# Patient Record
Sex: Female | Born: 1935 | Race: White | Hispanic: No | Smoking: Never smoker
Health system: Southern US, Community
[De-identification: ages and names within clinical notes are randomized; demographics above are authoritative.]

## PROBLEM LIST (undated history)

## (undated) DIAGNOSIS — T8859XA Other complications of anesthesia, initial encounter: Secondary | ICD-10-CM

## (undated) DIAGNOSIS — T7840XA Allergy, unspecified, initial encounter: Secondary | ICD-10-CM

## (undated) DIAGNOSIS — Q2730 Arteriovenous malformation, site unspecified: Secondary | ICD-10-CM

## (undated) DIAGNOSIS — K449 Diaphragmatic hernia without obstruction or gangrene: Secondary | ICD-10-CM

## (undated) DIAGNOSIS — I10 Essential (primary) hypertension: Secondary | ICD-10-CM

## (undated) DIAGNOSIS — H9190 Unspecified hearing loss, unspecified ear: Secondary | ICD-10-CM

## (undated) DIAGNOSIS — K219 Gastro-esophageal reflux disease without esophagitis: Secondary | ICD-10-CM

## (undated) DIAGNOSIS — D509 Iron deficiency anemia, unspecified: Principal | ICD-10-CM

## (undated) DIAGNOSIS — M1990 Unspecified osteoarthritis, unspecified site: Secondary | ICD-10-CM

## (undated) DIAGNOSIS — Z87442 Personal history of urinary calculi: Secondary | ICD-10-CM

## (undated) DIAGNOSIS — K2980 Duodenitis without bleeding: Secondary | ICD-10-CM

## (undated) DIAGNOSIS — T4145XA Adverse effect of unspecified anesthetic, initial encounter: Secondary | ICD-10-CM

## (undated) DIAGNOSIS — D649 Anemia, unspecified: Secondary | ICD-10-CM

## (undated) DIAGNOSIS — K269 Duodenal ulcer, unspecified as acute or chronic, without hemorrhage or perforation: Secondary | ICD-10-CM

## (undated) HISTORY — DX: Essential (primary) hypertension: I10

## (undated) HISTORY — DX: Arteriovenous malformation, site unspecified: Q27.30

## (undated) HISTORY — DX: Anemia, unspecified: D64.9

## (undated) HISTORY — DX: Diaphragmatic hernia without obstruction or gangrene: K44.9

## (undated) HISTORY — PX: CHOLECYSTECTOMY: SHX55

## (undated) HISTORY — DX: Iron deficiency anemia, unspecified: D50.9

## (undated) HISTORY — DX: Duodenal ulcer, unspecified as acute or chronic, without hemorrhage or perforation: K26.9

## (undated) HISTORY — DX: Allergy, unspecified, initial encounter: T78.40XA

## (undated) HISTORY — PX: TONSILLECTOMY: SUR1361

## (undated) HISTORY — DX: Hypercalcemia: E83.52

## (undated) HISTORY — PX: CATARACT EXTRACTION: SUR2

## (undated) HISTORY — DX: Duodenitis without bleeding: K29.80

---

## 2004-06-12 LAB — HM COLONOSCOPY

## 2007-09-07 ENCOUNTER — Ambulatory Visit (HOSPITAL_COMMUNITY): Admission: RE | Admit: 2007-09-07 | Discharge: 2007-09-07 | Payer: Self-pay | Admitting: Obstetrics and Gynecology

## 2009-03-12 ENCOUNTER — Ambulatory Visit (HOSPITAL_COMMUNITY): Admission: RE | Admit: 2009-03-12 | Discharge: 2009-03-12 | Payer: Self-pay | Admitting: Obstetrics and Gynecology

## 2010-11-02 ENCOUNTER — Encounter: Payer: Self-pay | Admitting: Obstetrics and Gynecology

## 2011-04-16 ENCOUNTER — Encounter: Payer: Self-pay | Admitting: Family Medicine

## 2011-04-16 DIAGNOSIS — K449 Diaphragmatic hernia without obstruction or gangrene: Secondary | ICD-10-CM | POA: Insufficient documentation

## 2011-04-16 DIAGNOSIS — I10 Essential (primary) hypertension: Secondary | ICD-10-CM | POA: Insufficient documentation

## 2011-04-16 DIAGNOSIS — T7840XA Allergy, unspecified, initial encounter: Secondary | ICD-10-CM | POA: Insufficient documentation

## 2011-04-16 DIAGNOSIS — H269 Unspecified cataract: Secondary | ICD-10-CM | POA: Insufficient documentation

## 2011-08-22 ENCOUNTER — Observation Stay (HOSPITAL_COMMUNITY)
Admission: EM | Admit: 2011-08-22 | Discharge: 2011-08-24 | Disposition: A | Discharge status: Home / Self Care | Payer: Medicare Other | Attending: Internal Medicine | Admitting: Internal Medicine

## 2011-08-22 ENCOUNTER — Encounter (HOSPITAL_COMMUNITY): Payer: Self-pay | Admitting: *Deleted

## 2011-08-22 DIAGNOSIS — N840 Polyp of corpus uteri: Secondary | ICD-10-CM | POA: Insufficient documentation

## 2011-08-22 DIAGNOSIS — I10 Essential (primary) hypertension: Secondary | ICD-10-CM | POA: Insufficient documentation

## 2011-08-22 DIAGNOSIS — D509 Iron deficiency anemia, unspecified: Principal | ICD-10-CM | POA: Insufficient documentation

## 2011-08-22 DIAGNOSIS — K5900 Constipation, unspecified: Secondary | ICD-10-CM | POA: Insufficient documentation

## 2011-08-22 DIAGNOSIS — K449 Diaphragmatic hernia without obstruction or gangrene: Secondary | ICD-10-CM | POA: Insufficient documentation

## 2011-08-22 DIAGNOSIS — R42 Dizziness and giddiness: Secondary | ICD-10-CM | POA: Insufficient documentation

## 2011-08-22 DIAGNOSIS — M1990 Unspecified osteoarthritis, unspecified site: Secondary | ICD-10-CM | POA: Diagnosis present

## 2011-08-22 DIAGNOSIS — H269 Unspecified cataract: Secondary | ICD-10-CM | POA: Insufficient documentation

## 2011-08-22 DIAGNOSIS — D649 Anemia, unspecified: Secondary | ICD-10-CM

## 2011-08-22 DIAGNOSIS — M129 Arthropathy, unspecified: Secondary | ICD-10-CM | POA: Insufficient documentation

## 2011-08-22 LAB — CBC
HCT: 22.5 % — ABNORMAL LOW (ref 36.0–46.0)
Hemoglobin: 7.4 g/dL — ABNORMAL LOW (ref 12.0–15.0)
Hemoglobin: 6.8 g/dL — CL (ref 12.0–15.0)
MCH: 22.6 pg — ABNORMAL LOW (ref 26.0–34.0)
MCHC: 30.6 g/dL (ref 30.0–36.0)
MCHC: 30.2 g/dL (ref 30.0–36.0)
MCV: 74 fL — ABNORMAL LOW (ref 78.0–100.0)
RBC: 3.27 MIL/uL — ABNORMAL LOW (ref 3.87–5.11)
RBC: 3.03 MIL/uL — ABNORMAL LOW (ref 3.87–5.11)
RDW: 15.7 % — ABNORMAL HIGH (ref 11.5–15.5)
WBC: 6.4 10*3/uL (ref 4.0–10.5)

## 2011-08-22 LAB — COMPREHENSIVE METABOLIC PANEL
AST: 15 U/L (ref 0–37)
Albumin: 3.4 g/dL — ABNORMAL LOW (ref 3.5–5.2)
Alkaline Phosphatase: 78 U/L (ref 39–117)
BUN: 15 mg/dL (ref 6–23)
Calcium: 10.1 mg/dL (ref 8.4–10.5)
Chloride: 104 mEq/L (ref 96–112)
Creatinine, Ser: 0.78 mg/dL (ref 0.50–1.10)
Glucose, Bld: 119 mg/dL — ABNORMAL HIGH (ref 70–99)
Potassium: 3.6 mEq/L (ref 3.5–5.1)
Sodium: 140 mEq/L (ref 135–145)

## 2011-08-22 LAB — DIFFERENTIAL: Basophils Absolute: 0.1 10*3/uL (ref 0.0–0.1)

## 2011-08-22 LAB — PREPARE RBC (CROSSMATCH)

## 2011-08-22 LAB — PROTIME-INR
INR: 0.98 (ref 0.00–1.49)
Prothrombin Time: 13.2 seconds (ref 11.6–15.2)

## 2011-08-22 LAB — ABO/RH: ABO/RH(D): O POS

## 2011-08-22 LAB — OCCULT BLOOD, POC DEVICE: Fecal Occult Bld: NEGATIVE

## 2011-08-22 MED ORDER — PANTOPRAZOLE SODIUM 40 MG PO TBEC
40.0000 mg | DELAYED_RELEASE_TABLET | Freq: Every day | ORAL | Status: DC
  Administered 2011-08-23: 40 mg via ORAL
  Filled 2011-08-22 (×2): qty 1

## 2011-08-22 MED ORDER — SODIUM CHLORIDE 0.9 % IV SOLN
INTRAVENOUS | Status: AC
  Administered 2011-08-23: via INTRAVENOUS

## 2011-08-22 MED ORDER — ACETAMINOPHEN 325 MG PO TABS: 650.0000 mg | ORAL_TABLET | Freq: Four times a day (QID) | ORAL | Status: DC | PRN

## 2011-08-22 MED ORDER — ONDANSETRON HCL 4 MG/2ML IJ SOLN: 4.0000 mg | Freq: Four times a day (QID) | INTRAMUSCULAR | Status: DC | PRN

## 2011-08-22 MED ORDER — THERA M PLUS PO TABS
1.0000 | ORAL_TABLET | Freq: Every day | ORAL | Status: DC
  Administered 2011-08-23 – 2011-08-24 (×2): 1 via ORAL
  Filled 2011-08-22 (×4): qty 1

## 2011-08-22 MED ORDER — CALCIUM CARBONATE 600 MG PO TABS: 600.0000 mg | ORAL_TABLET | Freq: Two times a day (BID) | ORAL | Status: DC

## 2011-08-22 MED ORDER — MORPHINE SULFATE 2 MG/ML IJ SOLN: 1.0000 mg | INTRAMUSCULAR | Status: DC | PRN

## 2011-08-22 MED ORDER — ACETAMINOPHEN 325 MG PO TABS: 650.0000 mg | ORAL_TABLET | Freq: Once | ORAL | Status: DC

## 2011-08-22 MED ORDER — CALCIUM CARBONATE 1250 (500 CA) MG PO TABS
1000.0000 mg | ORAL_TABLET | Freq: Two times a day (BID) | ORAL | Status: DC
  Administered 2011-08-23 – 2011-08-24 (×3): 1000 mg via ORAL
  Filled 2011-08-22 (×5): qty 2

## 2011-08-22 MED ORDER — ONDANSETRON HCL 4 MG PO TABS: 4.0000 mg | ORAL_TABLET | Freq: Four times a day (QID) | ORAL | Status: DC | PRN

## 2011-08-22 MED ORDER — ACETAMINOPHEN 650 MG RE SUPP: 650.0000 mg | Freq: Four times a day (QID) | RECTAL | Status: DC | PRN

## 2011-08-22 MED ORDER — DOCUSATE SODIUM 100 MG PO CAPS
100.0000 mg | ORAL_CAPSULE | Freq: Two times a day (BID) | ORAL | Status: DC | PRN
  Filled 2011-08-22: qty 1

## 2011-08-22 MED ORDER — POLYETHYLENE GLYCOL 3350 17 G PO PACK
17.0000 g | PACK | Freq: Every day | ORAL | Status: DC
  Administered 2011-08-23 – 2011-08-24 (×3): 17 g via ORAL
  Filled 2011-08-22 (×3): qty 1

## 2011-08-22 NOTE — ED Notes (Signed)
Patient is resting comfortably. 

## 2011-08-22 NOTE — ED Provider Notes (Signed)
History     CSN: [id number] Arrival date & time: 08/22/2011 10:45 AM   First MD Initiated Contact with Patient 08/22/11 1108      Chief Complaint  Patient presents with  . Anemia  . Dizziness    (Consider location/radiation/quality/duration/timing/severity/associated sxs/prior treatment) HPI Comments: Patient is a rambling historian and difficult to keep on track.  Here after having been seen at PCP yesterday, had blood drawn and was notified today by lab that her HGB was low at 7. Something.  She states that she has a long history of dizziness related to inner ear problems, and denies abdominal pain, rectal bleeding, vaginal bleeding, headache, nausea, vomiting, fever, chills, chest pain or shortness of breath  Patient is a 76 y.o. female presenting with anemia. The history is provided by the patient and the spouse. No language interpreter was used.  Anemia This is a new problem. The current episode started today. The problem occurs constantly. The problem has been unchanged. Associated symptoms include vertigo and weakness. Pertinent negatives include no abdominal pain, anorexia, change in bowel habit, coughing, fatigue, headaches, nausea, numbness, rash, urinary symptoms or visual change. The symptoms are aggravated by nothing. She has tried nothing for the symptoms.    Past Medical History  Diagnosis Date  . Hypertension   . Allergy   . Hiatal hernia   . Anemia   . Cataract   . Diverticula of colon     Past Surgical History  Procedure Date  . Cholecystectomy   . Tonsillectomy     No family history on file.  History  Substance Use Topics  . Smoking status: Never Smoker   . Smokeless tobacco: Not on file  . Alcohol Use: No    OB History    Grav Para Term Preterm Abortions TAB SAB Ect Mult Living                  Review of Systems  Constitutional: Negative for fatigue.  HENT: Negative.   Eyes: Negative.   Respiratory: Negative.  Negative for cough.     Cardiovascular: Negative.   Gastrointestinal: Negative.  Negative for nausea, abdominal pain, anorexia and change in bowel habit.  Genitourinary: Negative.   Musculoskeletal: Negative.   Skin: Negative.  Negative for rash.  Neurological: Positive for vertigo and weakness. Negative for numbness and headaches.  Hematological: Negative.   Psychiatric/Behavioral: Negative.     Allergies  Keflex; Levaquin; and Other  Home Medications   Current Outpatient Rx  Name Route Sig Dispense Refill  . ASPIRIN 81 MG PO TABS Oral Take 81 mg by mouth daily.      Marland Kitchen CALCIUM CARBONATE 600 MG PO TABS Oral Take 600 mg by mouth 2 (two) times daily with a meal.      . DOCUSATE SODIUM 100 MG PO CAPS Oral Take 100 mg by mouth 2 (two) times daily as needed. For constipation     . METOPROLOL TARTRATE 50 MG PO TABS Oral Take 50 mg by mouth 2 (two) times daily.      Marland Kitchen POLYSACCHARIDE IRON 150 MG PO CAPS Oral Take 150 mg by mouth 2 (two) times daily.        BP 138/70  Pulse 83  Temp(Src) 98.2 F (36.8 C) (Oral)  Resp 20  SpO2 98%  Physical Exam  Constitutional: She is oriented to person, place, and time. She appears well-developed and well-nourished.  HENT:  Head: Normocephalic and atraumatic.  Right Ear: External ear normal.  Left Ear:  External ear normal.  Nose: Nose normal.  Eyes: EOM are normal. Pupils are equal, round, and reactive to light. No scleral icterus.       Pale conjunctiva  Neck: Normal range of motion. Neck supple.  Cardiovascular: Normal rate, regular rhythm and normal heart sounds.  Exam reveals no gallop and no friction rub.   No murmur heard. Pulmonary/Chest: Effort normal and breath sounds normal. No respiratory distress.  Abdominal: Soft. Bowel sounds are normal. She exhibits no distension.  Genitourinary: Rectal exam shows external hemorrhoid.  Musculoskeletal: Normal range of motion.  Neurological: She is alert and oriented to person, place, and time.  Skin: Skin is warm and  dry. She is not diaphoretic. There is pallor.  Psychiatric: She has a normal mood and affect. Her behavior is normal. Judgment and thought content normal.    ED Course  Procedures (including critical care time)   Labs Reviewed  CBC  DIFFERENTIAL  COMPREHENSIVE METABOLIC PANEL  OCCULT BLOOD X 1 CARD TO LAB, STOOL  TYPE AND SCREEN   No results found.   No diagnosis found.    MDM  Patient with pale conjunctiva, history of anemia - reports has had colonoscopy in past for review of this, denies any symptoms at this time - is on iron supplementation.  Will get labs, patient has forewarned me that she does not wish to have a transfusion at this time.        Izola Price [place], [place] 08/22/11 1243  Patient is symptomatic with anemia.  Unsure of the chronicity of this but feel that she should be admitted.  Patient has been seen with Dr. Denton Lank.  Will admit to medicine.  Izola Price [place], [place] 08/22/11 1506

## 2011-08-22 NOTE — ED Notes (Signed)
Admitting MD at the patient bedside 

## 2011-08-22 NOTE — ED Notes (Signed)
Pt was told to come to the emergency dept.  Pt had check up yesterday and had being having some dizziness.  Pt sts she has ear problems too.  Pt started iron pills 2 years ago.  No active bleeding that patient knows of.  Pt was told hemoglobin was in the 7s.

## 2011-08-22 NOTE — ED Notes (Signed)
Melanie [name] [phone]

## 2011-08-22 NOTE — H&P (Signed)
PCP:   Leo Grosser, MD, MD   Chief Complaint:  Dizziness, lightheadedness, abnormal labwork  HPI: 76 year old female with a past medical history significant for hypertension, hiatal hernia, arthritis, and history of colon diverticulosis; who came into the hospital secondary to abnormal lab work at primary care physician office (low hemoglobin level); and also symptoms of fatigue and dizziness. Of note patient denies chest pain, shortness of breath, abdominal pain, nausea/vomiting, hematemesis, hematochezia or melena. She also endorses that do to inner ear problems she has been feeling dizzy and woozy for many months now and his here because PCP office advise her to come for further evaluation and treatment.   Allergies:   Allergies  Allergen Reactions  . Keflex   . Levaquin   . Other     Another antibiotic that starts with cina?      Past Medical History  Diagnosis Date  . Hypertension   . Allergy   . Hiatal hernia   . Anemia   . Cataract   . Diverticula of colon     Past Surgical History  Procedure Date  . Cholecystectomy   . Tonsillectomy     Prior to Admission medications   Medication Sig Start Date End Date Taking? Authorizing Provider  aspirin 81 MG tablet Take 81 mg by mouth daily.     Yes Historical Provider, MD  calcium carbonate (OS-CAL) 600 MG TABS Take 600 mg by mouth 2 (two) times daily with a meal.     Yes Historical Provider, MD  docusate sodium (COLACE) 100 MG capsule Take 100 mg by mouth 2 (two) times daily as needed. For constipation    Yes Historical Provider, MD  metoprolol (LOPRESSOR) 50 MG tablet Take 50 mg by mouth 2 (two) times daily.     Yes Historical Provider, MD  polysaccharide iron (NIFEREX) 150 MG CAPS capsule Take 150 mg by mouth 2 (two) times daily.     Yes Historical Provider, MD    Social History:  reports that she has never smoked. She does not have any smokeless tobacco history on file. She reports that she does not drink alcohol  or use illicit drugs.  No family history on file.  Review of Systems:  Constitutional: Denies fever, chills, diaphoresis, appetite change and fatigue.  HEENT: Denies photophobia, eye pain, redness, hearing loss, ear pain, congestion, sore throat, rhinorrhea, sneezing, mouth sores, trouble swallowing, neck pain, neck stiffness and tinnitus.   Respiratory: Denies SOB, DOE, cough, chest tightness,  and wheezing.   Cardiovascular: Denies chest pain, palpitations and leg swelling.  Gastrointestinal: Denies nausea, vomiting, abdominal pain, diarrhea, constipation, blood in stool and abdominal distention.  Genitourinary: Denies dysuria, urgency, frequency, hematuria, flank pain and difficulty urinating.  Musculoskeletal: Denies myalgias, back pain, joint swelling, arthralgias and gait problem.  Skin: Denies pallor, rash and wound.  Neurological: Denies dizziness, seizures, syncope, weakness, light-headedness, numbness and headaches.  Hematological: Denies adenopathy. Easy bruising, personal or family bleeding history  Psychiatric/Behavioral: Denies suicidal ideation, mood changes, confusion, nervousness, sleep disturbance and agitation   Physical Exam: Blood pressure 145/75, pulse 87, temperature 98.2 F (36.8 C), temperature source Oral, resp. rate 13, SpO2 97.00%. GEN: Lying comfortable in bed, no acute distress, cooperative to examination. HEENT: Normocephalic, atraumatic, eyes with equally rounded pupils to light and accommodation; extraocular muscles intact. Pale conjunctiva, no akinesia, moist mucous membranes. Neck: No bruits, supple, no thyromegaly. Resp: Clear to auscultation bilaterally. Heart: S1 and S2 appreciated, regular rate and rhythm, no murmurs, no gallops or  rubs. Abdomen: Soft, nontender, nondistended, positive bowel sounds. Ext: Trace of edema bilaterally, no tenderness on palpation, no cyanosis or clubbing; good bilateral pedal pulses. Skin: No bruises, no rash or  petechiae. Neuro: Unaided awake and oriented x3,  CN 2-12 grossly intact, muscle strength 5 out of 5 bilaterally and symmetrically, no focal neurologic deficit.    Labs on Admission:  Results for orders placed during the hospital encounter of 08/22/11 (from the past 48 hour(s))  CBC     Status: Abnormal   Collection Time   08/22/11 12:05 PM      Component Value Range Comment   WBC 6.4  4.0 - 10.5 (K/uL)    RBC 3.27 (*) 3.87 - 5.11 (MIL/uL)    Hemoglobin 7.4 (*) 12.0 - 15.0 (g/dL)    HCT 78.2 (*) 95.6 - 46.0 (%)    MCV 74.0 (*) 78.0 - 100.0 (fL)    MCH 22.6 (*) 26.0 - 34.0 (pg)    MCHC 30.6  30.0 - 36.0 (g/dL)    RDW 21.3 (*) 08.6 - 15.5 (%)    Platelets 415 (*) 150 - 400 (K/uL)   DIFFERENTIAL     Status: Normal   Collection Time   08/22/11 12:05 PM      Component Value Range Comment   Neutrophils Relative 75  43 - 77 (%)    Lymphocytes Relative 14  12 - 46 (%)    Monocytes Relative 8  3 - 12 (%)    Eosinophils Relative 2  0 - 5 (%)    Basophils Relative 1  0 - 1 (%)    Neutro Abs 4.8  1.7 - 7.7 (K/uL)    Lymphs Abs 0.9  0.7 - 4.0 (K/uL)    Monocytes Absolute 0.5  0.1 - 1.0 (K/uL)    Eosinophils Absolute 0.1  0.0 - 0.7 (K/uL)    Basophils Absolute 0.1  0.0 - 0.1 (K/uL)    RBC Morphology POLYCHROMASIA PRESENT     COMPREHENSIVE METABOLIC PANEL     Status: Abnormal   Collection Time   08/22/11 12:05 PM      Component Value Range Comment   Sodium 140  135 - 145 (mEq/L)    Potassium 3.6  3.5 - 5.1 (mEq/L)    Chloride 104  96 - 112 (mEq/L)    CO2 25  19 - 32 (mEq/L)    Glucose, Bld 119 (*) 70 - 99 (mg/dL)    BUN 15  6 - 23 (mg/dL)    Creatinine, Ser 5.78  0.50 - 1.10 (mg/dL)    Calcium 46.9  8.4 - 10.5 (mg/dL)    Total Protein 6.9  6.0 - 8.3 (g/dL)    Albumin 3.4 (*) 3.5 - 5.2 (g/dL)    AST 15  0 - 37 (U/L)    ALT 8  0 - 35 (U/L)    Alkaline Phosphatase 78  39 - 117 (U/L)    Total Bilirubin 0.2 (*) 0.3 - 1.2 (mg/dL)    GFR calc non Af Amer 80 (*) >90 (mL/min)    GFR calc  Af Amer >90  >90 (mL/min)   TYPE AND SCREEN     Status: Normal   Collection Time   08/22/11 12:25 PM      Component Value Range Comment   ABO/RH(D) O POS      Antibody Screen NEG      Sample Expiration 08/25/2011     OCCULT BLOOD, POC DEVICE     Status: Normal  Collection Time   08/22/11 12:32 PM      Component Value Range Comment   Fecal Occult Bld NEGATIVE       Radiological Exams on Admission: No results found.   Assessment/Plan 1-Anemia: Appears to be a chronic problem and currently w/o signs of active bleeding (FOBT negative and normal BUN). She is hemodynamically stable and her Hgb is 7.4; will admit to telemetry, check orthostatic vital signs, provide gentle fluid resuscitation, repeat H/H tonight and CBC in the morning. Will also check a TSH, and anemia panel, and depending patient's results she might benefit of iron infusion and probably PRBC's transfusion. Depending her evolution will decide regarding consulting GI as an inpatient. But she definitely needs a follow up screening colonoscopy at some point.  2-Hypertension: Stable at this point. Due to concerns of active GI bleed, will hold off on antihypertensive drugs and follow her vital signs.  3-Hiatal hernia/with some gastroesophageal disease: Will use PPI.  4-Arthritis: Will use Tylenol for pain control.  5-Dizziness: maybe a chronic problems as stated by the patient inner ear disease (perphaps vertigo vs Meniere vs labyrinthitis); or due to her current anemia level. Will workup her anemia, get orthostatic vital signs and treat accordingly as needed for other causes.   Time Spent on Admission: 50 minutes  Leala Bryand 08/22/2011, 4:25 PM

## 2011-08-23 DIAGNOSIS — D509 Iron deficiency anemia, unspecified: Secondary | ICD-10-CM

## 2011-08-23 LAB — CBC
HCT: 27.8 % — ABNORMAL LOW (ref 36.0–46.0)
Hemoglobin: 8.6 g/dL — ABNORMAL LOW (ref 12.0–15.0)
MCH: 23.2 pg — ABNORMAL LOW (ref 26.0–34.0)
MCHC: 30.9 g/dL (ref 30.0–36.0)
MCV: 75.1 fL — ABNORMAL LOW (ref 78.0–100.0)
Platelets: 371 10*3/uL (ref 150–400)
RBC: 3.7 MIL/uL — ABNORMAL LOW (ref 3.87–5.11)
RDW: 16.2 % — ABNORMAL HIGH (ref 11.5–15.5)
WBC: 7 10*3/uL (ref 4.0–10.5)

## 2011-08-23 LAB — IRON AND TIBC
Iron: 12 ug/dL — ABNORMAL LOW (ref 42–135)
TIBC: 405 ug/dL (ref 250–470)
UIBC: 393 ug/dL (ref 125–400)

## 2011-08-23 LAB — FOLATE: Folate: 17.4 ng/mL

## 2011-08-23 LAB — FERRITIN: Ferritin: 2 ng/mL — ABNORMAL LOW (ref 10–291)

## 2011-08-23 LAB — TSH: TSH: 1.787 u[IU]/mL (ref 0.350–4.500)

## 2011-08-23 LAB — PREPARE RBC (CROSSMATCH)

## 2011-08-23 MED ORDER — FUROSEMIDE 10 MG/ML IJ SOLN
20.0000 mg | Freq: Once | INTRAMUSCULAR | Status: AC
  Administered 2011-08-23: 20 mg via INTRAVENOUS
  Filled 2011-08-23: qty 2

## 2011-08-23 NOTE — Progress Notes (Signed)
Informed pt that her Hgb had dropped to 6.8 and the provider on call had ordered for a transfusion of 1 unit PRBCs.  Pt stated that her dr would come and discuss this with her before the decision was made.  RN explained to pt that her primary MD was not in the building, and would not be in the building until tomorrow, and that the provider on call had ordered this.  She was very hesitant about the transfusion.  She ultimately refused to receive the blood until her MD discussed it with her further.  Refusal form signed, provider on call made aware.  Blood bank made aware.  Made pt aware that she could rescind her refusal tomorrow, if she wished.  Will monitor.

## 2011-08-23 NOTE — Consult Note (Signed)
Referring Provider: Mendez,CarlosPrimary Care Physician:  Leo Grosser, MD, MD Primary Gastroenterologist: Gentry Fitz  Reason for Consultation:  Severe microcytic anemia  HPI: Melanie [name] is a 76 y.o. female with history of hypertension osteoarthritis and previously documented diverticulosis. She apparently also has history of GERD. She is status post cholecystectomy. She and her husband moved here from [place] recently and she tells me she is under extreme personal and financial stress. She had requested refills on medications from her primary care provider and was told that she could not have the midline she came in for labs. At that time she was found to have a hemoglobin of 7.4 and MCV of 74 and admission was advised for transfusions. She had complained of lightheadedness but now on further questioning says she has had intermittent lightheadedness for a long time. She was found to be Hemoccult-negative on admission. Patient is a rambling historian, somewhat tangential it appears that she does have prior history of iron deficiency anemia and apparently had taken tandem in the past the last about 3 years ago. She said she stopped taking it because she couldn't afford it. She had not been on any recent iron supplementation. I cannot find any recent hemoglobins other than those done in the past few days. She denies any abdominal pain, says she has history of problems with constipation since she was born and manages this usually with Colace and do collapse and periodic enemas. Her appetite has been fair her weight has been stable she denies any problems with dysphagia or odynophagia. She has not noted any melena or hematochezia.  She relates that she had prior colonoscopy and [place] done in 2004 and was found to have hemorrhoids and diverticulosis. She says she had an upper endoscopy in 2003 around the same time that she had her gallbladder out but does not recall findings on that.  She does  take one baby aspirin daily prophylactically, no regular NSAIDs. Her family history is negative for colon cancer polyps as far she is aware  The patient does not want to stay in the hospital to undergo procedures and once to take go home to take care of her personal issues. She also relates that she had some sort of prolonged confusion after she had the anesthesia for her colonoscopy previously once to be sure that she has something different for sedation this time. She is agreeable to having her procedures done outpatient.    Past Medical History  Diagnosis Date  . Hypertension   . Allergy   . Hiatal hernia   . Anemia   . Cataract   . Diverticula of colon     Past Surgical History  Procedure Date  . Cholecystectomy   . Tonsillectomy     Prior to Admission medications   Medication Sig Start Date End Date Taking? Authorizing Provider  aspirin 81 MG tablet Take 81 mg by mouth daily.     Yes Historical Provider, MD  calcium carbonate (OS-CAL) 600 MG TABS Take 600 mg by mouth 2 (two) times daily with a meal.     Yes Historical Provider, MD  docusate sodium (COLACE) 100 MG capsule Take 100 mg by mouth 2 (two) times daily as needed. For constipation    Yes Historical Provider, MD  metoprolol (LOPRESSOR) 50 MG tablet Take 50 mg by mouth 2 (two) times daily.     Yes Historical Provider, MD  polysaccharide iron (NIFEREX) 150 MG CAPS capsule Take 150 mg by mouth 2 (two) times  daily.     Yes Historical Provider, MD    Current Facility-Administered Medications  Medication Dose Route Frequency Provider Last Rate Last Dose  . 0.9 %  sodium chloride infusion   Intravenous Continuous Vassie Loll, MD 75 mL/hr at 08/23/11 0002    . acetaminophen (TYLENOL) tablet 650 mg  650 mg Oral Q6H PRN Vassie Loll, MD       Or  . acetaminophen (TYLENOL) suppository 650 mg  650 mg Rectal Q6H PRN Vassie Loll, MD      . acetaminophen (TYLENOL) tablet 650 mg  650 mg Oral Once Rolan Lipa      .  calcium carbonate (OS-CAL - dosed in mg of elemental calcium) tablet 1,000 mg of elemental calcium  1,000 mg of elemental calcium Oral BID WC Mickeal Skinner, PHARMD   1,000 mg of elemental calcium at 08/23/11 0828  . docusate sodium (COLACE) capsule 100 mg  100 mg Oral BID PRN Vassie Loll, MD      . furosemide (LASIX) injection 20 mg  20 mg Intravenous Once Vassie Loll, MD      . morphine 2 MG/ML injection 1 mg  1 mg Intravenous Q4H PRN Vassie Loll, MD      . multivitamins ther. w/minerals tablet 1 tablet  1 tablet Oral Daily Vassie Loll, MD   1 tablet at 08/23/11 1000  . ondansetron (ZOFRAN) tablet 4 mg  4 mg Oral Q6H PRN Vassie Loll, MD       Or  . ondansetron [facility]) injection 4 mg  4 mg Intravenous Q6H PRN Vassie Loll, MD      . pantoprazole (PROTONIX) EC tablet 40 mg  40 mg Oral Q1200 Vassie Loll, MD      . polyethylene glycol (MIRALAX / GLYCOLAX) packet 17 g  17 g Oral Daily Vassie Loll, MD   17 g at 08/23/11 1026  . DISCONTD: calcium carbonate (OS-CAL) tablet 600 mg  600 mg Oral BID WC Vassie Loll, MD        Allergies as of 08/22/2011 - Review Complete 08/22/2011  Allergen Reaction Noted  . Keflex  08/22/2011  . Levaquin  04/16/2011  . Other  08/22/2011    Social history;, patient is married she and her husband recently relocated from [place] she is a nonsmoker nondrinker excellent Family history is negative for colon cancer polyps she says her mother did have diverticular disease.  Review of Systems: Gen: Denies any fever, chills, sweats, anorexia, fatigue, weakness, CV: Denies chest pain, angina, palpitations, syncope, orthopnea, PND, peripheral edema, and claudication. Resp: Denies dyspnea at rest, dyspnea with exercise, cough, sputum, wheezing, coughing up blood, and pleurisy. GI:  As in history of present illness GU : Denies urinary burning, blood in urine, urinary frequency, urinary hesitancy, nocturnal urination, and urinary incontinence. MS: Positive  for arthritic symptoms particularly in her hands and her hips Derm: Denies rash, itching, dry skin, hives, moles, warts, or unhealing ulcers.  Psych: Denies depression, anxiety, memory loss, suicidal ideation, hallucinations, paranoia, and confusion., He says she has been very stressed recently Heme: Denies bruising, bleeding, and enlarged lymph nodes. Neuro:  Denies any headaches, dizziness, paresthesias.   Physical Exam: Vital signs in last 24 hours: Temp:  [97.8 F (36.6 C)-99 F (37.2 C)] 98.1 F (36.7 C) (11/11 0945) Pulse Rate:  [78-103] 103  (11/11 0945) Resp:  [13-22] 18  (11/11 0945) BP: (122-148)/(63-82) 143/77 mmHg (11/11 0945) SpO2:  [95 %-100 %] 95 % (11/11 0516) Weight:  [90.4  kg (199 lb 4.7 oz)] 199 lb 4.7 oz (90.4 kg) (11/10 1810) Last BM Date: 08/21/11 General:   Alert,  Well-developed, well-nourished,  in NAD, somewhat argumentative Head:  Normocephalic and atraumatic. Eyes:  Sclera clear, no icterus.   Conjunctiva pale Ears:  Normal auditory acuity. Nose:  No deformity, discharge,  or lesions. Mouth:  No deformity or lesions.  Oropharynx pink & moist. Neck:  Supple; no masses or thyromegaly. Lungs:  Clear throughout to auscultation.   No wheezes, crackles, or rhonchi. No acute distress. Heart:  Regular rate and rhythm; no murmurs, clicks, rubs,  or gallops. Abdomen:  Soft, nontender and nondistended. No masses, hepatosplenomegaly or hernias noted. Normal bowel sounds, without guarding, and without rebound.   Rectal:  Deferred until time of colonoscopy.   Msk:  Symmetrical without gross deformities. . Pulses:  Normal pulses noted. Extremities:  Without clubbing or edema. Neurologic:  Alert and  oriented x4;  grossly normal neurologically. Skin:  Intact without significant lesions or rashes.  Psych:  Alert and cooperative. Normal mood and affect.  Intake/Output from previous day: 11/10 0701 - 11/11 0700 In: 372.5 [I.V.:372.5] Out: -  Intake/Output this  shift: Total I/O In: 490 [P.O.:240; I.V.:250] Out: -   Lab Results:  [facility] [place] 08/22/11 2158 08/22/11 1205  WBC 6.7 6.4  HGB 6.8* 7.4*  HCT 22.5* 24.2*  PLT 388 415*   BMET  Basename 08/22/11 1205  NA 140  K 3.6  CL 104  CO2 25  GLUCOSE 119*  BUN 15  CREATININE 0.78  CALCIUM 10.1   LFT  Basename 08/22/11 1205  PROT 6.9  ALBUMIN 3.4*  AST 15  ALT 8  ALKPHOS 78  BILITOT 0.2*  BILIDIR --  IBILI --   PT/INR  Basename 08/22/11 2158  LABPROT 13.2  INR 0.98       Studies/Results:  Impression:  #72 76 year old female admitted with severe microcytic anemia, currently Hemoccult negative. Patient relates relates prior history of iron deficiency anemia though she is uncertain of the cause. Will need to rule out occult colon lesion, gastric lesion, AVMs., Also consider celiac disease if endoscopic evaluation is negative. #2 History of diverticulosis and chronic constipation #3 History of GERD  #4 osteoarthritis #5 hypertension  Plan: #1 Agree with transfusions to get hemoglobin at least in the 8 range  #2 patient has a new prescription for Nu iron which she has started one twice daily,, and should continue for at least 3 months with plans for followup iron studies at that time I #3 We will or range for outpatient colonoscopy and upper endoscopy with Dr. Melvia Heaps, and will plan for propofol sedation. Procedures were discussed with the patient and her husband and they are agreeable to this plan our office will call her tomorrow after she is discharged to home.   Melanie [name]  08/23/2011, 10:41 AM  Chart was reviewed and patient was examined. X-rays were reviewed.    I agree with management and plans.  Barbette Hair. Arlyce Dice, M.D., [facility]

## 2011-08-23 NOTE — Progress Notes (Signed)
Subjective: Denies CP, SOB, palpitations or any bloody BM. Hgb is 6.8 today and patient appears pale on exam.  Objective: Vital signs in last 24 hours: Temp:  [97.8 F (36.6 C)-99 F (37.2 C)] 98.4 F (36.9 C) (11/11 0516) Pulse Rate:  [78-99] 93  (11/11 0516) Resp:  [13-22] 18  (11/11 0516) BP: (122-148)/(63-82) 126/70 mmHg (11/11 0516) SpO2:  [95 %-100 %] 95 % (11/11 0516) Weight:  [90.4 kg (199 lb 4.7 oz)] 199 lb 4.7 oz (90.4 kg) (11/10 1810) Weight change:  Last BM Date: 08/21/11  Intake/Output from previous day: 11/10 0701 - 11/11 0700 In: 372.5 [I.V.:372.5] Out: -      Physical Exam: General: Pale, Alert, awake, oriented x3, in no acute distress. HEENT: No bruits, no goiter. Pale conjunctiva. Heart: Regular rate and rhythm, without murmurs, rubs, gallops. Lungs: Clear to auscultation bilaterally. Abdomen: Soft, nontender, nondistended, positive bowel sounds. Extremities: No clubbing cyanosis or edema with positive pedal pulses. Neuro: Grossly intact, nonfocal.  Lab Results: Basic Metabolic Panel:  Basename 08/22/11 1205  NA 140  K 3.6  CL 104  CO2 25  GLUCOSE 119*  BUN 15  CREATININE 0.78  CALCIUM 10.1  MG --  PHOS --   Liver Function Tests:  Basename 08/22/11 1205  AST 15  ALT 8  ALKPHOS 78  BILITOT 0.2*  PROT 6.9  ALBUMIN 3.4*   No results found for this basename: LIPASE:2,AMYLASE:2 in the last 72 hours No results found for this basename: AMMONIA:2 in the last 72 hours CBC:  Basename 08/22/11 2158 08/22/11 1205  WBC 6.7 6.4  NEUTROABS -- 4.8  HGB 6.8* 7.4*  HCT 22.5* 24.2*  MCV 74.3* 74.0*  PLT 388 415*   Anemia Panel:  Basename 08/22/11 2158  VITAMINB12 --  FOLATE --  FERRITIN --  TIBC --  IRON --  RETICCTPCT 2.9   Coagulation:  Basename 08/22/11 2158  LABPROT 13.2  INR 0.98   Studies/Results: No results found.  Medications: Scheduled Meds:   . acetaminophen  650 mg Oral Once  . calcium carbonate  1,000 mg of  elemental calcium Oral BID WC  . furosemide  20 mg Intravenous Once  . multivitamins ther. w/minerals  1 tablet Oral Daily  . pantoprazole  40 mg Oral Q1200  . polyethylene glycol  17 g Oral Daily  . DISCONTD: calcium carbonate  600 mg Oral BID WC   Continuous Infusions:   . sodium chloride 75 mL/hr at 08/23/11 0002   PRN Meds:.acetaminophen, acetaminophen, docusate sodium, morphine, ondansetron (ZOFRAN) IV, ondansetron  Assessment/Plan: 1-Anemia: Hgb 6.8 today. Will transfuse 2 units of PRBC's and will consult GI to arrange colonoscopy either as inpatient vs outpatient. No palpitations, no SOB.  2-Hypertension: Stable at this point. Will continue holding her antihypertensives for now.  3-Hiatal hernia/with some gastroesophageal disease: Continue protonix.   4-Arthritis: continue tylenol as needed for pain.  5-Dizziness: Patient denying any dizziness today.  Will continue monitoring.   LOS: 1 day   Melanie [name] 08/23/2011, 8:24 AM

## 2011-08-23 NOTE — Progress Notes (Signed)
CRITICAL VALUE ALERT  Critical value received:  Hgb 6.8  Date of notification:  08-22-11  Time of notification:  2300  Critical value read back:yes  Nurse who received alert:  Landry Mellow, RN  MD notified (1st page):  Benedetto Coons, NP  Time of first page:  2305  MD notified (2nd page):  Time of second page:  Responding MD:  Benedetto Coons, NP  Time MD responded:  [phone]

## 2011-08-24 LAB — TYPE AND SCREEN
ABO/RH(D): O POS
Unit division: 0
Unit division: 0

## 2011-08-24 LAB — CBC
Hemoglobin: 9.1 g/dL — ABNORMAL LOW (ref 12.0–15.0)
MCH: 23.3 pg — ABNORMAL LOW (ref 26.0–34.0)
MCV: 75.7 fL — ABNORMAL LOW (ref 78.0–100.0)
RBC: 3.91 MIL/uL (ref 3.87–5.11)

## 2011-08-24 MED ORDER — POLYETHYLENE GLYCOL 3350 17 G PO PACK: 17.0000 g | PACK | Freq: Every day | ORAL | Status: AC

## 2011-08-24 MED ORDER — THERA M PLUS PO TABS: 1.0000 | ORAL_TABLET | Freq: Every day | ORAL | Status: DC

## 2011-08-24 MED ORDER — PANTOPRAZOLE SODIUM 40 MG PO TBEC: 40.0000 mg | DELAYED_RELEASE_TABLET | Freq: Every day | ORAL | Status: DC

## 2011-08-24 MED ORDER — ACETAMINOPHEN 325 MG PO TABS: 650.0000 mg | ORAL_TABLET | Freq: Four times a day (QID) | ORAL | Status: AC | PRN

## 2011-08-24 NOTE — Progress Notes (Signed)
Nsg Discharge Note  Admit Date:  08/22/2011 Discharge date: 08/24/2011   Dahlia Client to be D/C'd Home per MD order.   with the patient and all questions fully answered.  Discharge Medication:  Dahlia Client  Home Medication Instructions EAV:[id number]   Printed on:08/24/11 1215  Medication Information                    metoprolol (LOPRESSOR) 50 MG tablet Take 50 mg by mouth 2 (two) times daily.             calcium carbonate (OS-CAL) 600 MG TABS Take 600 mg by mouth 2 (two) times daily with a meal.             polysaccharide iron (NIFEREX) 150 MG CAPS capsule Take 150 mg by mouth 2 (two) times daily.             docusate sodium (COLACE) 100 MG capsule Take 100 mg by mouth 2 (two) times daily as needed. For constipation            acetaminophen (TYLENOL) 325 MG tablet Take 2 tablets (650 mg total) by mouth every 6 (six) hours as needed for pain (or Fever >/= 101).           Multiple Vitamins-Minerals (MULTIVITAMINS THER. W/MINERALS) TABS Take 1 tablet by mouth daily.           pantoprazole (PROTONIX) 40 MG tablet Take 1 tablet (40 mg total) by mouth daily at 12 noon.           polyethylene glycol (MIRALAX / GLYCOLAX) packet Take 17 g by mouth daily.             Discharge Assessment: Filed Vitals:   08/24/11 0535  BP: 143/81  Pulse: 76  Temp: 98.3 F (36.8 C)  Resp: 18   Skin clean, dry and intact without evidence of skin break down, no evidence of skin tears noted. IV catheter discontinued intact. Site without signs and symptoms of complications. Dressing and pressure applied.  D/c Instructions-Education: Discharge instructions given to patient/family with verbalized understanding. D/c education completed with patient/family including follow up instructions, medication list, d/c activities limitations if indicated, with other d/c instructions as indicated by MD - patient able to verbalize understanding, all questions fully answered. Patient instructed to  return to ED, call 911, or call MD for any changes in condition.  Patient escorted via WC, and D/C home via private auto.  Gwendalyn Ege, RN 08/24/2011 12:15 PM

## 2011-08-24 NOTE — Progress Notes (Signed)
Utilization Review Completed.  Melanie [name]  08/24/2011 

## 2011-08-24 NOTE — Discharge Summary (Signed)
Physician Discharge Summary  Patient ID: Dahlia Client MRN: [id number] DOB/AGE: [birth year] 74 y.o.  Admit date: 08/22/2011 Discharge date: 08/24/2011  Primary Care Physician:  Leo Grosser, MD, MD   Discharge Diagnoses:   1-Anemia(iron deficiency) 2-Dizziness 3-HTN 4-Hiatal hernia with hx of GERD 5-Arthritis 6-Diverticula of colon 7-Cataract 8-Chronic constipation  Present on Admission:  -Anemia: symptomatic (dizziness, fatigue and lightheaded). -HTN    Current Discharge Medication List    START taking these medications   Details  acetaminophen (TYLENOL) 325 MG tablet Take 2 tablets (650 mg total) by mouth every 6 (six) hours as needed for pain (or Fever >/= 101). Qty: 30 tablet, Refills: 0    Multiple Vitamins-Minerals (MULTIVITAMINS THER. W/MINERALS) TABS Take 1 tablet by mouth daily. Qty: 30 each    pantoprazole (PROTONIX) 40 MG tablet Take 1 tablet (40 mg total) by mouth daily at 12 noon. Qty: 30 tablet, Refills: 0    polyethylene glycol (MIRALAX / GLYCOLAX) packet Take 17 g by mouth daily. Qty: 14 each, Refills: 1      CONTINUE these medications which have NOT CHANGED   Details  calcium carbonate (OS-CAL) 600 MG TABS Take 600 mg by mouth 2 (two) times daily with a meal.      docusate sodium (COLACE) 100 MG capsule Take 100 mg by mouth 2 (two) times daily as needed. For constipation     metoprolol (LOPRESSOR) 50 MG tablet Take 50 mg by mouth 2 (two) times daily.      polysaccharide iron (NIFEREX) 150 MG CAPS capsule Take 150 mg by mouth 2 (two) times daily.        STOP taking these medications     aspirin 81 MG tablet      ferrous fumarate-iron polysaccharide complex (TANDEM) 162-115.2 MG CAPS          Disposition and Follow-up: Patient has been discharge in stable and improved condition; currently w/o dizziness, lighthead feeling, CP, SOB or any other complaints. At discharge her Hgb was 8.6 and stable. She will follow with Dr. Tanya Nones  over the next 5-7 days in order to have her Hgb recheck and will also follow with Dr. Arlyce Dice (GI) in order to have colonoscopy and EGD as an outpatient.   Consults:   GI (Dr. Arlyce Dice)   Significant Diagnostic Studies:  No results found.   Brief H and P: For complete details please refer to admission H and P, but in brief 76 y/o female with pmh of HTN, arthritis and colon diverticula; admitted secondary to symptomatic anemia. Hgb on admission 7.4 with subsequent drop to 6.8. Patient was having intermittent dizziness, lightheadness and fatigue. FOBT was negative on admission. Patient was admitted for further evaluation and treatment.   Hospital Course:  1-Anemia(iron deficiency):stable now after 2 units of PRBC's. Patient will continue iron pills as prescribed and will follow with GI as an outpatient for colonoscopy and endoscopy. 2-Dizziness- negative orthostatics and now w/o any complaints after transfusion. She will follow with PCP in 5-7 days. 3-HTN: Stable. Will continue current regimen. Patient advised to follow low sodium diet. 4-Hiatal hernia with hx of GERD: she was started on PPI. EGD as an outpatient per GI. 5-Arthritis: patient instructed to minimize/advoid use of NSAID and to use tylenol for pain control. 6-Diverticula of colon: No signs of acute bleeding. She is going to follow low residue diet and will have colonoscopy as an outpatient. 7-Cataract: Continue current medications. 8-Chronic constipation: She will continue miralax and colace; also will drink plenty of  water and will increase the amount of diet in her diet.  Time spent on Discharge: 35 minutes.  Signed: Cornelis Kluver 08/24/2011, 11:17 AM

## 2011-08-25 ENCOUNTER — Telehealth: Payer: Self-pay

## 2011-08-25 NOTE — Telephone Encounter (Signed)
Pt scheduled for previsit for ECL on 08/27/11@1pm , ECL with propofol scheduled for 08/31/11@2 :30pm. Pt has iron def. Anemia. Left message for pt to call back for appt dates and times.

## 2011-08-26 NOTE — Telephone Encounter (Signed)
Pt called back and states that she has made arrangements to be seen at a different office and her appts need to be cancelled. Dr. Arlyce Dice aware.

## 2011-08-27 NOTE — ED Provider Notes (Signed)
Medical screening examination/treatment/procedure(s) were conducted as a shared visit with non-physician practitioner(s) and myself.  I personally evaluated the patient during the encounter   Suzi Roots, MD 08/27/11 1416

## 2011-08-31 ENCOUNTER — Encounter: Payer: Medicare Other | Admitting: Gastroenterology

## 2011-10-16 ENCOUNTER — Ambulatory Visit (HOSPITAL_COMMUNITY): Admission: RE | Admit: 2011-10-16 | Payer: Medicare Other | Source: Ambulatory Visit | Admitting: Gastroenterology

## 2011-10-16 ENCOUNTER — Encounter (HOSPITAL_COMMUNITY): Admission: RE | Payer: Self-pay | Source: Ambulatory Visit

## 2011-10-16 SURGERY — EGD (ESOPHAGOGASTRODUODENOSCOPY)
Anesthesia: Moderate Sedation

## 2011-10-19 ENCOUNTER — Other Ambulatory Visit (HOSPITAL_COMMUNITY): Payer: Self-pay | Admitting: *Deleted

## 2011-10-20 ENCOUNTER — Encounter (HOSPITAL_COMMUNITY)
Admission: RE | Admit: 2011-10-20 | Discharge: 2011-10-20 | Disposition: A | Discharge status: Home / Self Care | Payer: Medicare Other | Source: Ambulatory Visit | Attending: Family Medicine | Admitting: Family Medicine

## 2011-10-20 DIAGNOSIS — D509 Iron deficiency anemia, unspecified: Secondary | ICD-10-CM | POA: Insufficient documentation

## 2011-10-20 LAB — TYPE AND SCREEN
ABO/RH(D): O POS
Unit division: 0

## 2011-10-20 LAB — PREPARE RBC (CROSSMATCH)

## 2011-10-21 ENCOUNTER — Encounter (HOSPITAL_COMMUNITY)
Admission: RE | Admit: 2011-10-21 | Discharge: 2011-10-21 | Disposition: A | Discharge status: Home / Self Care | Payer: Medicare Other | Source: Ambulatory Visit | Attending: Family Medicine | Admitting: Family Medicine

## 2011-10-21 NOTE — Progress Notes (Signed)
Lungs clear to auscultation bilaterally. 

## 2011-10-26 ENCOUNTER — Telehealth: Payer: Self-pay | Admitting: Hematology and Oncology

## 2011-10-26 NOTE — Telephone Encounter (Signed)
S/W PT RE APPT FOR 1/18 @ 9:30 AM.

## 2011-10-27 ENCOUNTER — Telehealth: Payer: Self-pay | Admitting: Hematology and Oncology

## 2011-10-27 NOTE — Telephone Encounter (Signed)
Referred by Dr. Lacretia Nicks. Melanie [name] Dx- IDA

## 2011-10-30 ENCOUNTER — Ambulatory Visit: Payer: Medicare Other | Admitting: Hematology and Oncology

## 2011-10-30 ENCOUNTER — Ambulatory Visit: Payer: Medicare Other

## 2011-11-02 ENCOUNTER — Telehealth: Payer: Self-pay | Admitting: Hematology and Oncology

## 2011-11-02 NOTE — Telephone Encounter (Signed)
Pt returned my call to r/s 1/18 appt. Pt given appt for 1/25 @ 9:30 am.

## 2011-11-02 NOTE — Telephone Encounter (Signed)
Returned pt's call re r/s 1/18 appt and lm for pt asking that she call me back to r/s. This is a new pt appt so i cannot add appt w/o confirming w/pt.

## 2011-11-06 ENCOUNTER — Ambulatory Visit (HOSPITAL_BASED_OUTPATIENT_CLINIC_OR_DEPARTMENT_OTHER): Payer: Medicare Other | Admitting: Hematology and Oncology

## 2011-11-06 ENCOUNTER — Ambulatory Visit (HOSPITAL_BASED_OUTPATIENT_CLINIC_OR_DEPARTMENT_OTHER): Payer: Medicare Other

## 2011-11-06 ENCOUNTER — Ambulatory Visit: Payer: Medicare Other

## 2011-11-06 ENCOUNTER — Telehealth: Payer: Self-pay | Admitting: Hematology and Oncology

## 2011-11-06 VITALS — BP 145/85 | HR 102 | Temp 99.0°F | Ht 63.5 in | Wt 196.6 lb

## 2011-11-06 DIAGNOSIS — D539 Nutritional anemia, unspecified: Secondary | ICD-10-CM

## 2011-11-06 DIAGNOSIS — D509 Iron deficiency anemia, unspecified: Secondary | ICD-10-CM

## 2011-11-06 LAB — CBC WITH DIFFERENTIAL/PLATELET
BASO%: 0.2 % (ref 0.0–2.0)
Basophils Absolute: 0 10*3/uL (ref 0.0–0.1)
EOS%: 3.6 % (ref 0.0–7.0)
HGB: 10.9 g/dL — ABNORMAL LOW (ref 11.6–15.9)
MCH: 23.3 pg — ABNORMAL LOW (ref 25.1–34.0)
RBC: 4.7 10*6/uL (ref 3.70–5.45)
RDW: 22.7 % — ABNORMAL HIGH (ref 11.2–14.5)
lymph#: 1.1 10*3/uL (ref 0.9–3.3)

## 2011-11-06 LAB — COMPREHENSIVE METABOLIC PANEL
ALT: 18 U/L (ref 0–35)
AST: 23 U/L (ref 0–37)
Albumin: 4.5 g/dL (ref 3.5–5.2)
BUN: 15 mg/dL (ref 6–23)
Calcium: 10 mg/dL (ref 8.4–10.5)
Chloride: 105 mEq/L (ref 96–112)
Potassium: 4.3 mEq/L (ref 3.5–5.3)
Sodium: 140 mEq/L (ref 135–145)
Total Protein: 6.9 g/dL (ref 6.0–8.3)

## 2011-11-06 LAB — FERRITIN: Ferritin: 10 ng/mL (ref 10–291)

## 2011-11-06 NOTE — Telephone Encounter (Signed)
sent pt to lab.  called IR and they will call pt with Biopsy appt.  informed pt

## 2011-11-06 NOTE — Progress Notes (Signed)
CC:   Melanie Manges, MD Melanie Hawks Elnoria Howard, MD  IDENTIFYING STATEMENT:  The patient is a 77 year old woman seen at the request of Dr. Tanya Nones with anemia.  HISTORY OF PRESENT ILLNESS:  The patient recently relocated to the [place] area from [place].  She was admitted between 08/22/2011 and 08/24/2011 for microcytic anemia.  She had complained of intermittent lightheadedness.  She was found to be occult negative on admission.  She received 4 units of packed red blood cells.  She does recall having been told that she had been iron deficient on and off for the last 2 years. She was placed on iron but has been taking this intermittently over the years.  Following her admission, she resumed oral iron.  She describes a well-balanced diet with stable weight.  The patient was referred to Dr. Jeani Hawking as an outpatient.  On 09/15/2011, he performed an upper endoscopy that revealed a hiatal hernia.  Duodenal biopsies were obtained and showed no evidence of celiac sprue.  A colonoscopy on 10/01/2011 had shown diverticula in the descending and sigmoid colon.  A 3 mm, discrete, erythematous patch was found in the ascending colon.  However, Dr. Elnoria [name] felt it was not an AVM.  There was no evidence of inflammation, ulcerations, erosions, polyps, or masses.  Medium hemorrhoids were noted.  The patient was subsequently scheduled for capsule endoscopy to examine the small bowel, but she declined.  Dr. Tanya Nones had performed blood work in his office.  Results are as follows:   On 09/01/2011, white cell count 4.7, hemoglobin 9.5, hematocrit 32.5, platelets 430.  On 09/18/2011, white cell count 5, hemoglobin 8.5, hematocrit 28.7, platelets 443.  On 08/16/2011, white count 4.6, hemoglobin 7.8, hematocrit 25.8, platelets 408.  She is referred to Hematology for further evaluation.  PAST MEDICAL HISTORY: 1. Hypertension. 2. Hiatal hernia. 3. GERD. 4. Status post bilateral cataract extraction. 5.  Status post cholecystectomy. 6. History of allergic rhinitis.  ALLERGIES: 1. CODEINE. 2. KEFLEX. 3. LEVAQUIN. 4. MORPHINE. 5. SULFA.  SOCIAL HISTORY:  The patient is married with 3 children.  She is a housewife.  She denies alcohol or tobacco use.  FAMILY HISTORY:  Negative for oncologic or hematologic malignancies.  HEALTH MAINTENANCE:  Colonoscopy as noted above.  MEDICATIONS: 1. Ferrex 150 mg b.i.d. 2. Aspirin 81 mg daily. 3. Calcium with vitamin D. 4. Stool softener. 5. Nystatin p.r.n. 6. Multivitamins.  REVIEW OF SYSTEMS:  Constitutional:  She denies fever, chills, night sweats, anorexia, and weight loss.  GI:  She denies nausea, vomiting, abdominal pain, diarrhea, melena, or hematochezia.  GU:  She denies dysuria, hematuria, nocturia, and frequency.  Cardiovascular:  She denies chest pain, PND, orthopnea, and ankle swelling.  Respirations: She denies cough, hemoptysis, wheeze, and shortness of breath. Neurologic:  She denies headache, vision changes, and extremity weakness.  The rest of the review of systems is negative.  PHYSICAL EXAMINATION:  General Appearance:  The patient is alert and oriented x3.  Vital Signs:  Pulse 102.  Blood pressure 145/85. Temperature 99.  Respirations 20.  Weight 196 pounds.  HEENT:  Head is atraumatic, normocephalic.  Sclerae are anicteric.  Mouth is moist. Neck:  Supple.  Chest:  Clear.  CVS:  First and second heart sounds present.  Abdomen:  Soft, nontender.  Bowel sounds present. Extremities:  No edema.  Pulses are present and symmetrical.  Lymph Nodes:  No adenopathy.  CNS:  Nonfocal.  IMPRESSION AND PLAN:  [name] is a 77 year old  woman with a history of iron-deficiency anemia.  She has had a colonoscopy and endoscopy which were both unremarkable.  She has refused capsule endoscopy. She is considering a second gastrointestinal consultation at a tertiary hospital.  We spent some time discussing the possible etiologies  for iron-deficiency anemia.  She was told that she may have an issue with her small bowel and, thus, it was imperative that she undergo, at some point, a small bowel exam as this could be a source of  gastrointestinal bleed.  From the hematologic standpoint, we will repeat some  Blood, especially iron stores.  If her iron levels are low, she would be a candidate for intravenous iron in the form of Feraheme.  She was told that she may require this intermittently until the source of blood loss was identified and treated. We discussed logistics and side effects of therapy.  In addition to obtaining an anemia panel, we will repeat her CBC, review morphology, and rule out hemolysis with a Coombs, haptoglobin, and LDH.  She will obtain urinalysis and serum protein electrophoresis to rule out plasma cell dyscrasia.  She needs to undergo a bone marrow biopsy with aspiration. She will be referred to IR.  S The patient returns to discuss results.  I spent more than half the time coordinating care.    ______________________________ Laurice Record, M.D. LIO/MEDQ  D:  11/06/2011  T:  11/06/2011  Job:  [id number]

## 2011-11-06 NOTE — Progress Notes (Signed)
Dr.     Lynnea Ferrier     -     Primary  @  [facility]. Dr.    Chip Boer     -      GI  [facility]    On  [address]  NO  CELL   PHONE.

## 2011-11-06 NOTE — Progress Notes (Signed)
This office note has been dictated.

## 2011-11-09 ENCOUNTER — Other Ambulatory Visit: Payer: Self-pay | Admitting: Radiology

## 2011-11-10 ENCOUNTER — Telehealth: Payer: Self-pay | Admitting: Hematology and Oncology

## 2011-11-10 ENCOUNTER — Other Ambulatory Visit: Payer: Self-pay | Admitting: Hematology and Oncology

## 2011-11-10 DIAGNOSIS — D649 Anemia, unspecified: Secondary | ICD-10-CM

## 2011-11-10 NOTE — Telephone Encounter (Signed)
S/w pt today re appt for 2/13

## 2011-11-12 ENCOUNTER — Ambulatory Visit (HOSPITAL_COMMUNITY)
Admission: RE | Admit: 2011-11-12 | Discharge: 2011-11-12 | Disposition: A | Discharge status: Home / Self Care | Payer: Medicare Other | Source: Ambulatory Visit | Attending: Hematology and Oncology | Admitting: Hematology and Oncology

## 2011-11-12 ENCOUNTER — Encounter (HOSPITAL_COMMUNITY): Payer: Self-pay

## 2011-11-12 DIAGNOSIS — D649 Anemia, unspecified: Secondary | ICD-10-CM | POA: Insufficient documentation

## 2011-11-12 DIAGNOSIS — D539 Nutritional anemia, unspecified: Secondary | ICD-10-CM

## 2011-11-12 DIAGNOSIS — Z79899 Other long term (current) drug therapy: Secondary | ICD-10-CM | POA: Insufficient documentation

## 2011-11-12 DIAGNOSIS — I10 Essential (primary) hypertension: Secondary | ICD-10-CM | POA: Insufficient documentation

## 2011-11-12 HISTORY — DX: Unspecified osteoarthritis, unspecified site: M19.90

## 2011-11-12 LAB — PROTIME-INR: Prothrombin Time: 13.2 seconds (ref 11.6–15.2)

## 2011-11-12 LAB — CBC
MCH: 22.3 pg — ABNORMAL LOW (ref 26.0–34.0)
MCHC: 30.5 g/dL (ref 30.0–36.0)
Platelets: 350 10*3/uL (ref 150–400)
RDW: 20.4 % — ABNORMAL HIGH (ref 11.5–15.5)

## 2011-11-12 MED ORDER — MIDAZOLAM HCL 5 MG/5ML IJ SOLN
INTRAMUSCULAR | Status: AC | PRN
  Administered 2011-11-12 (×2): 1 mg via INTRAVENOUS

## 2011-11-12 MED ORDER — SODIUM CHLORIDE 0.9 % IV SOLN
Freq: Once | INTRAVENOUS | Status: AC
  Administered 2011-11-12: 08:00:00 via INTRAVENOUS

## 2011-11-12 MED ORDER — FENTANYL CITRATE 0.05 MG/ML IJ SOLN
INTRAMUSCULAR | Status: AC | PRN
  Administered 2011-11-12: 100 ug via INTRAVENOUS
  Administered 2011-11-12: 50 ug via INTRAVENOUS

## 2011-11-12 NOTE — H&P (Signed)
Melanie [name] is an 77 y.o. female.   Chief Complaint: "I'm here for a bone marrow biopsy" HPI: Patient with history of anemia presents today for CT guided bone marrow biopsy.  Past Medical History  Diagnosis Date  . Hypertension   . Allergy   . Hiatal hernia   . Anemia   . Cataract   . Diverticula of colon   . Arthritis     right hip is most painful and affects mobility as far as ambulating steps    Past Surgical History  Procedure Date  . Cholecystectomy   . Tonsillectomy   . Cataract extraction     bilat 2008    No family history on file. Social History:  reports that she has never smoked. She does not have any smokeless tobacco history on file. She reports that she does not drink alcohol or use illicit drugs.  Allergies:  Allergies  Allergen Reactions  . Codeine Nausea And Vomiting  . Keflex Other (See Comments)    Affected pt's child -  Therefore , pt does not take Keflex.  . Levaquin Other (See Comments)    Hallucinations.  . Morphine And Related Nausea And Vomiting  . Other     Another antibiotic that starts with cina?  . Quinolones Other (See Comments)    Hallucinations.  . Sulfa Antibiotics Nausea And Vomiting  . Allegra Other (See Comments)    unknown reaction  . Amoxicillin Nausea Only  . Floxin (Ocuflox) Other (See Comments)    unknown allergy  . Neurontin (Gabapentin) Palpitations  . Septra (Bactrim) Other (See Comments)    Unknown reaction    Medications Prior to Admission  Medication Sig Dispense Refill  . aspirin 81 MG tablet Take 81 mg by mouth daily.       . Calcium Carbonate-Vitamin D (CALCIUM 600/VITAMIN D) 600-400 MG-UNIT per chew tablet Chew 1 tablet by mouth 2 (two) times daily.      Marland Kitchen docusate sodium (COLACE) 100 MG capsule Take 200 mg by mouth daily before breakfast.       . metoprolol (LOPRESSOR) 50 MG tablet Take 50 mg by mouth 2 (two) times daily.        . Multiple Vitamin (MULITIVITAMIN WITH MINERALS) TABS Take 1 tablet by mouth  daily at 12 noon. Spectrative Chewable      . nystatin (MYCOSTATIN) 100000 UNIT/ML suspension Take 500,000 Units by mouth 4 (four) times daily.       . polysaccharide iron (NIFEREX) 150 MG CAPS capsule Take 150 mg by mouth 2 (two) times daily.        . fluticasone (CUTIVATE) 0.05 % cream Apply 1 application topically 2 (two) times daily as needed. To affected area      . tobramycin (TOBREX) 0.3 % ophthalmic solution Place 1 drop into both eyes every 4 (four) hours. For irritated eyes       Medications Prior to Admission  Medication Dose Route Frequency Provider Last Rate Last Dose  . 0.9 %  sodium chloride infusion   Intravenous Once Robet Leu, PA 20 mL/hr at 11/12/11 0746      Results for orders placed during the hospital encounter of 11/12/11 (from the past 48 hour(s))  APTT     Status: Normal   Collection Time   11/12/11  7:45 AM      Component Value Range Comment   aPTT 32  24 - 37 (seconds)   CBC     Status: Abnormal  Collection Time   11/12/11  7:45 AM      Component Value Range Comment   WBC 5.3  4.0 - 10.5 (K/uL)    RBC 4.52  3.87 - 5.11 (MIL/uL)    Hemoglobin 10.1 (*) 12.0 - 15.0 (g/dL)    HCT 16.1 (*) 09.6 - 46.0 (%)    MCV 73.2 (*) 78.0 - 100.0 (fL)    MCH 22.3 (*) 26.0 - 34.0 (pg)    MCHC 30.5  30.0 - 36.0 (g/dL)    RDW 04.5 (*) 40.9 - 15.5 (%)    Platelets 350  150 - 400 (K/uL)   PROTIME-INR     Status: Normal   Collection Time   11/12/11  7:45 AM      Component Value Range Comment   Prothrombin Time 13.2  11.6 - 15.2 (seconds)    INR 0.98  0.00 - 1.49     No results found.  Review of Systems  Constitutional: Negative for fever.  Respiratory: Negative for cough and shortness of breath.   Cardiovascular: Negative for chest pain.  Gastrointestinal: Negative for nausea, vomiting and abdominal pain.  Neurological: Negative for headaches.  Endo/Heme/Allergies: Does not bruise/bleed easily.    Blood pressure 156/80, pulse 80, temperature 98.8 F (37.1 C),  temperature source Oral, resp. rate 16, height 5\' 4"  (1.626 m), weight 196 lb (88.905 kg), SpO2 98.00%. Physical Exam  Constitutional: She is oriented to person, place, and time. She appears well-developed and well-nourished.  Cardiovascular: Normal rate and regular rhythm.   Respiratory: Effort normal and breath sounds normal.  GI: Soft. Bowel sounds are normal. There is no tenderness.  Musculoskeletal: Normal range of motion.  Neurological: She is alert and oriented to person, place, and time.     Assessment/Plan Patient with history of anemia; plan is for CT guided bone marrow biopsy.  ALLRED,D KEVIN 11/12/2011, 9:02 AM

## 2011-11-12 NOTE — Procedures (Signed)
CT guided bone marrow biopsy.  No immediate complication.   

## 2011-11-25 ENCOUNTER — Telehealth: Payer: Self-pay | Admitting: Hematology and Oncology

## 2011-11-25 ENCOUNTER — Ambulatory Visit (HOSPITAL_BASED_OUTPATIENT_CLINIC_OR_DEPARTMENT_OTHER): Payer: Medicare Other | Admitting: Hematology and Oncology

## 2011-11-25 ENCOUNTER — Other Ambulatory Visit (HOSPITAL_BASED_OUTPATIENT_CLINIC_OR_DEPARTMENT_OTHER): Payer: Medicare Other | Admitting: Lab

## 2011-11-25 VITALS — BP 141/73 | HR 68 | Temp 97.8°F | Ht 64.0 in | Wt 196.5 lb

## 2011-11-25 DIAGNOSIS — D649 Anemia, unspecified: Secondary | ICD-10-CM

## 2011-11-25 DIAGNOSIS — D509 Iron deficiency anemia, unspecified: Secondary | ICD-10-CM

## 2011-11-25 DIAGNOSIS — D539 Nutritional anemia, unspecified: Secondary | ICD-10-CM

## 2011-11-25 LAB — CBC WITH DIFFERENTIAL/PLATELET
Basophils Absolute: 0 10*3/uL (ref 0.0–0.1)
Eosinophils Absolute: 0.2 10*3/uL (ref 0.0–0.5)
HGB: 11 g/dL — ABNORMAL LOW (ref 11.6–15.9)
LYMPH%: 26.1 % (ref 14.0–49.7)
MCV: 74.3 fL — ABNORMAL LOW (ref 79.5–101.0)
MONO%: 8.4 % (ref 0.0–14.0)
NEUT#: 3.5 10*3/uL (ref 1.5–6.5)
Platelets: 321 10*3/uL (ref 145–400)
RBC: 4.65 10*6/uL (ref 3.70–5.45)

## 2011-11-25 LAB — URINALYSIS, MICROSCOPIC - CHCC
Blood: NEGATIVE
Nitrite: NEGATIVE
Protein: NEGATIVE mg/dL
pH: 7 (ref 4.6–8.0)

## 2011-11-25 NOTE — Progress Notes (Signed)
CC:   Claude Manges, MD [name], MD  IDENTIFYING STATEMENT:  Patient is a 77 year old woman who presents to discuss results.  INTERVAL HISTORY:  In summary, the patient has had chronic anemia.  She has been on and off oral iron over last couple of years.  She had required several units of packed red blood cells for symptomatic anemia in November 2012.  She had undergone a GI workup that included endoscopy and colonoscopy that revealed a hiatal hernia, otherwise unremarkable. She declined a small bowel exam.  Results of her recent workup are as follows:  11/06/2011 white cell count 5.8, hemoglobin 10.9, hematocrit 34.5, platelets 374; differential unremarkable; review of peripheral smear revealed normal-appearing white blood cells, red blood cells, and platelets.  BUN and creatinine 15 and 0.92 respectively; B12 520; folate greater than 20; ferritin was 12, iron 23, TIBC 491, saturation 5%.  A bone marrow exam on 11/12/2011 revealed a hypercellular bone marrow with trilineage hematopoiesis.  Erythroid precursors revealed only a progressive maturation.  Storage iron was absent.  Ring sideroblasts were absent.    MEDICATIONS REVIEWED AND UPDATED:  Nu-Iron 1 tablet daily.  ALLERGIES:  Codeine, Keflex, Levaquin, morphine, sulfa.  PAST MEDICAL HISTORY:  Unchanged.  FAMILY HISTORY:  Unchanged.  SOCIAL HISTORY:  Unchanged.  REVIEW OF SYSTEMS:  Besides minimal fatigue, has no other complaints. Rest of review of systems negative.  PHYSICAL EXAM:  General:  The patient is alert and oriented x3.  Vitals: Pulse 68, blood pressure 141/73, temperature 97.8, respirations 18, weight 196 pounds.  HEENT:  Head is atraumatic, normocephalic.  Eyes: Sclerae anicteric.  Mouth:  Moist.  Chest:  Clear.  CVS:  Heart sounds present.  Abdomen:  Obese, soft.  No masses.  Bowel sounds present. Extremities:  No calf tenderness.  LAB DATA:  As above.  In addition, sodium 140, potassium  4.3, chloride 105, CO2 25, glucose 90.  T bili 0.3, alkaline phosphatase 88, AST 23, ALT 18, calcium 10.  IMPRESSION AND PLAN:  [name] is a 77 year old woman with severe iron deficiency anemia.  Gastrointestinal workup minus capsule endoscopy was unremarkable.  She is still considering a GI consultation at a tertiary hospital.  With this said, she will benefit from intravenous iron in the form of Feraheme, which she has agreed to.  We discussed toxicity which is primarily infusional related. This will be scheduled on December 02, 2011.  She follows up in 4 months' time with blood work.  I spent more than half the time coordinating care.    ______________________________ Laurice Record, M.D. LIO/MEDQ  D:  11/25/2011  T:  11/25/2011  Job:  [id number]

## 2011-11-25 NOTE — Telephone Encounter (Signed)
appts made and printed for 2/20 and 6/7 and 6/11      aom

## 2011-11-25 NOTE — Progress Notes (Signed)
Pt here for f/u visit today.   Informed pt re:   Per md,   Stop  Nu Iron med today.    Restart  Nu Iron   1 month before next f/u appt in  June.    Pt and cousin voiced understanding.

## 2011-11-25 NOTE — Progress Notes (Signed)
This office note has been dictated.

## 2011-11-27 LAB — SPEP & IFE WITH QIG
Alpha-1-Globulin: 5.2 % — ABNORMAL HIGH (ref 2.9–4.9)
Alpha-2-Globulin: 11.3 % (ref 7.1–11.8)
Total Protein, Serum Electrophoresis: 6.7 g/dL (ref 6.0–8.3)

## 2011-11-27 LAB — DIRECT ANTIGLOBULIN TEST (NOT AT ARMC): DAT IgG: NEGATIVE

## 2011-12-02 ENCOUNTER — Ambulatory Visit (HOSPITAL_BASED_OUTPATIENT_CLINIC_OR_DEPARTMENT_OTHER): Payer: Medicare Other

## 2011-12-02 VITALS — BP 135/70 | HR 76 | Temp 97.6°F

## 2011-12-02 DIAGNOSIS — D649 Anemia, unspecified: Secondary | ICD-10-CM

## 2011-12-02 MED ORDER — SODIUM CHLORIDE 0.9 % IV SOLN
Freq: Once | INTRAVENOUS | Status: AC
  Administered 2011-12-02: 08:00:00 via INTRAVENOUS

## 2011-12-02 MED ORDER — SODIUM CHLORIDE 0.9 % IV SOLN
1020.0000 mg | Freq: Once | INTRAVENOUS | Status: AC
  Administered 2011-12-02: 1020 mg via INTRAVENOUS
  Filled 2011-12-02: qty 34

## 2011-12-02 NOTE — Patient Instructions (Signed)
Iron Deficiency Anemia  Today you received IV Fereheme.  There are many types of anemia. Iron deficiency anemia is the most common. Iron deficiency anemia is a decrease in the number of red blood cells caused by too little iron. Without enough iron, your body does not produce enough hemoglobin. Hemoglobin is a substance in red blood cells that carries oxygen to the body's tissues. Iron deficiency anemia may leave you tired and short of breath. CAUSES   Lack of iron in the diet.   This may be seen in infants and children, because there is little iron in milk.   This may be seen in adults who do not eat enough iron-rich foods.   This may be seen in pregnant or breastfeeding women who do not take iron supplements. There is a much higher need for iron intake at these times.   Poor absorption of iron, as seen with intestinal disorders.   Intestinal bleeding.   Heavy periods.  SYMPTOMS  Mild anemia may not be noticeable. Symptoms may include:  Fatigue.   Headache.   Pale skin.   Weakness.   Shortness of breath.   Dizziness.   Cold hands and feet.   Fast or irregular heartbeat.  DIAGNOSIS  Diagnosis requires a thorough evaluation and physical exam by your caregiver.  Blood tests are generally used to confirm iron deficiency anemia.   Additional tests may be done to find the underlying cause of your anemia. These may include:   Testing for blood in the stool (fecal occult blood test).   A procedure to see inside the colon and rectum (colonoscopy).   A procedure to see inside the esophagus and stomach (endoscopy).  TREATMENT   Correcting the cause of the iron deficiency is the first step.   Medicines, such as oral contraceptives, can make heavy menstrual flows lighter.   Antibiotics and other medicines can be used to treat peptic ulcers.   Surgery may be needed to remove a bleeding polyp, tumor, or fibroid.   Often, iron supplements (ferrous sulfate) are taken.     For the best iron absorption, take these supplements with an empty stomach.   You may need to take the supplements with food if you cannot tolerate them on an empty stomach. Vitamin C improves the absorption of iron. Your caregiver may recommend taking your iron tablets with a glass of orange juice or vitamin C supplement.   Milk and antacids should not be taken at the same time as iron supplements. They may interfere with the absorption of iron.   Iron supplements can cause constipation. A stool softener is often recommended.   Pregnant and breastfeeding women will need to take extra iron, because their normal diet usually will not provide the required amount.   Patients who cannot tolerate iron by mouth can take it through a vein (intravenously) or by an injection into the muscle.  HOME CARE INSTRUCTIONS   Ask your dietitian for help with diet questions.   Take iron and vitamins as directed by your caregiver.   Eat a diet rich in iron. Eat liver, lean beef, whole-grain bread, eggs, dried fruit, and dark green leafy vegetables.  SEEK IMMEDIATE MEDICAL CARE IF:   You have a fainting episode. Do not drive yourself. Call your local emergency services (911 in [place] if no other help is available.   You have chest pain, nausea, or vomiting.   You develop severe or increased shortness of breath with activities.   You develop  weakness or increased thirst.   You have a rapid heartbeat.   You develop unexplained sweating or become lightheaded when getting up from a chair or bed.  MAKE SURE YOU:   Understand these instructions.   Will watch your condition.   Will get help right away if you are not doing well or get worse.  Document Released: 09/25/2000 Document Revised: 06/10/2011 Document Reviewed: 02/04/2010 [facility] Patient Information 2012 [place], [place].

## 2011-12-08 ENCOUNTER — Encounter: Payer: Self-pay | Admitting: Hematology and Oncology

## 2011-12-16 ENCOUNTER — Telehealth: Payer: Self-pay | Admitting: *Deleted

## 2011-12-16 NOTE — Telephone Encounter (Signed)
Received written note from pt's dentist  Dr.  Royston Sinner re:  Pt need clearance for upcoming dental surgery.   Spoke with Alona Bene @ Dr. Hyacinth Meeker and informed her re:  Per Dr. Dalene Carrow,  It is best to call primary as pt is seen in our office only for anemia and not chemo.   Spoke with pt at home and informed her of same info.

## 2012-02-16 ENCOUNTER — Telehealth: Payer: Self-pay | Admitting: *Deleted

## 2012-02-16 NOTE — Telephone Encounter (Signed)
Pt called and informed nurse re:  Pt just had labs drawn at her primary Dr. Gailen Shelter today.   Pt had asked his office to fax lab results to Dr. Dalene Carrow for review.    Pt stated at her last office visit in Feb, 2013 -  Pt was instructed by md to stop oral iron a week prior to pt receiving Feraheme.   Pt was also told by md to restart  Ferrex  150 mg  BID  1 month prior  To f/u with Dr. Dalene Carrow in June.    Pt would like for md to review labs from today from Dr. Gailen Shelter and to give further instructions whether  pt needs to restart Ferrex med.

## 2012-02-17 ENCOUNTER — Telehealth: Payer: Self-pay | Admitting: *Deleted

## 2012-02-17 NOTE — Telephone Encounter (Signed)
Spoke with pt and informed pt re:  Per Dr. Dalene Carrow ,  Do Not restart  Ferrex  Until after lab and f/u with md in June.   Pt voiced understanding.

## 2012-02-19 ENCOUNTER — Other Ambulatory Visit: Payer: Self-pay | Admitting: Family Medicine

## 2012-02-19 DIAGNOSIS — Z1231 Encounter for screening mammogram for malignant neoplasm of breast: Secondary | ICD-10-CM

## 2012-02-19 DIAGNOSIS — Z780 Asymptomatic menopausal state: Secondary | ICD-10-CM

## 2012-03-17 ENCOUNTER — Other Ambulatory Visit: Payer: Self-pay | Admitting: *Deleted

## 2012-03-18 ENCOUNTER — Other Ambulatory Visit (HOSPITAL_BASED_OUTPATIENT_CLINIC_OR_DEPARTMENT_OTHER): Payer: Medicare Other | Admitting: Lab

## 2012-03-18 DIAGNOSIS — D649 Anemia, unspecified: Secondary | ICD-10-CM

## 2012-03-18 LAB — CBC WITH DIFFERENTIAL/PLATELET
Eosinophils Absolute: 0.2 10*3/uL (ref 0.0–0.5)
HCT: 37.4 % (ref 34.8–46.6)
LYMPH%: 22.1 % (ref 14.0–49.7)
MONO#: 0.2 10*3/uL (ref 0.1–0.9)
NEUT#: 2.9 10*3/uL (ref 1.5–6.5)
NEUT%: 68.9 % (ref 38.4–76.8)
Platelets: 306 10*3/uL (ref 145–400)
RBC: 4.29 10*6/uL (ref 3.70–5.45)
WBC: 4.2 10*3/uL (ref 3.9–10.3)
lymph#: 0.9 10*3/uL (ref 0.9–3.3)

## 2012-03-18 LAB — FERRITIN: Ferritin: 12 ng/mL (ref 10–291)

## 2012-03-18 LAB — BASIC METABOLIC PANEL
CO2: 26 mEq/L (ref 19–32)
Calcium: 10.3 mg/dL (ref 8.4–10.5)
Chloride: 105 mEq/L (ref 96–112)
Glucose, Bld: 172 mg/dL — ABNORMAL HIGH (ref 70–99)
Sodium: 139 mEq/L (ref 135–145)

## 2012-03-18 LAB — IRON AND TIBC: %SAT: 18 % — ABNORMAL LOW (ref 20–55)

## 2012-03-22 ENCOUNTER — Telehealth: Payer: Self-pay | Admitting: Hematology and Oncology

## 2012-03-22 ENCOUNTER — Ambulatory Visit (HOSPITAL_COMMUNITY)
Admission: RE | Admit: 2012-03-22 | Discharge: 2012-03-22 | Disposition: A | Discharge status: Home / Self Care | Payer: Medicare Other | Source: Ambulatory Visit | Attending: Family Medicine | Admitting: Family Medicine

## 2012-03-22 ENCOUNTER — Encounter: Payer: Self-pay | Admitting: Hematology and Oncology

## 2012-03-22 ENCOUNTER — Other Ambulatory Visit (HOSPITAL_COMMUNITY): Payer: Medicare Other

## 2012-03-22 ENCOUNTER — Other Ambulatory Visit: Payer: Self-pay | Admitting: Hematology and Oncology

## 2012-03-22 ENCOUNTER — Ambulatory Visit (HOSPITAL_BASED_OUTPATIENT_CLINIC_OR_DEPARTMENT_OTHER): Payer: Medicare Other | Admitting: Hematology and Oncology

## 2012-03-22 VITALS — BP 148/78 | HR 83 | Temp 97.6°F | Ht 64.0 in | Wt 195.4 lb

## 2012-03-22 DIAGNOSIS — D509 Iron deficiency anemia, unspecified: Secondary | ICD-10-CM

## 2012-03-22 DIAGNOSIS — Z1231 Encounter for screening mammogram for malignant neoplasm of breast: Secondary | ICD-10-CM | POA: Insufficient documentation

## 2012-03-22 DIAGNOSIS — Z780 Asymptomatic menopausal state: Secondary | ICD-10-CM

## 2012-03-22 DIAGNOSIS — D539 Nutritional anemia, unspecified: Secondary | ICD-10-CM

## 2012-03-22 NOTE — Patient Instructions (Signed)
Melanie [name]  [id number]  [facility] Discharge Instructions  RECOMMENDATIONS MADE BY THE CONSULTANT AND ANY TEST RESULTS WILL BE SENT TO YOUR REFERRING DOCTOR.   EXAM FINDINGS BY MD TODAY AND SIGNS AND SYMPTOMS TO REPORT TO CLINIC OR PRIMARY MD:   Your current list of medications are: Current Outpatient Prescriptions  Medication Sig Dispense Refill  . aspirin 81 MG tablet Take 81 mg by mouth daily.       . Calcium Carbonate-Vitamin D (CALCIUM 600/VITAMIN D) 600-400 MG-UNIT per chew tablet Chew 1 tablet by mouth 2 (two) times daily.      Marland Kitchen docusate sodium (COLACE) 100 MG capsule Take 200 mg by mouth daily before breakfast.       . fluticasone (CUTIVATE) 0.05 % cream Apply 1 application topically 2 (two) times daily as needed. To affected area      . metoprolol (LOPRESSOR) 50 MG tablet Take 50 mg by mouth 2 (two) times daily.        . Multiple Vitamin (MULITIVITAMIN WITH MINERALS) TABS Take 1 tablet by mouth daily at 12 noon. Spectrative Chewable      . tobramycin (TOBREX) 0.3 % ophthalmic solution Place 1 drop into both eyes every 4 (four) hours. For irritated eyes         INSTRUCTIONS GIVEN AND DISCUSSED:   SPECIAL INSTRUCTIONS/FOLLOW-UP:  See above.  I acknowledge that I have been informed and understand all the instructions given to me and received a copy. I do not have any more questions at this time, but understand that I may call the [facility] at [phone] during business hours should I have any further questions or need assistance in obtaining follow-up care.

## 2012-03-22 NOTE — Progress Notes (Signed)
CC:   Claude Manges, MD [name], MD  IDENTIFYING STATEMENT:  The patient is a 77 year old woman with anemia who presents for followup.  INTERVAL HISTORY:  The patient tolerated IV iron in the form of Feraheme in February 2012.  Has good energy levels.  Has had no evidence of bleeding.  Has not undergone a full GI workup.  She declined small bowel exam and does not want to see another GI specialist at this time.  Results are as follows:  03/18/2012 hemoglobin and hematocrit 12.3 and 37.4 respectively, iron 58 (23), TIBC 327 (431), saturation 18% (5%), ferritin 12 (10).  MEDICATIONS:  Nu-Iron 1 tablet daily.  ALLERGIES:  Codeine, Keflex, Levaquin, morphine, sulfa.  PHYSICAL EXAM:  Alert and oriented x3.  Vitals:  Pulse 83, blood pressure 148/78, temperature 97.6, respirations 20, weight 195.4 pounds. HEENT:  Head is atraumatic, normocephalic.  Sclerae anicteric.  Mouth moist.  Chest:  Clear.  Abdomen:  Soft, nontender.  Bowel sounds present.  Extremities:  No edema.  LAB DATA:  As above.  In addition, white cell count 4.2, platelets 206. Sodium 139, potassium 4.3, chloride 105, CO2 26, BUN 16, creatinine 0.84, glucose 172, calcium 10.3.  IMPRESSION AND PLAN:  [name] is a 77 year old woman with iron- deficiency anemia.  Status post GI workup minus capsule endoscopy. Continues to receive refuse GI evaluation.  She does need iron replacement. She is requesting iron dextran rather than feraheme. (she has previously tolerated feraheme).  She receives infusion on April 01, 2012.  She begins Nu- Iron by mouth once daily.  She follows up in 5 months' time.     ______________________________ Laurice Record, M.D. LIO/MEDQ  D:  03/22/2012  T:  03/22/2012  Job:  [id number]

## 2012-03-22 NOTE — Telephone Encounter (Signed)
appts made and printed for pt aom °

## 2012-03-22 NOTE — Progress Notes (Signed)
This office note has been dictated.

## 2012-03-23 ENCOUNTER — Other Ambulatory Visit (HOSPITAL_COMMUNITY): Payer: Medicare Other

## 2012-03-30 ENCOUNTER — Ambulatory Visit: Payer: Medicare Other

## 2012-04-01 ENCOUNTER — Ambulatory Visit (HOSPITAL_BASED_OUTPATIENT_CLINIC_OR_DEPARTMENT_OTHER): Payer: Medicare Other

## 2012-04-01 VITALS — BP 155/77 | HR 92 | Temp 98.8°F

## 2012-04-01 DIAGNOSIS — D649 Anemia, unspecified: Secondary | ICD-10-CM

## 2012-04-01 MED ORDER — ACETAMINOPHEN 325 MG PO TABS
650.0000 mg | ORAL_TABLET | Freq: Once | ORAL | Status: AC
  Administered 2012-04-01: 650 mg via ORAL

## 2012-04-01 MED ORDER — DIPHENHYDRAMINE HCL 25 MG PO TABS
50.0000 mg | ORAL_TABLET | Freq: Once | ORAL | Status: AC
  Administered 2012-04-01: 50 mg via ORAL
  Filled 2012-04-01: qty 2

## 2012-04-01 MED ORDER — SODIUM CHLORIDE 0.9 % IV SOLN
50.0000 mg | Freq: Once | INTRAVENOUS | Status: AC
  Administered 2012-04-01: 50 mg via INTRAVENOUS
  Filled 2012-04-01: qty 1

## 2012-04-01 MED ORDER — HEPARIN SOD (PORK) LOCK FLUSH 100 UNIT/ML IV SOLN
500.0000 [IU] | Freq: Once | INTRAVENOUS | Status: DC | PRN
  Filled 2012-04-01: qty 5

## 2012-04-01 MED ORDER — SODIUM CHLORIDE 0.9 % IV SOLN
1500.0000 mg | Freq: Once | INTRAVENOUS | Status: AC
  Administered 2012-04-01: 1500 mg via INTRAVENOUS
  Filled 2012-04-01: qty 30

## 2012-04-01 MED ORDER — SODIUM CHLORIDE 0.9 % IJ SOLN
3.0000 mL | Freq: Once | INTRAMUSCULAR | Status: DC | PRN
  Filled 2012-04-01: qty 10

## 2012-04-01 MED ORDER — SODIUM CHLORIDE 0.9 % IV SOLN
Freq: Once | INTRAVENOUS | Status: AC
  Administered 2012-04-01: 09:00:00 via INTRAVENOUS

## 2012-04-01 NOTE — Patient Instructions (Addendum)
[facility] Discharge Instructions for Patients  Today you received the following Iron Dextran (Infed)   If you develop nausea and vomiting that is not controlled by your nausea medication, call the clinic. If it is after clinic hours your family physician or the after hours number for the clinic or go to the Emergency Department.   BELOW ARE SYMPTOMS THAT SHOULD BE REPORTED IMMEDIATELY:  *FEVER GREATER THAN 100.5 F  *CHILLS WITH OR WITHOUT FEVER  NAUSEA AND VOMITING THAT IS NOT CONTROLLED WITH YOUR NAUSEA MEDICATION  *UNUSUAL SHORTNESS OF BREATH  *UNUSUAL BRUISING OR BLEEDING  TENDERNESS IN MOUTH AND THROAT WITH OR WITHOUT PRESENCE OF ULCERS  *URINARY PROBLEMS  *BOWEL PROBLEMS  UNUSUAL RASH Items with * indicate a potential emergency and should be followed up as soon as possible.  One of the nurses will contact you 24 hours after your treatment. Please let the nurse know about any problems that you may have experienced. Feel free to call the clinic you have any questions or concerns. The clinic phone number is [phone].   I have been informed and understand all the instructions given to me. I know to contact the clinic, my physician, or go to the Emergency Department if any problems should occur. I do not have any questions at this time, but understand that I may call the clinic during office hours   should I have any questions or need assistance in obtaining follow up care.    __________________________________________  _____________  __________ Signature of Patient or Authorized Representative            Date                   Time    __________________________________________ Nurse's Signature  Iron Dextran injection What is this medicine?   IRON DEXTRAN (AHY ern DEX tran) is an iron complex. Iron is used to make healthy red blood cells, which carry oxygen and nutrients through the body. This medicine is used to treat people who cannot take iron  by mouth and have low levels of iron in the blood. This medicine may be used for other purposes; ask your health care provider or pharmacist if you have questions. What should I tell my health care provider before I take this medicine? They need to know if you have any of these conditions: -anemia not caused by low iron levels -heart disease -high levels of iron in the blood -kidney disease -liver disease -an unusual or allergic reaction to iron, other medicines, foods, dyes, or preservatives -pregnant or trying to get pregnant -breast-feeding How should I use this medicine? This medicine is for injection into a vein or a muscle. It is given by a health care professional in a hospital or clinic setting. Talk to your pediatrician regarding the use of this medicine in children. While this drug may be prescribed for children as young as 16 months old for selected conditions, precautions do apply. Overdosage: If you think you have taken too much of this medicine contact a poison control center or emergency room at once. NOTE: This medicine is only for you. Do not share this medicine with others. What if I miss a dose? It is important not to miss your dose. Call your doctor or health care professional if you are unable to keep an appointment. What may interact with this medicine? Do not take this medicine with any of the following medications: -deferoxamine -dimercaprol -other iron products This medicine may  also interact with the following medications: -chloramphenicol -deferasirox This list may not describe all possible interactions. Give your health care provider a list of all the medicines, herbs, non-prescription drugs, or dietary supplements you use. Also tell them if you smoke, drink alcohol, or use illegal drugs. Some items may interact with your medicine. What should I watch for while using this medicine? Visit your doctor or health care professional regularly. Tell your doctor if  your symptoms do not start to get better or if they get worse. You may need blood work done while you are taking this medicine. You may need to follow a special diet. Talk to your doctor. Foods that contain iron include: whole grains/cereals, dried fruits, beans, or peas, leafy green vegetables, and organ meats (liver, kidney). Long-term use of this medicine may increase your risk of some cancers. Talk to your doctor about how to limit your risk. What side effects may I notice from receiving this medicine? Side effects that you should report to your doctor or health care professional as soon as possible: -allergic reactions like skin rash, itching or hives, swelling of the face, lips, or tongue -blue lips, nails, or skin -breathing problems -changes in blood pressure -chest pain -confusion -fast, irregular heartbeat -feeling faint or lightheaded, falls -fever or chills -flushing, sweating, or hot feelings -joint or muscle aches or pains -pain, tingling, numbness in the hands or feet -seizures -unusually weak or tired Side effects that usually do not require medical attention (report to your doctor or health care professional if they continue or are bothersome): -change in taste (metallic taste) -diarrhea -headache -irritation at site where injected -nausea, vomiting -stomach upset This list may not describe all possible side effects. Call your doctor for medical advice about side effects. You may report side effects to FDA at 1-800-FDA-1088. Where should I keep my medicine? This drug is given in a hospital or clinic and will not be stored at home. NOTE: This sheet is a summary. It may not cover all possible information. If you have questions about this medicine, talk to your doctor, pharmacist, or health care provider.  2012, Elsevier/[employer] Standard. (02/14/2008 4:59:50 PM)

## 2012-04-04 ENCOUNTER — Telehealth: Payer: Self-pay | Admitting: Nurse Practitioner

## 2012-04-04 ENCOUNTER — Telehealth: Payer: Self-pay | Admitting: Pharmacist

## 2012-04-04 NOTE — Telephone Encounter (Signed)
Message copied by Augusto Garbe on Mon Apr 04, 2012  3:20 PM ------      Message from: Lou­za, Georgia N      Created: Mon Apr 04, 2012 10:54 AM      Regarding: Follow up        Md wants a f/u call for first time Infed. 04/01/12.  Dr. Dalene Carrow. Pt tolerated infusion well.             Thanks

## 2012-04-04 NOTE — Telephone Encounter (Signed)
Called patient to follow up after Iron Dextran given on 04-01-2012.  [name] is doing well.  Denies any abdominal pain, cramps, diarrhea.  She is eating and drinking well.    *# Melanie [name] did express concern and questions about the treatment she received.  Asked that Provider, collaborative nurse or pharmacist call her.  She has read the after visit summary and discontinued her Nu-Iron.  This summary reads she is not to take other iron products and the medicine she received can cause some cancers.  *#  Will notify providers.  Patient can be reached at [phone].

## 2012-04-04 NOTE — Telephone Encounter (Signed)
Called patient per request of Rosalind, RN in triage.  Pt stated she was upset d/t receiving AVS with discharge instructions that stated iron dextran "increased risk of some cancers".  RN attempted to educate patient that instructions relay all possible side effects and may not apply to her.  Also informed her that the statement about iron dextran causing cancer is related to long term use of iron dextran as an intramuscular injection, which has been linked to some sarcomas.  However, pt did not receive IM iron dextran and definitely has not had long term use- therefore patient is not at risk for cancer related to iron infusion.    Patient spoke rapidly, often contradicting herself.  Patient also stated she had received iron pills "in a plastic bag" from her pharmacy and was concerned about toxicity.  She stated she had called poison control about this issue, and several other issues.  Patient was unreceptive to any information offered by this RN.  Patient stated "I want something in writing saying I'm not going to have cancer.  I want something in writing saying this drug is FDA approved".  RN assured patient that Dr. Dalene Carrow would not prescribe medication for patient that is not FDA approved and not standard of care.  RN also pointed out to patient that the FDA's phone number is listed on discharge instructions and she is welcome to call for further assurance.  Patient stated, "I don't believe you, I am going to call the FDA and poison control." Patient also insisted on a call from a pharmacist.  RN relayed information to Lenon Curt, PharmD who stated she will also call patient.

## 2012-04-05 ENCOUNTER — Encounter: Payer: Self-pay | Admitting: Pharmacist

## 2012-04-05 NOTE — Progress Notes (Signed)
I spoke at length (30 minutes) with Melanie [name] over the phone today about her iron therapy.  She is primarily upset about receiving Iron Dextran on 04/01/12 & felt uninformed that the drug has the potential to cause cancer.  This information was on a print-out that she got upon discharge 04/01/12.  If she had this information before that time, she may have not agreed to receive IV Iron Dextran. She misunderstood that she did receive IV Feraheme on 2/20 then Iron Dextran on 6/21.  She had her patient instruction sheets from both visits & I reviewed them with her over the phone & she understands based on the information sheet, she got 2 different iron products. She would like something in writing stating the risk of developing cancer from Iron Dextran is minimal to none.  I agreed to send her information but over the phone, I explained that the evidence relating to the development of cancer from Iron Dextran dates back to a report in the [facility] Journal in 1976.  The report showed that there were 4 cases of sarcoma related to IM Iron Dextran.  The pts developed sarcoma over a period of 12-15 years. I explained to the pt that 4 patients out of thousands of patients who have received Iron Dextran since 1976 means that the risk overall is less than 1%. After 1976 if there were more reports of cancer from Iron Dextran, then the drug would be withdrawn from the market by the FDA.  I explained that she did not get Iron Dextran by IM route, rather she was given IV route.   I assured pt that even if Dr. Dalene Carrow decided to give her Iron Dextran again, it would be safe for her to receive as this remains a safe standard of care at our institution for the treatment of iron deficiency anemia. Pt felt more at ease after our conversation, I believe. Marily Lente, Pharm.D.

## 2012-04-22 ENCOUNTER — Ambulatory Visit: Payer: Medicare Other | Admitting: *Deleted

## 2012-04-22 ENCOUNTER — Other Ambulatory Visit: Payer: Medicare Other | Admitting: Lab

## 2012-04-27 ENCOUNTER — Ambulatory Visit: Payer: Medicare Other | Admitting: Hematology and Oncology

## 2012-05-05 ENCOUNTER — Encounter: Payer: Medicare Other | Attending: Family Medicine | Admitting: *Deleted

## 2012-05-05 ENCOUNTER — Encounter: Payer: Self-pay | Admitting: *Deleted

## 2012-05-05 ENCOUNTER — Ambulatory Visit: Payer: Medicare Other | Admitting: *Deleted

## 2012-05-05 VITALS — Ht 62.0 in | Wt 192.5 lb

## 2012-05-05 DIAGNOSIS — Z713 Dietary counseling and surveillance: Secondary | ICD-10-CM | POA: Insufficient documentation

## 2012-05-05 DIAGNOSIS — D649 Anemia, unspecified: Secondary | ICD-10-CM | POA: Insufficient documentation

## 2012-05-05 NOTE — Patient Instructions (Signed)
Increase iron-rich foods Increase vitamin C consumption with meals

## 2012-05-05 NOTE — Progress Notes (Signed)
  Medical Nutrition Therapy:  Appt start time: 1400 end time:  1500.   Assessment:  Primary concerns today: iron deficiency.   MEDICATIONS: see list   DIETARY INTAKE:  Usual eating pattern includes 3 meals and 1 snacks per day.  Everyday foods include starches, fruits, some proteins.  Avoided foods include NNS, beans, leafy greens, nuts/seeds.    24-hr recall:  B ( AM): orange juice with prune juice, decaff coffee, english muffin with cream cheese; sometimes pancakes and bacon  Snk ( AM): none  L ( PM): sandwiches-cheese and tomato or tuna; sometimes soups Snk ( PM): none D ( PM): sometimes sandwiches; sometimes has meat and baked potato with cottage cheese Snk ( PM): sometimes has chips dipped in yogurt Beverages: water  Usual physical activity: ADL  Estimated energy needs: 1200 calories 135 g carbohydrates 90 g protein 33 g fat  Progress Towards Goal(s):  In progress.   Nutritional Diagnosis: NI-55.1 Inadequate iron intake related to avoidance of leafy greens, beans, and liver.  As evidenced by iron-deficiency anemia.    Intervention:  Nutrition counseling provided.  Patient and her cousin here today for education on increasing iron intake.  Was not able to provide much education because patient and her cousin talked the whole hour about their grievances against our healthcare system.  Leslye Peer a handout on ways to increase her iron intake, encouraged her to increase vitamin C consumption with meals to increase iron absorption.    Handouts given during visit include:  Iron nutrition therapy  Monitoring/Evaluation:  Dietary intake and iron status prn.

## 2012-08-19 ENCOUNTER — Other Ambulatory Visit (HOSPITAL_BASED_OUTPATIENT_CLINIC_OR_DEPARTMENT_OTHER): Payer: Medicare Other | Admitting: Lab

## 2012-08-19 DIAGNOSIS — D539 Nutritional anemia, unspecified: Secondary | ICD-10-CM

## 2012-08-19 LAB — CBC WITH DIFFERENTIAL/PLATELET
Basophils Absolute: 0 10*3/uL (ref 0.0–0.1)
Eosinophils Absolute: 0.2 10*3/uL (ref 0.0–0.5)
HGB: 12.4 g/dL (ref 11.6–15.9)
MCV: 89 fL (ref 79.5–101.0)
MONO#: 0.3 10*3/uL (ref 0.1–0.9)
NEUT#: 4.3 10*3/uL (ref 1.5–6.5)
RBC: 4.28 10*6/uL (ref 3.70–5.45)
RDW: 14 % (ref 11.2–14.5)
WBC: 6.2 10*3/uL (ref 3.9–10.3)
lymph#: 1.3 10*3/uL (ref 0.9–3.3)

## 2012-08-23 ENCOUNTER — Encounter: Payer: Self-pay | Admitting: Hematology and Oncology

## 2012-08-23 ENCOUNTER — Telehealth: Payer: Self-pay | Admitting: Hematology and Oncology

## 2012-08-23 ENCOUNTER — Ambulatory Visit (HOSPITAL_BASED_OUTPATIENT_CLINIC_OR_DEPARTMENT_OTHER): Payer: Medicare Other | Admitting: Hematology and Oncology

## 2012-08-23 VITALS — BP 157/86 | HR 69 | Temp 98.5°F | Resp 20 | Ht 62.0 in | Wt 198.3 lb

## 2012-08-23 DIAGNOSIS — D509 Iron deficiency anemia, unspecified: Secondary | ICD-10-CM

## 2012-08-23 DIAGNOSIS — D539 Nutritional anemia, unspecified: Secondary | ICD-10-CM

## 2012-08-23 LAB — IRON AND TIBC
%SAT: 16 % — ABNORMAL LOW (ref 20–55)
Iron: 57 ug/dL (ref 42–145)
TIBC: 352 ug/dL (ref 250–470)
UIBC: 295 ug/dL (ref 125–400)

## 2012-08-23 LAB — FERRITIN: Ferritin: 10 ng/mL (ref 10–291)

## 2012-08-23 LAB — FOLATE: Folate: >24 ng/mL (ref >5.4)

## 2012-08-23 NOTE — Telephone Encounter (Signed)
gv and printed pt appt schedule for NOV and DEC °

## 2012-08-23 NOTE — Progress Notes (Signed)
This office note has been dictated.

## 2012-08-23 NOTE — Patient Instructions (Addendum)
Gildardo Pounds [place]  [id number]   [facility] - AFTER VISIT SUMMARY   **RECOMMENDATIONS MADE BY THE CONSULTANT AND ANY TEST    RESULTS WILL BE SENT TO YOUR REFERRING DOCTORS.   YOUR EXAM FINDINGS, LABS AND RESULTS WERE DISCUSSED BY YOUR MD TODAY.  YOU CAN GO TO THE [place] WEB SITE FOR INSTRUCTIONS ON HOW TO ASSESS MY CHART FOR ADDITIONAL INFORMATION AS NEEDED.  Your Updated drug allergies are: Allergies as of 08/23/2012 - Review Complete 08/23/2012  Allergen Reaction Noted  . Cephalexin Other (See Comments) 08/22/2011  . Codeine Nausea And Vomiting 10/21/2011  . Levofloxacin Other (See Comments) 04/16/2011  . Morphine and related Nausea And Vomiting 10/21/2011  . Other  08/22/2011  . Quinolones Other (See Comments) 11/06/2011  . Sulfa antibiotics Nausea And Vomiting 11/06/2011  . Amoxicillin Nausea Only 11/12/2011  . Fexofenadine hcl Other (See Comments) 11/12/2011  . Floxin (ocuflox) Other (See Comments) 11/12/2011  . Neurontin (gabapentin) Palpitations 11/12/2011  . Septra (bactrim) Other (See Comments) 11/12/2011    Your current list of medications are: Current Outpatient Prescriptions  Medication Sig Dispense Refill  . aspirin 81 MG tablet Take 81 mg by mouth daily.       . Calcium Carbonate-Vitamin D (CALCIUM 600/VITAMIN D) 600-400 MG-UNIT per chew tablet Chew 1 tablet by mouth 2 (two) times daily.      Marland Kitchen docusate sodium (COLACE) 100 MG capsule Take 200 mg by mouth daily before breakfast.       . fluticasone (CUTIVATE) 0.05 % cream Apply 1 application topically 2 (two) times daily as needed. To affected area      . iron polysaccharides (NIFEREX) 150 MG capsule Take 150 mg by mouth 2 (two) times daily.      . metoprolol (LOPRESSOR) 50 MG tablet Take 50 mg by mouth 2 (two) times daily.        . Multiple Vitamin (MULITIVITAMIN WITH MINERALS) TABS Take 1 tablet by mouth daily at 12 noon. Spectrative Chewable      . tobramycin (TOBREX) 0.3 % ophthalmic solution  Place 1 drop into both eyes every 4 (four) hours. For irritated eyes         INSTRUCTIONS GIVEN AND DISCUSSED:  See attached schedule   SPECIAL INSTRUCTIONS/FOLLOW-UP:  See above.  I acknowledge that I have been informed and understand all the instructions given to me and received a copy.I know to contact the clinic, my physician, or go to the emergency Department if any problems should occur.   I do not have any more questions at this time, but understand that I may call the [facility] at [phone] during business hours should I have any further questions or need assistance in obtaining follow-up care.

## 2012-08-24 NOTE — Progress Notes (Signed)
CC:   Melanie Manges, MD Melanie [name] Melanie Howard, MD  IDENTIFYING STATEMENT:  The patient is a 77 year old woman with anemia who presents for followup.  INTERVAL HISTORY:  The patient received IV iron in form of INFeD per her request on 04/01/2012.  Notes lack in energy levels but no evidence of bleeding.  Again, does not want to undergo for GI workup.  RESULTS:  Are as follows; 08/19/2012, hemoglobin, hematocrit 12.4 and 38.1 respectively.  Platelets 302; iron 57, TIBC 352, saturation 16%, ferritin 10 (12).  MEDICATIONS:  Reviewed.  Has not begun oral iron.  ALLERGIES:  Updated.  PHYSICAL EXAMINATION:  Alert and oriented x3.  Vitals:  Pulse 69, blood pressure 157/86, temperature 98.5, respirations 20, weight 198.3 pounds. Sclerae anicteric.  Mouth moist.  Chest/CVS:  Unremarkable.  Abdomen: Soft.  Extremities:  No edema.  LABORATORY DATA:  As above.  In addition BMET pending.  IMPRESSION AND PLAN:  [name] is a 77 year old woman with iron- deficiency anemia.  Ferritin levels have gone down.  Note, a bone marrow biopsy earlier this year was unremarkable, except for low iron.  She requires IV iron and has now requested the Feraheme rather an iron dextran.  A reverse from the last time.  So this will be scheduled in the upcoming week.  She has also agreed to take oral iron the form of Nu- Iron.  She follows up in 4 months' time with blood work.    ______________________________ Laurice Record, M.D. LIO/MEDQ  D:  08/23/2012  T:  08/24/2012  Job:  [id number]

## 2012-08-26 ENCOUNTER — Ambulatory Visit (HOSPITAL_BASED_OUTPATIENT_CLINIC_OR_DEPARTMENT_OTHER): Payer: Medicare Other

## 2012-08-26 VITALS — BP 136/74 | HR 70 | Temp 97.9°F | Resp 17

## 2012-08-26 DIAGNOSIS — D649 Anemia, unspecified: Secondary | ICD-10-CM

## 2012-08-26 MED ORDER — SODIUM CHLORIDE 0.9 % IV SOLN
1020.0000 mg | Freq: Once | INTRAVENOUS | Status: AC
  Administered 2012-08-26: 1020 mg via INTRAVENOUS
  Filled 2012-08-26: qty 34

## 2012-08-26 MED ORDER — SODIUM CHLORIDE 0.9 % IV SOLN
Freq: Once | INTRAVENOUS | Status: AC
  Administered 2012-08-26: 14:00:00 via INTRAVENOUS

## 2012-08-26 NOTE — Patient Instructions (Signed)
  FERUMOXYTOL is an iron complex. Iron is used to make healthy red blood cells, which carry oxygen and nutrients throughout the body. This medicine is used to treat iron deficiency anemia in people with chronic kidney disease. This medicine may be used for other purposes; ask your health care provider or pharmacist if you have questions. What should I tell my health care provider before I take this medicine? They need to know if you have any of these conditions: -anemia not caused by low iron levels -high levels of iron in the blood -magnetic resonance imaging (MRI) test scheduled -an unusual or allergic reaction to iron, other medicines, foods, dyes, or preservatives -pregnant or trying to get pregnant -breast-feeding How should I use this medicine? This medicine is for infusion into a vein. It is given by a health care professional in a hospital or clinic setting. Talk to your pediatrician regarding the use of this medicine in children. Special care may be needed. Overdosage: If you think you've taken too much of this medicine contact a poison control center or emergency room at once. Overdosage: If you think you have taken too much of this medicine contact a poison control center or emergency room at once. NOTE: This medicine is only for you. Do not share this medicine with others. What if I miss a dose? It is important not to miss your dose. Call your doctor or health care professional if you are unable to keep an appointment. What may interact with this medicine? This medicine may interact with the following medications: -other iron products This list may not describe all possible interactions. Give your health care provider a list of all the medicines, herbs, non-prescription drugs, or dietary supplements you use. Also tell them if you smoke, drink alcohol, or use illegal drugs. Some items may interact with your medicine. What should I watch for while using this medicine? Visit your  doctor or healthcare professional regularly. Tell your doctor or healthcare professional if your symptoms do not start to get better or if they get worse. You may need blood work done while you are taking this medicine. You may need to follow a special diet. Talk to your doctor. Foods that contain iron include: whole grains/cereals, dried fruits, beans, or peas, leafy green vegetables, and organ meats (liver, kidney). What side effects may I notice from receiving this medicine? Side effects that you should report to your doctor or health care professional as soon as possible: -allergic reactions like skin rash, itching or hives, swelling of the face, lips, or tongue -breathing problems -changes in blood pressure -feeling faint or lightheaded, falls -fever or chills -flushing, sweating, or hot feelings -swelling of the ankles or feet Side effects that usually do not require medical attention (Report these to your doctor or health care professional if they continue or are bothersome.): -diarrhea -headache -nausea, vomiting -stomach pain This list may not describe all possible side effects. Call your doctor for medical advice about side effects. You may report side effects to FDA at 1-800-FDA-1088. Where should I keep my medicine? This drug is given in a hospital or clinic and will not be stored at home. NOTE: This sheet is a summary. It may not cover all possible information. If you have questions about this medicine, talk to your doctor, pharmacist, or health care provider.  2012, Elsevier/Gold Standard. (06/20/2008 9:48:25 PM) 

## 2012-08-26 NOTE — Progress Notes (Signed)
1540 Patient tolerated treatment well, no complaints. Post 30 minute VSS. Discharged ambulating with spouse. AVS provided.

## 2012-11-12 ENCOUNTER — Telehealth: Payer: Self-pay | Admitting: Oncology

## 2012-11-12 ENCOUNTER — Encounter: Payer: Self-pay | Admitting: Oncology

## 2012-12-23 ENCOUNTER — Other Ambulatory Visit (HOSPITAL_BASED_OUTPATIENT_CLINIC_OR_DEPARTMENT_OTHER): Payer: Medicare Other | Admitting: Lab

## 2012-12-23 LAB — CBC WITH DIFFERENTIAL/PLATELET
BASO%: 0.9 % (ref 0.0–2.0)
EOS%: 5.4 % (ref 0.0–7.0)
Eosinophils Absolute: 0.3 10*3/uL (ref 0.0–0.5)
HCT: 32.2 % — ABNORMAL LOW (ref 34.8–46.6)
LYMPH%: 23.1 % (ref 14.0–49.7)
MONO#: 0.3 10*3/uL (ref 0.1–0.9)
NEUT#: 3 10*3/uL (ref 1.5–6.5)
Platelets: 306 10*3/uL (ref 145–400)
RBC: 3.98 10*6/uL (ref 3.70–5.45)
WBC: 4.7 10*3/uL (ref 3.9–10.3)
lymph#: 1.1 10*3/uL (ref 0.9–3.3)

## 2012-12-23 LAB — BASIC METABOLIC PANEL (CC13)
BUN: 17.5 mg/dL (ref 7.0–26.0)
Potassium: 4.1 mEq/L (ref 3.5–5.1)
Sodium: 141 mEq/L (ref 136–145)

## 2012-12-23 LAB — IRON AND TIBC
TIBC: 393 ug/dL (ref 250–470)
UIBC: 362 ug/dL (ref 125–400)

## 2012-12-25 ENCOUNTER — Encounter: Payer: Self-pay | Admitting: Oncology

## 2012-12-25 ENCOUNTER — Other Ambulatory Visit: Payer: Self-pay | Admitting: Oncology

## 2012-12-25 DIAGNOSIS — D509 Iron deficiency anemia, unspecified: Secondary | ICD-10-CM | POA: Insufficient documentation

## 2012-12-25 HISTORY — DX: Iron deficiency anemia, unspecified: D50.9

## 2012-12-27 ENCOUNTER — Ambulatory Visit: Payer: Medicare Other | Admitting: Hematology and Oncology

## 2012-12-29 ENCOUNTER — Ambulatory Visit (HOSPITAL_BASED_OUTPATIENT_CLINIC_OR_DEPARTMENT_OTHER): Payer: Medicare Other | Admitting: Lab

## 2012-12-29 ENCOUNTER — Telehealth: Payer: Self-pay | Admitting: Oncology

## 2012-12-29 ENCOUNTER — Ambulatory Visit (HOSPITAL_BASED_OUTPATIENT_CLINIC_OR_DEPARTMENT_OTHER): Payer: Medicare Other | Admitting: Oncology

## 2012-12-29 VITALS — BP 124/78 | HR 79 | Temp 97.7°F | Resp 20 | Ht 62.0 in | Wt 198.1 lb

## 2012-12-29 DIAGNOSIS — D509 Iron deficiency anemia, unspecified: Secondary | ICD-10-CM

## 2012-12-29 NOTE — Telephone Encounter (Signed)
Gave pt appt for lab and ML for [facility] 2015, lab every 3 months °

## 2012-12-29 NOTE — Telephone Encounter (Signed)
Gave pt appt for lab and ML for [facility] 2015, lab every 3 months

## 2012-12-29 NOTE — Progress Notes (Signed)
[facility]  [phone] [phone]   OFFICE PROGRESS NOTE   Cc:  [name],[name], MD  DIAGNOSIS: iron deficiency anemia; unclear causes; negative GI work up by Dr. Rulon Eisenmenger.   CURRENT THERAPY:  IV iron.  She does not tolerate oral iron due to constipation.   INTERVAL HISTORY: Melanie [name] 78 y.o. female returns for regular follow up with a patient advocate. She reported chronic fatigue all her life.  She denied any visible source of bleeding.  She denied dizziness, ice pica, SOB, chest pain, skin rash, bone pain.  The rest of the 14 point review of sytem was negative.    Past Medical History  Diagnosis Date  . Hypertension   . Allergy   . Hiatal hernia   . Anemia   . Cataract   . Diverticula of colon   . Arthritis     right hip is most painful and affects mobility as far as ambulating steps  . Iron deficiency anemia, unspecified 12/25/2012    Past Surgical History  Procedure Laterality Date  . Cholecystectomy    . Tonsillectomy    . Cataract extraction      bilat 2008    Current Outpatient Prescriptions  Medication Sig Dispense Refill  . aspirin 81 MG tablet Take 81 mg by mouth daily.       . Calcium Carbonate-Vitamin D (CALCIUM 600/VITAMIN D) 600-400 MG-UNIT per chew tablet Chew 1 tablet by mouth 2 (two) times daily.      Marland Kitchen docusate sodium (COLACE) 100 MG capsule Take 200 mg by mouth daily before breakfast.       . metoprolol (LOPRESSOR) 50 MG tablet Take 50 mg by mouth 2 (two) times daily.        . Multiple Vitamin (MULITIVITAMIN WITH MINERALS) TABS Take 1 tablet by mouth daily at 12 noon. Spectrative Chewable       No current facility-administered medications for this visit.    ALLERGIES:  is allergic to cephalexin; codeine; levofloxacin; morphine and related; other; quinolones; sulfa antibiotics; amoxicillin; fexofenadine hcl; floxin; neurontin; and septra.  REVIEW OF SYSTEMS:  The rest of the 14-point review of system  was negative.   Filed Vitals:   12/29/12 1326  BP: 124/78  Pulse: 79  Temp: 97.7 F (36.5 C)  Resp: 20   Wt Readings from Last 3 Encounters:  12/29/12 198 lb 1.6 oz (89.858 kg)  08/23/12 198 lb 4.8 oz (89.948 kg)  05/05/12 192 lb 8 oz (87.317 kg)   ECOG Performance status: 1  PHYSICAL EXAMINATION:   General:  well-nourished woman, in no acute distress.  Eyes:  no scleral icterus.  ENT:  There were no oropharyngeal lesions.  Neck was without thyromegaly.  Lymphatics:  Negative cervical, supraclavicular or axillary adenopathy.  Respiratory: lungs were clear bilaterally without wheezing or crackles.  Cardiovascular:  Regular rate and rhythm, S1/S2, without murmur, rub or gallop.  There was no pedal edema.  GI:  abdomen was soft, flat, nontender, nondistended, without organomegaly.  Muscoloskeletal:  no spinal tenderness of palpation of vertebral spine.  Skin exam was without echymosis, petichae.  Neuro exam was nonfocal.  Patient was able to get on and off exam table without assistance.  Gait was normal.  Patient was alert and oriented.  Attention was good.   Language was appropriate.  Mood was normal without depression.  Speech was not pressured.  Thought content was not tangential.         LABORATORY/RADIOLOGY DATA:  Lab Results  Component Value Date   WBC 4.7 12/23/2012   HGB 10.4* 12/23/2012   HCT 32.2* 12/23/2012   PLT 306 12/23/2012   GLUCOSE 106* 12/23/2012   ALKPHOS 88 11/06/2011   ALT 18 11/06/2011   AST 23 11/06/2011   NA 141 12/23/2012   K 4.1 12/23/2012   CL 109* 12/23/2012   CREATININE 0.8 12/23/2012   BUN 17.5 12/23/2012   CO2 25 12/23/2012   INR 0.98 11/12/2011     ASSESSMENT AND PLAN:   1.  Iron deficiency anemia:   - Neg GI work up. - She again has iron deficiency.  She also has anemia.  I recommended IV Feraheme within the next few days.  She could not tolerate different formulations of oral iron due to constipation.  She did not like Iron Dextran for unclear  reason.  I recommended Feraheme due to ease of administration and slightly lower risk of infusion reaction. However, there is still a slight risk of myalgia/arthralgia.  She would like to proceed with Feraheme.   2.  Follow up:  q3 month CBC/ferritin level.  Return visit in about 1 year.      The length of time of the face-to-face encounter was 15 minutes. More than 50% of time was spent counseling and coordination of care.

## 2012-12-29 NOTE — Telephone Encounter (Signed)
lvm for pt regarding to tx being added for tomorrow

## 2012-12-30 ENCOUNTER — Ambulatory Visit (HOSPITAL_BASED_OUTPATIENT_CLINIC_OR_DEPARTMENT_OTHER): Payer: Medicare Other

## 2012-12-30 VITALS — BP 148/74 | HR 88 | Temp 98.2°F

## 2012-12-30 DIAGNOSIS — D509 Iron deficiency anemia, unspecified: Secondary | ICD-10-CM

## 2012-12-30 MED ORDER — SODIUM CHLORIDE 0.9 % IV SOLN
1020.0000 mg | Freq: Once | INTRAVENOUS | Status: AC
  Administered 2012-12-30: 1020 mg via INTRAVENOUS
  Filled 2012-12-30: qty 34

## 2013-01-19 ENCOUNTER — Telehealth: Payer: Self-pay | Admitting: Oncology

## 2013-01-20 ENCOUNTER — Telehealth: Payer: Self-pay | Admitting: Family Medicine

## 2013-01-20 MED ORDER — METOPROLOL TARTRATE 50 MG PO TABS: 50.0000 mg | ORAL_TABLET | Freq: Two times a day (BID) | ORAL | Status: DC

## 2013-01-20 NOTE — Telephone Encounter (Signed)
rx refilled.

## 2013-02-27 ENCOUNTER — Telehealth: Payer: Self-pay | Admitting: Oncology

## 2013-03-13 ENCOUNTER — Other Ambulatory Visit (HOSPITAL_BASED_OUTPATIENT_CLINIC_OR_DEPARTMENT_OTHER): Payer: Medicare Other | Admitting: Lab

## 2013-03-13 ENCOUNTER — Telehealth: Payer: Self-pay | Admitting: *Deleted

## 2013-03-13 DIAGNOSIS — D509 Iron deficiency anemia, unspecified: Secondary | ICD-10-CM

## 2013-03-13 LAB — CBC WITH DIFFERENTIAL/PLATELET
Basophils Absolute: 0.1 10*3/uL (ref 0.0–0.1)
Eosinophils Absolute: 0.3 10*3/uL (ref 0.0–0.5)
HGB: 10.7 g/dL — ABNORMAL LOW (ref 11.6–15.9)
LYMPH%: 23.1 % (ref 14.0–49.7)
MONO#: 0.3 10*3/uL (ref 0.1–0.9)
NEUT#: 3.5 10*3/uL (ref 1.5–6.5)
Platelets: 289 10*3/uL (ref 145–400)
RBC: 3.88 10*6/uL (ref 3.70–5.45)
RDW: 18.1 % — ABNORMAL HIGH (ref 11.2–14.5)
WBC: 5.4 10*3/uL (ref 3.9–10.3)

## 2013-03-13 LAB — FERRITIN: Ferritin: 4 ng/mL — ABNORMAL LOW (ref 10–291)

## 2013-03-13 NOTE — Telephone Encounter (Signed)
Pt was in for lab work today,  Asking if she will need any more Lurlean Nanny today?  States wants to make sure it is ok for her to travel out of town.  Informed pt nurse will call her w/ ferritin results when available.  She verbalized understanding.

## 2013-03-15 ENCOUNTER — Telehealth: Payer: Self-pay | Admitting: *Deleted

## 2013-03-15 ENCOUNTER — Encounter: Payer: Self-pay | Admitting: Oncology

## 2013-03-15 ENCOUNTER — Other Ambulatory Visit: Payer: Self-pay | Admitting: Oncology

## 2013-03-15 ENCOUNTER — Other Ambulatory Visit: Payer: Self-pay | Admitting: *Deleted

## 2013-03-15 ENCOUNTER — Telehealth: Payer: Self-pay | Admitting: Oncology

## 2013-03-15 ENCOUNTER — Ambulatory Visit (HOSPITAL_BASED_OUTPATIENT_CLINIC_OR_DEPARTMENT_OTHER): Payer: Medicare Other

## 2013-03-15 VITALS — BP 133/80 | HR 78 | Temp 99.2°F | Resp 18

## 2013-03-15 DIAGNOSIS — D649 Anemia, unspecified: Secondary | ICD-10-CM

## 2013-03-15 DIAGNOSIS — D509 Iron deficiency anemia, unspecified: Secondary | ICD-10-CM

## 2013-03-15 MED ORDER — DIPHENHYDRAMINE HCL 25 MG PO TABS
25.0000 mg | ORAL_TABLET | Freq: Once | ORAL | Status: AC
  Administered 2013-03-15: 25 mg via ORAL
  Filled 2013-03-15: qty 1

## 2013-03-15 MED ORDER — FERUMOXYTOL INJECTION 510 MG/17 ML
1020.0000 mg | Freq: Once | INTRAVENOUS | Status: AC
  Administered 2013-03-15: 1020 mg via INTRAVENOUS
  Filled 2013-03-15: qty 34

## 2013-03-15 MED ORDER — ACETAMINOPHEN 325 MG PO TABS
650.0000 mg | ORAL_TABLET | Freq: Once | ORAL | Status: AC
  Administered 2013-03-15: 650 mg via ORAL

## 2013-03-15 MED ORDER — SODIUM CHLORIDE 0.9 % IV SOLN
Freq: Once | INTRAVENOUS | Status: AC
  Administered 2013-03-15: 17:00:00 via INTRAVENOUS

## 2013-03-15 NOTE — Telephone Encounter (Signed)
Charge RN states can add pt on today at 4 pm for [place]. Called pt and instructed on appt today at 4 pm.  She verbalized understanding.

## 2013-03-15 NOTE — Patient Instructions (Addendum)
Ferumoxytol injection What is this medicine? FERUMOXYTOL is an iron complex. Iron is used to make healthy red blood cells, which carry oxygen and nutrients throughout the body. This medicine is used to treat iron deficiency anemia in people with chronic kidney disease. This medicine may be used for other purposes; ask your health care provider or pharmacist if you have questions. What should I tell my health care provider before I take this medicine? They need to know if you have any of these conditions: -anemia not caused by low iron levels -high levels of iron in the blood -magnetic resonance imaging (MRI) test scheduled -an unusual or allergic reaction to iron, other medicines, foods, dyes, or preservatives -pregnant or trying to get pregnant -breast-feeding How should I use this medicine? This medicine is for infusion into a vein. It is given by a health care professional in a hospital or clinic setting. Talk to your pediatrician regarding the use of this medicine in children. Special care may be needed. Overdosage: If you think you've taken too much of this medicine contact a poison control center or emergency room at once. Overdosage: If you think you have taken too much of this medicine contact a poison control center or emergency room at once. NOTE: This medicine is only for you. Do not share this medicine with others. What if I miss a dose? It is important not to miss your dose. Call your doctor or health care professional if you are unable to keep an appointment. What may interact with this medicine? This medicine may interact with the following medications: -other iron products This list may not describe all possible interactions. Give your health care provider a list of all the medicines, herbs, non-prescription drugs, or dietary supplements you use. Also tell them if you smoke, drink alcohol, or use illegal drugs. Some items may interact with your medicine. What should I watch  for while using this medicine? Visit your doctor or healthcare professional regularly. Tell your doctor or healthcare professional if your symptoms do not start to get better or if they get worse. You may need blood work done while you are taking this medicine. You may need to follow a special diet. Talk to your doctor. Foods that contain iron include: whole grains/cereals, dried fruits, beans, or peas, leafy green vegetables, and organ meats (liver, kidney). What side effects may I notice from receiving this medicine? Side effects that you should report to your doctor or health care professional as soon as possible: -allergic reactions like skin rash, itching or hives, swelling of the face, lips, or tongue -breathing problems -changes in blood pressure -feeling faint or lightheaded, falls -fever or chills -flushing, sweating, or hot feelings -swelling of the ankles or feet Side effects that usually do not require medical attention (Report these to your doctor or health care professional if they continue or are bothersome.): -diarrhea -headache -nausea, vomiting -stomach pain This list may not describe all possible side effects. Call your doctor for medical advice about side effects. You may report side effects to FDA at 1-800-FDA-1088. Where should I keep my medicine? This drug is given in a hospital or clinic and will not be stored at home. NOTE: This sheet is a summary. It may not cover all possible information. If you have questions about this medicine, talk to your doctor, pharmacist, or health care provider.  2013, Elsevier/Gold Standard. (06/20/2008 9:48:25 PM)  

## 2013-03-15 NOTE — Telephone Encounter (Signed)
Pt would like to know results of Ferritin and if Dr. Gaylyn Rong wants her to have Stark Bray?  She wants to go out of town w/i the next few days.

## 2013-03-15 NOTE — Telephone Encounter (Signed)
sw.. pt and confirmed todays appt

## 2013-03-15 NOTE — Telephone Encounter (Signed)
Yes.  I can order Ferahem.  I'll place a POF.  A scheduler will call with appointment.  There is no emergency in getting Feraheme.

## 2013-03-15 NOTE — Telephone Encounter (Signed)
Called pt w/ Ferritin results and Dr. Lodema Pilot order for [facility] w/i one week.  Pt asks if any way she can get Stark Bray this afternoon as she is planning to go out of town tomorrow or Friday.

## 2013-03-27 ENCOUNTER — Encounter: Payer: Self-pay | Admitting: Family Medicine

## 2013-03-27 ENCOUNTER — Ambulatory Visit (INDEPENDENT_AMBULATORY_CARE_PROVIDER_SITE_OTHER): Payer: Medicare Other | Admitting: Family Medicine

## 2013-03-27 VITALS — BP 130/80 | HR 92 | Temp 98.4°F | Resp 18 | Wt 196.0 lb

## 2013-03-27 DIAGNOSIS — J209 Acute bronchitis, unspecified: Secondary | ICD-10-CM

## 2013-03-27 NOTE — Progress Notes (Signed)
Subjective:    Patient ID: Melanie [name], female    DOB: [birth year], 78 y.o.   MRN: [id number]  HPI  Was recently visiting in [place], developed a cough productive of green sputum and severe wheezing. Also develop shortness of breath. Symptoms became so bad she had to go to the hospital. She was diagnosed with bronchitis. She was placed on doxycycline 100 mg by mouth twice a day and a prednisone taper pack.  She is doing much better. She states she is 7075% better. The cough is now productive of clear sputum. She is wheezing minimally. The shortness of breath is better. She is afebrile. Past Medical History  Diagnosis Date  . Hypertension   . Allergy   . Hiatal hernia   . Anemia   . Cataract   . Diverticula of colon   . Arthritis     right hip is most painful and affects mobility as far as ambulating steps  . Iron deficiency anemia, unspecified 12/25/2012   Current Outpatient Prescriptions on File Prior to Visit  Medication Sig Dispense Refill  . aspirin 81 MG tablet Take 81 mg by mouth daily.       Marland Kitchen docusate sodium (COLACE) 100 MG capsule Take 200 mg by mouth daily before breakfast.       . metoprolol (LOPRESSOR) 50 MG tablet Take 1 tablet (50 mg total) by mouth 2 (two) times daily.  60 tablet  5  . Calcium Carbonate-Vitamin D (CALCIUM 600/VITAMIN D) 600-400 MG-UNIT per chew tablet Chew 1 tablet by mouth 2 (two) times daily.      . Multiple Vitamin (MULITIVITAMIN WITH MINERALS) TABS Take 1 tablet by mouth daily at 12 noon. Spectrative Chewable       No current facility-administered medications on file prior to visit.   Allergies  Allergen Reactions  . Cephalexin Other (See Comments)    Affected pt's child -  Therefore , pt does not take Keflex.  . Codeine Nausea And Vomiting  . Levofloxacin Other (See Comments)    Hallucinations.  . Morphine And Related Nausea And Vomiting  . Other     Another antibiotic that starts with cina?  . Quinolones Other (See Comments)   Hallucinations.  . Sulfa Antibiotics Nausea And Vomiting  . Amoxicillin Nausea Only  . Fexofenadine Hcl Other (See Comments)    unknown reaction  . Floxin (Ocuflox) Other (See Comments)    unknown allergy  . Neurontin (Gabapentin) Palpitations  . Septra (Bactrim) Other (See Comments)    Unknown reaction   History   Social History  . Marital Status: Married    Spouse Name: N/A    Number of Children: N/A  . Years of Education: N/A   Occupational History  . Not on file.   Social History Main Topics  . Smoking status: Never Smoker   . Smokeless tobacco: Not on file  . Alcohol Use: No  . Drug Use: No  . Sexually Active:    Other Topics Concern  . Not on file   Social History Narrative  . No narrative on file     Review of Systems  All other systems reviewed and are negative.       Objective:   Physical Exam  Constitutional: She appears well-developed and well-nourished. No distress.  HENT:  Right Ear: External ear normal.  Left Ear: External ear normal.  Nose: Nose normal.  Mouth/Throat: Oropharynx is clear and moist. No oropharyngeal exudate.  Eyes: Conjunctivae are normal.  Neck:  Neck supple. No thyromegaly present.  Cardiovascular: Normal rate and regular rhythm.   Pulmonary/Chest: Effort normal. She has no rhonchi. She has rales in the right lower field and the left lower field.  Skin: She is not diaphoretic.          Assessment & Plan:  1. Acute bronchitis Patient is improving. Finish doxycycline and prednisone. Add Mucinex 400 mg by mouth every 4 hours until better. Add Zyrtec 10 mg by mouth daily for possible allergic trigger. If this becomes recurrent, we may want to perform testing to evaluate for an underlying asthma. Followup if not completely better in one week.

## 2013-03-28 ENCOUNTER — Telehealth: Payer: Self-pay | Admitting: *Deleted

## 2013-03-28 NOTE — Telephone Encounter (Signed)
S/w pt over 20 minutes via phone.  She expressed many concerns about her overall condition, including some wheezing she has had for over 9 months. She read that wheezing can be side effect of Feraheme.  Informed pt this can be acute side effect/ reaction, but not ongoing.  Instructed her to follow w/ PCP for the wheezing. She expressed a lot of concern over her Ferritin level being 4 on last lab.  Reminded pt this had been addressed by Dr. Gaylyn Rong and that's why she received Feraheme.  She also wanted to talk about her daughter's history of anemia.  Explained this does not alter her care here. She asked if Dr. Gaylyn Rong thinks she should take Vitamin D3, if this will help her iron.  Informed her not r/t low iron and to consult w/ PCP about Vitamin D.  She also wanted Dr. Gaylyn Rong to be aware she is not taking oral iron because it makes her constipated.  She asks if maybe she can try it once a day instead of twice daily?  Informed pt this is common side effect of oral iron and she can try once daily or even every other day and increase slowly as tolerated.  Pt also concerned about getting a very large bag of iron over 7 hrs the first few times and now only getting a very small bag.  Asks why her "dose was decreased?"  Explained this is a new iron formulation, not necessarily a smaller dose or less effective.  She also wanted to know which MD is going to take over for Dr. Gaylyn Rong when he leaves?  Informed that has not been decided yet.  She wants to be changed to another MD at [facility] and not have the "new doctor."  Gave her the phone number for Jule Ser to request change in physicians.   Assured pt we will continue to monitor her iron deficiency anemia as scheduled.  Call back if any further questions.  She verbalized understanding.

## 2013-03-28 NOTE — Telephone Encounter (Signed)
Pt left VM on 6/16 stating she had a question about letter that Dr. Gaylyn Rong sent.   Returned call today and left VM asking her to call back today and ask for Dr. Lodema Pilot nurse.

## 2013-03-31 ENCOUNTER — Other Ambulatory Visit: Payer: Medicare Other

## 2013-06-26 ENCOUNTER — Telehealth: Payer: Self-pay | Admitting: *Deleted

## 2013-06-26 NOTE — Telephone Encounter (Signed)
Patient calling to make sure she is scheduled for a cbc and ferritin levels. She is. Patient also wants to know why she is not seen more often and how do we know her iron level is not dropping , say 10 days from getting fereheme. Suggested she talk with MD re: these issues, at next visit.

## 2013-06-30 ENCOUNTER — Other Ambulatory Visit (HOSPITAL_BASED_OUTPATIENT_CLINIC_OR_DEPARTMENT_OTHER): Payer: Medicare Other

## 2013-06-30 DIAGNOSIS — D509 Iron deficiency anemia, unspecified: Secondary | ICD-10-CM

## 2013-06-30 LAB — CBC WITH DIFFERENTIAL/PLATELET
BASO%: 0.7 % (ref 0.0–2.0)
HCT: 26.7 % — ABNORMAL LOW (ref 34.8–46.6)
LYMPH%: 17.8 % (ref 14.0–49.7)
MCH: 25.4 pg (ref 25.1–34.0)
MCHC: 32.3 g/dL (ref 31.5–36.0)
MCV: 78.6 fL — ABNORMAL LOW (ref 79.5–101.0)
MONO#: 0.2 10*3/uL (ref 0.1–0.9)
NEUT%: 73.9 % (ref 38.4–76.8)
Platelets: 367 10*3/uL (ref 145–400)
WBC: 5.5 10*3/uL (ref 3.9–10.3)

## 2013-07-03 ENCOUNTER — Telehealth: Payer: Self-pay | Admitting: *Deleted

## 2013-07-03 ENCOUNTER — Telehealth: Payer: Self-pay | Admitting: Oncology

## 2013-07-03 ENCOUNTER — Other Ambulatory Visit: Payer: Self-pay | Admitting: *Deleted

## 2013-07-03 NOTE — Telephone Encounter (Signed)
Pt notified that Dr Bertis Ruddy would like to see her 07/05/13 at 11:30. Pt agreeable

## 2013-07-03 NOTE — Telephone Encounter (Signed)
Pt had left a message requesting her ferritin level. Wants to know if she is going to receive another infusion. States her "body is telling her that she needs something". Labs and last office visit printed for Dr Bertis Ruddy to review. Left patient a message that MD is reviewing her labs. RN will call her back with instructions

## 2013-07-05 ENCOUNTER — Other Ambulatory Visit: Payer: Self-pay | Admitting: Hematology and Oncology

## 2013-07-05 ENCOUNTER — Encounter: Payer: Self-pay | Admitting: Hematology and Oncology

## 2013-07-05 ENCOUNTER — Telehealth: Payer: Self-pay | Admitting: *Deleted

## 2013-07-05 ENCOUNTER — Ambulatory Visit (HOSPITAL_BASED_OUTPATIENT_CLINIC_OR_DEPARTMENT_OTHER): Payer: Medicare Other | Admitting: Hematology and Oncology

## 2013-07-05 ENCOUNTER — Other Ambulatory Visit: Payer: Medicare Other | Admitting: Lab

## 2013-07-05 ENCOUNTER — Telehealth: Payer: Self-pay | Admitting: Hematology and Oncology

## 2013-07-05 VITALS — BP 124/59 | HR 87 | Temp 97.8°F | Resp 20 | Ht 62.0 in | Wt 198.4 lb

## 2013-07-05 DIAGNOSIS — D509 Iron deficiency anemia, unspecified: Secondary | ICD-10-CM

## 2013-07-05 DIAGNOSIS — M129 Arthropathy, unspecified: Secondary | ICD-10-CM

## 2013-07-05 DIAGNOSIS — M1990 Unspecified osteoarthritis, unspecified site: Secondary | ICD-10-CM

## 2013-07-05 NOTE — Telephone Encounter (Signed)
gv and printed appt sched and avs for pt for Sep adn OCT....emailed Dr Bertis Ruddy for pof for iron...pt aware

## 2013-07-05 NOTE — Progress Notes (Signed)
[name] OFFICE PROGRESS NOTE  Melanie Grosser, MD [address] [place] [address]  DIAGNOSIS: Severe recurrent iron deficiency anemia and fatigue  SUMMARY OF HEMATOLOGIC HISTORY: A review of her records extensively and collaborated the history with the patient. The patient has been complaining of fatigue and was found to have severe iron deficiency anemia. Approximately a year or 2 ago, she received 2 units of blood transfusion because of this. She had extensive GI evaluation with EGD and colonoscopy and was never found to have any sort of GI bleed. She has received 4 intravenous doses of iron infusion and the last infusion in 3 months ago. Bone marrow aspirate and biopsy showed no evidence of malignancy or persistent absence of iron stores. INTERVAL HISTORY: Melanie [name] 78 y.o. female returns for further followup returning to her recurrent iron deficiency anemia. She's been complaining of profound fatigue requiring daily naps. She is still complaining of a pulled muscle on the left leg with difficulty straightening the knee and while she is now walking with a limp and that happened abruptly approximately 6 months ago. She denies any epistaxis, hematuria, or hematochezia. No recent abnormal weight loss, in fact she has gain some weight. She denies any new musculoskeletal pain. She also had recent pica and has been chewing excessive amount of ice. She denies any use of nonsteroidal anti-inflammatory medication over-the-counter.  I have reviewed the past medical history, past surgical history, social history and family history with the patient and they are unchanged from previous note.  ALLERGIES:  is allergic to cephalexin; codeine; levofloxacin; morphine and related; other; quinolones; sulfa antibiotics; amoxicillin; fexofenadine hcl; floxin; neurontin; and septra.  MEDICATIONS: Current outpatient prescriptions:aspirin 81 MG tablet, Take 81 mg by mouth daily. ,  Disp: , Rfl: ;  Calcium Carbonate-Vitamin D (CALCIUM 600/VITAMIN D) 600-400 MG-UNIT per chew tablet, Chew 1 tablet by mouth 2 (two) times daily., Disp: , Rfl: ;  docusate sodium (COLACE) 100 MG capsule, Take 200 mg by mouth daily before breakfast. , Disp: , Rfl:  metoprolol (LOPRESSOR) 50 MG tablet, Take 1 tablet (50 mg total) by mouth 2 (two) times daily., Disp: 60 tablet, Rfl: 5;  Multiple Vitamin (MULITIVITAMIN WITH MINERALS) TABS, Take 1 tablet by mouth daily at 12 noon. Spectrative Chewable, Disp: , Rfl:   REVIEW OF SYSTEMS:   Constitutional: Denies fevers, chills or abnormal weight loss Eyes: Denies blurriness of vision Ears, nose, mouth, throat, and face: Denies mucositis or sore throat Respiratory: Denies cough, dyspnea or wheezes Cardiovascular: Denies palpitation, chest discomfort or lower extremity swelling Gastrointestinal:  Denies nausea, heartburn or change in bowel habits Skin: Denies abnormal skin rashes Lymphatics: Denies new lymphadenopathy or easy bruising Neurological:Denies numbness, tingling or new weaknesses Behavioral/Psych: Mood is stable, no new changes  All other systems were reviewed with the patient and are negative.  PHYSICAL EXAMINATION: ECOG PERFORMANCE STATUS: 1 - Symptomatic but completely ambulatory  Filed Vitals:   07/05/13 1105  BP: 124/59  Pulse: 87  Temp: 97.8 F (36.6 C)  Resp: 20   Filed Weights   07/05/13 1105  Weight: 198 lb 6.4 oz (89.994 kg)     GENERAL:alert, no distress and comfortable patient looks very pale SKIN: skin color, texture, turgor are normal, no rashes or significant lesions EYES: normal, Conjunctiva are pale and non-injected, sclera clear OROPHARYNX:no exudate, no erythema and lips, buccal mucosa, and tongue normal  NECK: supple, thyroid normal size, non-tender, without nodularity LYMPH:  no palpable lymphadenopathy  in the cervical, axillary or inguinal LUNGS: clear to auscultation and percussion with normal breathing  effort HEART: regular rate & rhythm and no murmurs and no lower extremity edema ABDOMEN:abdomen soft, non-tender and normal bowel sounds Musculoskeletal:no cyanosis of digits and no clubbing. No contracture on the left leg with difficulty straightening her left knee NEURO: alert & oriented x 3 with fluent speech, no focal motor/sensory deficits  LABORATORY DATA:  I have reviewed the data as listed in note this she is iron deficient again  ASSESSMENT: Recurrent severe iron deficiency anemia   PLAN:  #1 recurrent severe iron deficiency anemia I'm hoping to avoid blood transfusion. I recommend intravenous iron infusion weekly x2, a week apart, of increasing her ferritin level to over 100 a hemoglobin above 10. She is to be followed closely. If she continued to lose iron, we may have to repeat an EGD and colonoscopy to rule out GI bleeding GI malignancy. I discussed with her risk benefits side effects of iron ferraheme versus iron sucrose. Patient ultimately elected to receive iron ferraheme I will see her back in 2 weeks with repeat blood counts and ferritin level #2 significant leg pain and contracture I recommend an orthopedist in evaluation of this and she is in agreement All questions were answered. The patient knows to call the clinic with any problems, questions or concerns. We can certainly see the patient much sooner if necessary. No barriers to learning was detected.  The patient and plan discussed with Melanie [name], Melanie [name]  and she is in agreement with the aforementioned.  I spent 35 minutes counseling the patient face to face. The total time spent in the appointment was 60 minutes and more than 50% was on counseling. Patient has a lot of questions today and spent most of the time reviewing plan of care     Melanie [name], Melanie Hammersmith, MD 07/05/2013 2:22 PM

## 2013-07-05 NOTE — Telephone Encounter (Signed)
Per staff message and POF I have scheduled appts.  JMW  

## 2013-07-07 ENCOUNTER — Ambulatory Visit (HOSPITAL_BASED_OUTPATIENT_CLINIC_OR_DEPARTMENT_OTHER): Payer: Medicare Other

## 2013-07-07 ENCOUNTER — Telehealth: Payer: Self-pay | Admitting: Hematology and Oncology

## 2013-07-07 VITALS — BP 130/75 | HR 85 | Temp 98.0°F | Resp 18

## 2013-07-07 DIAGNOSIS — D509 Iron deficiency anemia, unspecified: Secondary | ICD-10-CM

## 2013-07-07 MED ORDER — ACETAMINOPHEN 325 MG PO TABS
650.0000 mg | ORAL_TABLET | Freq: Once | ORAL | Status: AC
  Administered 2013-07-07: 650 mg via ORAL

## 2013-07-07 MED ORDER — DIPHENHYDRAMINE HCL 25 MG PO CAPS
25.0000 mg | ORAL_CAPSULE | Freq: Once | ORAL | Status: AC
  Administered 2013-07-07: 25 mg via ORAL

## 2013-07-07 MED ORDER — DIPHENHYDRAMINE HCL 25 MG PO CAPS
ORAL_CAPSULE | ORAL | Status: AC
  Filled 2013-07-07: qty 1

## 2013-07-07 MED ORDER — ACETAMINOPHEN 325 MG PO TABS
ORAL_TABLET | ORAL | Status: AC
  Filled 2013-07-07: qty 2

## 2013-07-07 MED ORDER — FERUMOXYTOL INJECTION 510 MG/17 ML
510.0000 mg | Freq: Once | INTRAVENOUS | Status: AC
  Administered 2013-07-07: 510 mg via INTRAVENOUS
  Filled 2013-07-07: qty 17

## 2013-07-07 NOTE — Progress Notes (Signed)
Patient to received Feraheme 510mg  on 07/07/13 and Feraheme 510mg  07/16/13, per Dr. Bertis Ruddy.

## 2013-07-07 NOTE — Telephone Encounter (Signed)
Gave pt appt for lab and MD and iron on 07/14/13

## 2013-07-07 NOTE — Patient Instructions (Addendum)
Ferumoxytol injection What is this medicine? FERUMOXYTOL is an iron complex. Iron is used to make healthy red blood cells, which carry oxygen and nutrients throughout the body. This medicine is used to treat iron deficiency anemia in people with chronic kidney disease. This medicine may be used for other purposes; ask your health care provider or pharmacist if you have questions. What should I tell my health care provider before I take this medicine? They need to know if you have any of these conditions: -anemia not caused by low iron levels -high levels of iron in the blood -magnetic resonance imaging (MRI) test scheduled -an unusual or allergic reaction to iron, other medicines, foods, dyes, or preservatives -pregnant or trying to get pregnant -breast-feeding How should I use this medicine? This medicine is for infusion into a vein. It is given by a health care professional in a hospital or clinic setting. Talk to your pediatrician regarding the use of this medicine in children. Special care may be needed. Overdosage: If you think you've taken too much of this medicine contact a poison control center or emergency room at once. Overdosage: If you think you have taken too much of this medicine contact a poison control center or emergency room at once. NOTE: This medicine is only for you. Do not share this medicine with others. What if I miss a dose? It is important not to miss your dose. Call your doctor or health care professional if you are unable to keep an appointment. What may interact with this medicine? This medicine may interact with the following medications: -other iron products This list may not describe all possible interactions. Give your health care provider a list of all the medicines, herbs, non-prescription drugs, or dietary supplements you use. Also tell them if you smoke, drink alcohol, or use illegal drugs. Some items may interact with your medicine. What should I watch  for while using this medicine? Visit your doctor or healthcare professional regularly. Tell your doctor or healthcare professional if your symptoms do not start to get better or if they get worse. You may need blood work done while you are taking this medicine. You may need to follow a special diet. Talk to your doctor. Foods that contain iron include: whole grains/cereals, dried fruits, beans, or peas, leafy green vegetables, and organ meats (liver, kidney). What side effects may I notice from receiving this medicine? Side effects that you should report to your doctor or health care professional as soon as possible: -allergic reactions like skin rash, itching or hives, swelling of the face, lips, or tongue -breathing problems -changes in blood pressure -feeling faint or lightheaded, falls -fever or chills -flushing, sweating, or hot feelings -swelling of the ankles or feet Side effects that usually do not require medical attention (Report these to your doctor or health care professional if they continue or are bothersome.): -diarrhea -headache -nausea, vomiting -stomach pain This list may not describe all possible side effects. Call your doctor for medical advice about side effects. You may report side effects to FDA at 1-800-FDA-1088. Where should I keep my medicine? This drug is given in a hospital or clinic and will not be stored at home. NOTE: This sheet is a summary. It may not cover all possible information. If you have questions about this medicine, talk to your doctor, pharmacist, or health care provider.  2012, [employer]vier/Gold Standard. (06/20/2008 9:48:25 PM) 

## 2013-07-10 ENCOUNTER — Other Ambulatory Visit: Payer: Self-pay | Admitting: Family Medicine

## 2013-07-14 ENCOUNTER — Ambulatory Visit (HOSPITAL_BASED_OUTPATIENT_CLINIC_OR_DEPARTMENT_OTHER): Payer: Medicare Other

## 2013-07-14 VITALS — BP 165/79 | HR 79 | Temp 97.2°F

## 2013-07-14 DIAGNOSIS — D509 Iron deficiency anemia, unspecified: Secondary | ICD-10-CM

## 2013-07-14 MED ORDER — DIPHENHYDRAMINE HCL 25 MG PO CAPS
ORAL_CAPSULE | ORAL | Status: AC
  Filled 2013-07-14: qty 1

## 2013-07-14 MED ORDER — SODIUM CHLORIDE 0.9 % IV SOLN
Freq: Once | INTRAVENOUS | Status: AC
  Administered 2013-07-14: 10:00:00 via INTRAVENOUS

## 2013-07-14 MED ORDER — FERUMOXYTOL INJECTION 510 MG/17 ML
510.0000 mg | Freq: Once | INTRAVENOUS | Status: AC
  Administered 2013-07-14: 510 mg via INTRAVENOUS
  Filled 2013-07-14: qty 17

## 2013-07-14 MED ORDER — ACETAMINOPHEN 325 MG PO TABS
ORAL_TABLET | ORAL | Status: AC
  Filled 2013-07-14: qty 2

## 2013-07-14 MED ORDER — ACETAMINOPHEN 325 MG PO TABS
650.0000 mg | ORAL_TABLET | Freq: Once | ORAL | Status: AC
  Administered 2013-07-14: 650 mg via ORAL

## 2013-07-14 MED ORDER — DIPHENHYDRAMINE HCL 25 MG PO CAPS
25.0000 mg | ORAL_CAPSULE | Freq: Once | ORAL | Status: AC
  Administered 2013-07-14: 25 mg via ORAL

## 2013-07-14 NOTE — Progress Notes (Signed)
1120- Pt requests to stay for 30 minutes post Feraheme to be observed.  Pt tolerated Feraheme with no difficulties or complaints.  Pt discharged to home with husband.

## 2013-07-14 NOTE — Patient Instructions (Addendum)
Ferumoxytol injection What is this medicine? FERUMOXYTOL is an iron complex. Iron is used to make healthy red blood cells, which carry oxygen and nutrients throughout the body. This medicine is used to treat iron deficiency anemia in people with chronic kidney disease. This medicine may be used for other purposes; ask your health care provider or pharmacist if you have questions. What should I tell my health care provider before I take this medicine? They need to know if you have any of these conditions: -anemia not caused by low iron levels -high levels of iron in the blood -magnetic resonance imaging (MRI) test scheduled -an unusual or allergic reaction to iron, other medicines, foods, dyes, or preservatives -pregnant or trying to get pregnant -breast-feeding How should I use this medicine? This medicine is for infusion into a vein. It is given by a health care professional in a hospital or clinic setting. Talk to your pediatrician regarding the use of this medicine in children. Special care may be needed. Overdosage: If you think you've taken too much of this medicine contact a poison control center or emergency room at once. Overdosage: If you think you have taken too much of this medicine contact a poison control center or emergency room at once. NOTE: This medicine is only for you. Do not share this medicine with others. What if I miss a dose? It is important not to miss your dose. Call your doctor or health care professional if you are unable to keep an appointment. What may interact with this medicine? This medicine may interact with the following medications: -other iron products This list may not describe all possible interactions. Give your health care provider a list of all the medicines, herbs, non-prescription drugs, or dietary supplements you use. Also tell them if you smoke, drink alcohol, or use illegal drugs. Some items may interact with your medicine. What should I watch  for while using this medicine? Visit your doctor or healthcare professional regularly. Tell your doctor or healthcare professional if your symptoms do not start to get better or if they get worse. You may need blood work done while you are taking this medicine. You may need to follow a special diet. Talk to your doctor. Foods that contain iron include: whole grains/cereals, dried fruits, beans, or peas, leafy green vegetables, and organ meats (liver, kidney). What side effects may I notice from receiving this medicine? Side effects that you should report to your doctor or health care professional as soon as possible: -allergic reactions like skin rash, itching or hives, swelling of the face, lips, or tongue -breathing problems -changes in blood pressure -feeling faint or lightheaded, falls -fever or chills -flushing, sweating, or hot feelings -swelling of the ankles or feet Side effects that usually do not require medical attention (Report these to your doctor or health care professional if they continue or are bothersome.): -diarrhea -headache -nausea, vomiting -stomach pain This list may not describe all possible side effects. Call your doctor for medical advice about side effects. You may report side effects to FDA at 1-800-FDA-1088. Where should I keep my medicine? This drug is given in a hospital or clinic and will not be stored at home. NOTE: This sheet is a summary. It may not cover all possible information. If you have questions about this medicine, talk to your doctor, pharmacist, or health care provider.  2012, [employer]vier/Gold Standard. (06/20/2008 9:48:25 PM) 

## 2013-07-19 ENCOUNTER — Encounter: Payer: Self-pay | Admitting: Hematology and Oncology

## 2013-07-19 ENCOUNTER — Telehealth: Payer: Self-pay | Admitting: Hematology and Oncology

## 2013-07-19 ENCOUNTER — Other Ambulatory Visit (HOSPITAL_BASED_OUTPATIENT_CLINIC_OR_DEPARTMENT_OTHER): Payer: Medicare Other | Admitting: Lab

## 2013-07-19 ENCOUNTER — Ambulatory Visit (HOSPITAL_BASED_OUTPATIENT_CLINIC_OR_DEPARTMENT_OTHER): Payer: Medicare Other | Admitting: Hematology and Oncology

## 2013-07-19 VITALS — BP 152/81 | HR 65 | Temp 97.9°F | Resp 20 | Ht 62.0 in | Wt 195.1 lb

## 2013-07-19 DIAGNOSIS — M549 Dorsalgia, unspecified: Secondary | ICD-10-CM

## 2013-07-19 DIAGNOSIS — D649 Anemia, unspecified: Secondary | ICD-10-CM

## 2013-07-19 DIAGNOSIS — D509 Iron deficiency anemia, unspecified: Secondary | ICD-10-CM

## 2013-07-19 LAB — CBC & DIFF AND RETIC
BASO%: 0.1 % (ref 0.0–2.0)
EOS%: 0.5 % (ref 0.0–7.0)
Eosinophils Absolute: 0.1 10*3/uL (ref 0.0–0.5)
HGB: 11 g/dL — ABNORMAL LOW (ref 11.6–15.9)
Immature Retic Fract: 12.5 % — ABNORMAL HIGH (ref 1.60–10.00)
MCV: 85.6 fL (ref 79.5–101.0)
MONO#: 0.6 10*3/uL (ref 0.1–0.9)
NEUT#: 9 10*3/uL — ABNORMAL HIGH (ref 1.5–6.5)
Platelets: 360 10*3/uL (ref 145–400)
RBC: 4.11 10*6/uL (ref 3.70–5.45)
RDW: 25.4 % — ABNORMAL HIGH (ref 11.2–14.5)
Retic %: 7.7 % — ABNORMAL HIGH (ref 0.70–2.10)
Retic Ct Abs: 316.47 10*3/uL — ABNORMAL HIGH (ref 33.70–90.70)
WBC: 10.9 10*3/uL — ABNORMAL HIGH (ref 3.9–10.3)
lymph#: 1.2 10*3/uL (ref 0.9–3.3)

## 2013-07-19 LAB — COMPREHENSIVE METABOLIC PANEL (CC13)
AST: 15 U/L (ref 5–34)
BUN: 24.9 mg/dL (ref 7.0–26.0)
Calcium: 11.2 mg/dL — ABNORMAL HIGH (ref 8.4–10.4)
Chloride: 106 mEq/L (ref 98–109)
Creatinine: 0.8 mg/dL (ref 0.6–1.1)
Glucose: 72 mg/dl (ref 70–140)

## 2013-07-19 LAB — LACTATE DEHYDROGENASE (CC13): LDH: 136 U/L (ref 125–245)

## 2013-07-19 LAB — HOLD TUBE, BLOOD BANK

## 2013-07-19 NOTE — Progress Notes (Signed)
[facility] OFFICE PROGRESS NOTE  [name],[name], MD DIAGNOSIS: Recurrent iron deficiency anemia, for further management  SUMMARY OF HEMATOLOGIC HISTORY: A review of her records extensively and collaborated the history with the patient. The patient has been complaining of fatigue and was found to have severe iron deficiency anemia. Approximately a year or 2 ago, she received 2 units of blood transfusion because of this. She had extensive GI evaluation with EGD and colonoscopy and was never found to have any sort of GI bleed. She has received 4 intravenous doses of iron infusion and the last infusion in 3 months ago. Bone marrow aspirate and biopsy showed no evidence of malignancy or persistent absence of iron stores. INTERVAL HISTORY: Melanie [name] 78 y.o. female returns for further followup. Since I saw her, we gave her 2 more iron infusion. Since then, the patient felt like a normal person. She had much more energy level. She denies any observed bleeding such as epistaxis, hematuria, or hematochezia. Denies any chest pain, shortness of breath, lightheadedness. I refer her to an orthopedist due to leg pain. She was prescribed prednisone which helped with her bone pain tremendously. She had an MRI pending in a few days to evaluate her back pain and sciatica type pain.  I have reviewed the past medical history, past surgical history, social history and family history with the patient and they are unchanged from previous note.  ALLERGIES:  is allergic to cephalexin; codeine; levofloxacin; morphine and related; other; quinolones; sulfa antibiotics; amoxicillin; fexofenadine hcl; floxin; neurontin; and septra.  MEDICATIONS: Current outpatient prescriptions:aspirin 81 MG tablet, Take 81 mg by mouth daily. , Disp: , Rfl: ;  Calcium Carbonate-Vitamin D (CALCIUM 600/VITAMIN D) 600-400 MG-UNIT per chew tablet, Chew 1 tablet by mouth 2 (two) times daily., Disp: , Rfl: ;  docusate sodium  (COLACE) 100 MG capsule, Take 200 mg by mouth daily before breakfast. , Disp: , Rfl:  metoprolol (LOPRESSOR) 50 MG tablet, TAKE 1 TABLET (50 MG TOTAL) BY MOUTH 2 (TWO) TIMES DAILY., Disp: 60 tablet, Rfl: 5;  Multiple Vitamin (MULITIVITAMIN WITH MINERALS) TABS, Take 1 tablet by mouth daily at 12 noon. Spectrative Chewable, Disp: , Rfl: ;  predniSONE (STERAPRED UNI-PAK) 10 MG tablet, Take 10 mg by mouth daily. Tapering dose x 12 days., Disp: , Rfl:   REVIEW OF SYSTEMS:   Constitutional: Denies fevers, chills or abnormal weight loss All other systems were reviewed with the patient and are negative.  PHYSICAL EXAMINATION: ECOG PERFORMANCE STATUS: 0 - Asymptomatic  Filed Vitals:   07/19/13 1424  BP: 152/81  Pulse: 65  Temp: 97.9 F (36.6 C)  Resp: 20   Filed Weights   07/19/13 1424  Weight: 195 lb 1.6 oz (88.497 kg)     GENERAL:alert, no distress and comfortable SKIN: skin color, texture, turgor are normal, no rashes or significant lesions NEURO: alert & oriented x 3 with fluent speech, no focal motor/sensory deficits  LABORATORY DATA:  I have reviewed the data as listed Results for orders placed in visit on 07/19/13 (from the past 48 hour(s))  CBC & DIFF AND RETIC     Status: Abnormal   Collection Time    07/19/13  1:50 PM      Result Value Range   WBC 10.9 (*) 3.9 - 10.3 10e3/uL   NEUT# 9.0 (*) 1.5 - 6.5 10e3/uL   HGB 11.0 (*) 11.6 - 15.9 g/dL   HCT 16.1  09.6 - 04.5 %   Platelets 360  145 - 400 10e3/uL   MCV 85.6  79.5 - 101.0 fL   MCH 26.8  25.1 - 34.0 pg   MCHC 31.3 (*) 31.5 - 36.0 g/dL   RBC 1.61  0.96 - 0.45 10e6/uL   RDW 25.4 (*) 11.2 - 14.5 %   lymph# 1.2  0.9 - 3.3 10e3/uL   MONO# 0.6  0.1 - 0.9 10e3/uL   Eosinophils Absolute 0.1  0.0 - 0.5 10e3/uL   Basophils Absolute 0.0  0.0 - 0.1 10e3/uL   NEUT% 82.6 (*) 38.4 - 76.8 %   LYMPH% 11.3 (*) 14.0 - 49.7 %   MONO% 5.5  0.0 - 14.0 %   EOS% 0.5  0.0 - 7.0 %   BASO% 0.1  0.0 - 2.0 %   Retic % 7.70 (*) 0.70 - 2.10 %    Retic Ct Abs 316.47 (*) 33.70 - 90.70 10e3/uL   Immature Retic Fract 12.50 (*) 1.60 - 10.00 %  COMPREHENSIVE METABOLIC PANEL (CC13)     Status: Abnormal   Collection Time    07/19/13  1:53 PM      Result Value Range   Sodium 142  136 - 145 mEq/L   Potassium 4.3  3.5 - 5.1 mEq/L   Chloride 106  98 - 109 mEq/L   CO2 30 (*) 22 - 29 mEq/L   Glucose 72  70 - 140 mg/dl   BUN 40.9  7.0 - 81.1 mg/dL   Creatinine 0.8  0.6 - 1.1 mg/dL   Total Bilirubin 9.14  0.20 - 1.20 mg/dL   Alkaline Phosphatase 81  40 - 150 U/L   AST 15  5 - 34 U/L   ALT 18  0 - 55 U/L   Total Protein 7.0  6.4 - 8.3 g/dL   Albumin 3.5  3.5 - 5.0 g/dL   Calcium 78.2 (*) 8.4 - 10.4 mg/dL   Anion Gap 6  3 - 11 mEq/L    ASSESSMENT: Recurrent iron deficiency anemia, resolved   PLAN:  #1 recurrent iron deficiency anemia The patient reportedly had extensive GI evaluation in the past. She required multiple units of blood transfusions and iron infusion. I gave her to iron infusion recently with significant improvement of her blood counts., Waiting for her ferritin level to come back from today. Let us see her back in a month's time and repeat CBC and ferritin level to determine how long it takes for her to deplete her iron store. If the ferritin stay about the same, and we can lengthen the future visit. However, if her ferritin dropped precipitously, I will send her back to the gastroenterologist for repeat GI evaluation. #2 back pain She has significant improvement in quality of life after prescription prednisone. She is currently following with an orthopedist and an MRI has been ordered. I will defer to them for further management. All questions were answered. The patient knows to call the clinic with any problems, questions or concerns. We can certainly see the patient much sooner if necessary. No barriers to learning was detected.  I spent 15 minutes counseling the patient face to face. The total time spent in the appointment  was 20 minutes and more than 50% was on counseling.     Helton Oleson, MD 07/19/2013 3:01 PM

## 2013-07-19 NOTE — Telephone Encounter (Signed)
Gave pt appt for lab  before Md visit on November 2014

## 2013-08-16 ENCOUNTER — Other Ambulatory Visit (HOSPITAL_BASED_OUTPATIENT_CLINIC_OR_DEPARTMENT_OTHER): Payer: Medicare Other

## 2013-08-16 DIAGNOSIS — D509 Iron deficiency anemia, unspecified: Secondary | ICD-10-CM

## 2013-08-16 DIAGNOSIS — D649 Anemia, unspecified: Secondary | ICD-10-CM

## 2013-08-16 LAB — CBC WITH DIFFERENTIAL/PLATELET
Eosinophils Absolute: 0.2 10*3/uL (ref 0.0–0.5)
LYMPH%: 19.8 % (ref 14.0–49.7)
MCHC: 31.2 g/dL — ABNORMAL LOW (ref 31.5–36.0)
MCV: 87.1 fL (ref 79.5–101.0)
MONO%: 5.9 % (ref 0.0–14.0)
NEUT#: 3.7 10*3/uL (ref 1.5–6.5)
Platelets: 337 10*3/uL (ref 145–400)
RBC: 3.79 10*6/uL (ref 3.70–5.45)

## 2013-08-16 LAB — FERRITIN CHCC: Ferritin: 28 ng/ml (ref 9–269)

## 2013-08-18 ENCOUNTER — Ambulatory Visit (HOSPITAL_BASED_OUTPATIENT_CLINIC_OR_DEPARTMENT_OTHER): Payer: Medicare Other | Admitting: Hematology and Oncology

## 2013-08-18 ENCOUNTER — Telehealth: Payer: Self-pay | Admitting: Hematology and Oncology

## 2013-08-18 ENCOUNTER — Encounter: Payer: Self-pay | Admitting: Hematology and Oncology

## 2013-08-18 ENCOUNTER — Ambulatory Visit (HOSPITAL_BASED_OUTPATIENT_CLINIC_OR_DEPARTMENT_OTHER): Payer: Medicare Other

## 2013-08-18 VITALS — BP 121/76 | HR 76 | Temp 97.6°F | Resp 20 | Wt 198.6 lb

## 2013-08-18 DIAGNOSIS — D509 Iron deficiency anemia, unspecified: Secondary | ICD-10-CM

## 2013-08-18 HISTORY — DX: Hypercalcemia: E83.52

## 2013-08-18 MED ORDER — ACETAMINOPHEN 325 MG PO TABS
650.0000 mg | ORAL_TABLET | Freq: Once | ORAL | Status: AC
  Administered 2013-08-18: 650 mg via ORAL

## 2013-08-18 MED ORDER — ACETAMINOPHEN 325 MG PO TABS
ORAL_TABLET | ORAL | Status: AC
  Filled 2013-08-18: qty 2

## 2013-08-18 MED ORDER — DIPHENHYDRAMINE HCL 25 MG PO TABS
25.0000 mg | ORAL_TABLET | Freq: Once | ORAL | Status: AC
  Administered 2013-08-18: 25 mg via ORAL
  Filled 2013-08-18: qty 1

## 2013-08-18 MED ORDER — DIPHENHYDRAMINE HCL 25 MG PO CAPS
ORAL_CAPSULE | ORAL | Status: AC
  Filled 2013-08-18: qty 1

## 2013-08-18 MED ORDER — SODIUM CHLORIDE 0.9 % IV SOLN
1020.0000 mg | Freq: Once | INTRAVENOUS | Status: AC
  Administered 2013-08-18: 1020 mg via INTRAVENOUS
  Filled 2013-08-18: qty 34

## 2013-08-18 NOTE — Patient Instructions (Signed)

## 2013-08-18 NOTE — Progress Notes (Signed)
[facility] OFFICE PROGRESS NOTE  [name],[name], MD DIAGNOSIS:  Recurrent iron deficiency anemia, recent hypercalcemia  SUMMARY OF HEMATOLOGIC HISTORY: A review of her records extensively and collaborated the history with the patient. The patient has been complaining of fatigue and was found to have severe iron deficiency anemia. Approximately a year or 2 ago, she received 2 units of blood transfusion because of this. She had extensive GI evaluation with EGD and colonoscopy and was never found to have any sort of GI bleed. She has received 4 intravenous doses of iron infusion and the last infusion in 3 months ago. Bone marrow aspirate and biopsy in January 2013 showed no evidence of malignancy or persistent absence of iron stores. INTERVAL HISTORY: Melanie [name] 78 y.o. female returns for further followup. She still have persistent fatigue. She denies any pica. Denies recent use of nonsteroidal anti-inflammatory medications. The patient denies any recent signs or symptoms of bleeding such as spontaneous epistaxis, hematuria or hematochezia. She denies any recent fever, chills, night sweats or abnormal weight loss  I have reviewed the past medical history, past surgical history, social history and family history with the patient and they are unchanged from previous note.  ALLERGIES:  is allergic to cephalexin; codeine; levofloxacin; morphine and related; other; quinolones; sulfa antibiotics; amoxicillin; fexofenadine hcl; floxin; neurontin; and septra.  MEDICATIONS:  Current Outpatient Prescriptions  Medication Sig Dispense Refill  . aspirin 81 MG tablet Take 81 mg by mouth daily.       Marland Kitchen docusate sodium (COLACE) 100 MG capsule Take 200 mg by mouth daily before breakfast.       . metoprolol (LOPRESSOR) 50 MG tablet TAKE 1 TABLET (50 MG TOTAL) BY MOUTH 2 (TWO) TIMES DAILY.  60 tablet  5   No current facility-administered medications for this visit.     REVIEW OF  SYSTEMS:   Constitutional: Denies fevers, chills or night sweats Gastrointestinal:  Denies nausea, heartburn or change in bowel habits Skin: Denies abnormal skin rashes Lymphatics: Denies new lymphadenopathy or easy bruising Neurological:Denies numbness, tingling or new weaknesses Behavioral/Psych: Mood is stable, no new changes  All other systems were reviewed with the patient and are negative.  PHYSICAL EXAMINATION: ECOG PERFORMANCE STATUS: 0 - Asymptomatic  Filed Vitals:   08/18/13 0952  BP: 121/76  Pulse: 76  Temp: 97.6 F (36.4 C)  Resp: 20   Filed Weights   08/18/13 0952  Weight: 198 lb 9.6 oz (90.084 kg)    GENERAL:alert, no distress and comfortable. She is obese SKIN: skin color, texture, turgor are normal, no rashes or significant lesions EYES: normal, Conjunctiva are pink and non-injected, sclera clear Musculoskeletal:no cyanosis of digits and no clubbing  NEURO: alert & oriented x 3 with fluent speech, no focal motor/sensory deficits  LABORATORY DATA:  I have reviewed the data as listed Results for orders placed in visit on 08/16/13 (from the past 48 hour(s))  FERRITIN [facility]     Status: None   Collection Time    08/16/13 10:26 AM      Result Value Range   Ferritin 28  9 - 269 ng/ml  CBC WITH DIFFERENTIAL     Status: Abnormal   Collection Time    08/16/13 10:26 AM      Result Value Range   WBC 5.3  3.9 - 10.3 10e3/uL   NEUT# 3.7  1.5 - 6.5 10e3/uL   HGB 10.3 (*) 11.6 - 15.9 g/dL   HCT 40.9 (*) 81.1 - 91.4 %  Platelets 337  145 - 400 10e3/uL   MCV 87.1  79.5 - 101.0 fL   MCH 27.2  25.1 - 34.0 pg   MCHC 31.2 (*) 31.5 - 36.0 g/dL   RBC 1.61  0.96 - 0.45 10e6/uL   RDW 21.2 (*) 11.2 - 14.5 %   lymph# 1.0  0.9 - 3.3 10e3/uL   MONO# 0.3  0.1 - 0.9 10e3/uL   Eosinophils Absolute 0.2  0.0 - 0.5 10e3/uL   Basophils Absolute 0.0  0.0 - 0.1 10e3/uL   NEUT% 70.3  38.4 - 76.8 %   LYMPH% 19.8  14.0 - 49.7 %   MONO% 5.9  0.0 - 14.0 %   EOS% 3.4  0.0 - 7.0 %    BASO% 0.6  0.0 - 2.0 %     ASSESSMENT:  #1 Recurrent iron deficiency anemia, GI bleed until proven otherwise #2 recent hypercalcemia PLAN:  #1 Recurrent iron deficiency anemia, GI bleed until proven otherwise This is likely anemia of from chronic blood loss.  The patient denies recent history of bleeding such as epistaxis, hematuria or hematochezia. She is asymptomatic from the anemia. We will observe for now.  She does not require transfusion now. Her last GI evaluation was 2 years ago. I do recommend repeat GI evaluation to look for source of GI bleed. The patient is very specific about not going back to her prior gastroenterologist for some reasons. She is going to let me know who should prefer not to go to. Once I have the name I will refer her to another GI specialist. I recommend intravenous iron infusion. We discussed some of the risks, benefits, and alternatives of intravenous iron infusions. The patient is symptomatic from anemia and the iron level is critically low. She tolerated oral iron supplement poorly and desires to achieved higher levels of iron faster for adequate hematopoesis. Some of the side-effects to be expected including risks of infusion reactions, phlebitis, headaches, nausea and fatigue.  The patient is willing to proceed  #2 recent hypercalcemia Her most recent bone marrow aspirate and biopsy January 2013 show no evidence of malignancy. I will order an additional workup to rule out multiple myeloma in her next visit. In the meantime, she had discontinue calcium supplements. All questions were answered. The patient knows to call the clinic with any problems, questions or concerns. No barriers to learning was detected.  Coleton Woon, MD 08/18/2013 10:14 AM

## 2013-08-18 NOTE — Telephone Encounter (Signed)
gv and printed appt sched and avs forpt for Dec...sed added tx for Dec  and NOV

## 2013-09-01 ENCOUNTER — Other Ambulatory Visit: Payer: Self-pay | Admitting: Family Medicine

## 2013-09-01 DIAGNOSIS — Z1231 Encounter for screening mammogram for malignant neoplasm of breast: Secondary | ICD-10-CM

## 2013-09-15 ENCOUNTER — Encounter: Payer: Self-pay | Admitting: Hematology and Oncology

## 2013-09-15 ENCOUNTER — Ambulatory Visit (HOSPITAL_BASED_OUTPATIENT_CLINIC_OR_DEPARTMENT_OTHER): Payer: Medicare Other | Admitting: Hematology and Oncology

## 2013-09-15 ENCOUNTER — Telehealth: Payer: Self-pay | Admitting: Hematology and Oncology

## 2013-09-15 ENCOUNTER — Other Ambulatory Visit (HOSPITAL_BASED_OUTPATIENT_CLINIC_OR_DEPARTMENT_OTHER): Payer: Medicare Other | Admitting: Lab

## 2013-09-15 ENCOUNTER — Ambulatory Visit (HOSPITAL_BASED_OUTPATIENT_CLINIC_OR_DEPARTMENT_OTHER): Payer: Medicare Other

## 2013-09-15 ENCOUNTER — Telehealth: Payer: Self-pay | Admitting: *Deleted

## 2013-09-15 ENCOUNTER — Encounter (INDEPENDENT_AMBULATORY_CARE_PROVIDER_SITE_OTHER): Payer: Self-pay

## 2013-09-15 DIAGNOSIS — D509 Iron deficiency anemia, unspecified: Secondary | ICD-10-CM

## 2013-09-15 DIAGNOSIS — D649 Anemia, unspecified: Secondary | ICD-10-CM

## 2013-09-15 LAB — CBC & DIFF AND RETIC
Basophils Absolute: 0 10*3/uL (ref 0.0–0.1)
HCT: 33.3 % — ABNORMAL LOW (ref 34.8–46.6)
HGB: 10.7 g/dL — ABNORMAL LOW (ref 11.6–15.9)
Immature Retic Fract: 12.9 % — ABNORMAL HIGH (ref 1.60–10.00)
LYMPH%: 19.7 % (ref 14.0–49.7)
MCH: 29 pg (ref 25.1–34.0)
MONO#: 0.4 10*3/uL (ref 0.1–0.9)
NEUT%: 70.6 % (ref 38.4–76.8)
Platelets: 290 10*3/uL (ref 145–400)
Retic Ct Abs: 153.5 10*3/uL — ABNORMAL HIGH (ref 33.70–90.70)
WBC: 5.5 10*3/uL (ref 3.9–10.3)
lymph#: 1.1 10*3/uL (ref 0.9–3.3)

## 2013-09-15 LAB — COMPREHENSIVE METABOLIC PANEL (CC13)
ALT: 13 U/L (ref 0–55)
Albumin: 3.6 g/dL (ref 3.5–5.0)
Anion Gap: 8 mEq/L (ref 3–11)
CO2: 27 mEq/L (ref 22–29)
Calcium: 10.9 mg/dL — ABNORMAL HIGH (ref 8.4–10.4)
Chloride: 108 mEq/L (ref 98–109)
Glucose: 95 mg/dl (ref 70–140)
Potassium: 4.4 mEq/L (ref 3.5–5.1)
Sodium: 143 mEq/L (ref 136–145)
Total Protein: 6.2 g/dL — ABNORMAL LOW (ref 6.4–8.3)

## 2013-09-15 LAB — IRON AND TIBC CHCC
TIBC: 300 ug/dL (ref 236–444)
UIBC: 245 ug/dL (ref 120–384)

## 2013-09-15 LAB — FERRITIN CHCC: Ferritin: 88 ng/ml (ref 9–269)

## 2013-09-15 MED ORDER — SODIUM CHLORIDE 0.9 % IV SOLN
1020.0000 mg | Freq: Once | INTRAVENOUS | Status: AC
  Administered 2013-09-15: 1020 mg via INTRAVENOUS
  Filled 2013-09-15: qty 34

## 2013-09-15 MED ORDER — DIPHENHYDRAMINE HCL 25 MG PO CAPS
ORAL_CAPSULE | ORAL | Status: AC
  Filled 2013-09-15: qty 1

## 2013-09-15 MED ORDER — ACETAMINOPHEN 325 MG PO TABS
ORAL_TABLET | ORAL | Status: AC
  Filled 2013-09-15: qty 2

## 2013-09-15 MED ORDER — ACETAMINOPHEN 325 MG PO TABS
650.0000 mg | ORAL_TABLET | Freq: Once | ORAL | Status: AC
  Administered 2013-09-15: 650 mg via ORAL

## 2013-09-15 MED ORDER — DIPHENHYDRAMINE HCL 25 MG PO CAPS
25.0000 mg | ORAL_CAPSULE | Freq: Once | ORAL | Status: AC
  Administered 2013-09-15: 25 mg via ORAL

## 2013-09-15 NOTE — Patient Instructions (Signed)

## 2013-09-15 NOTE — Telephone Encounter (Signed)
Per staff message and POF I have scheduled appts.  JMW  

## 2013-09-15 NOTE — Progress Notes (Signed)
[facility] OFFICE PROGRESS NOTE  [name],[name], MD DIAGNOSIS:  Persistent iron deficiency anemia, GI bleed until proven otherwise  SUMMARY OF HEMATOLOGIC HISTORY: A review of her records extensively and collaborated the history with the patient. The patient has been complaining of fatigue and was found to have severe iron deficiency anemia. Approximately a year or 2 ago, she received 2 units of blood transfusion because of this. She had extensive GI evaluation with EGD and colonoscopy and was never found to have any sort of GI bleed. She has received 4 intravenous doses of iron infusion and the last infusion in 3 months ago. Bone marrow aspirate and biopsy in January 2013 showed no evidence of malignancy or persistent absence of iron stores. INTERVAL HISTORY: Melanie [name] 78 y.o. female returns for further followup. The patient denies any recent signs or symptoms of bleeding such as spontaneous epistaxis, hematuria or hematochezia. She complained of persistent fatigue. Denies any leg cramps. Due to her back pain, she had MRI of the back done recently. According to her results was normal. The patient had severe diarrhea after the MRI scan. The patient was denied a GI evaluation due to recent normal EGD and colonoscopy 2 years ago. Her calcium supplement was placed on hold due to hypercalcemia. Further workup in progress.  I have reviewed the past medical history, past surgical history, social history and family history with the patient and they are unchanged from previous note.  ALLERGIES:  is allergic to cephalexin; codeine; levofloxacin; morphine and related; other; quinolones; sulfa antibiotics; amoxicillin; fexofenadine hcl; floxin; neurontin; and septra.  MEDICATIONS:  Current Outpatient Prescriptions  Medication Sig Dispense Refill  . aspirin 81 MG tablet Take 81 mg by mouth daily.       Marland Kitchen docusate sodium (COLACE) 100 MG capsule Take 200 mg by mouth daily before  breakfast.       . metoprolol (LOPRESSOR) 50 MG tablet TAKE 1 TABLET (50 MG TOTAL) BY MOUTH 2 (TWO) TIMES DAILY.  60 tablet  5   No current facility-administered medications for this visit.     REVIEW OF SYSTEMS:   Constitutional: Denies fevers, chills or night sweats Eyes: Denies blurriness of vision Ears, nose, mouth, throat, and face: Denies mucositis or sore throat Respiratory: Denies cough, dyspnea or wheezes Cardiovascular: Denies palpitation, chest discomfort or lower extremity swelling Gastrointestinal:  Denies nausea, heartburn or change in bowel habits Skin: Denies abnormal skin rashes Lymphatics: Denies new lymphadenopathy or easy bruising Neurological:Denies numbness, tingling or new weaknesses Behavioral/Psych: Mood is stable, no new changes  All other systems were reviewed with the patient and are negative.  PHYSICAL EXAMINATION: ECOG PERFORMANCE STATUS: 1 - Symptomatic but completely ambulatory  Filed Vitals:   09/15/13 1156  BP: 156/75  Temp: 98.5 F (36.9 C)   Filed Weights   09/15/13 1156  Weight: 197 lb 6.4 oz (89.54 kg)    GENERAL:alert, no distress and comfortable SKIN: skin color, texture, turgor are normal, no rashes or significant lesions EYES: normal, Conjunctiva are pink and non-injected, sclera clear OROPHARYNX:no exudate, no erythema and lips, buccal mucosa, and tongue normal  NECK: supple, thyroid normal size, non-tender, without nodularity LYMPH:  no palpable lymphadenopathy in the cervical, axillary or inguinal LUNGS: clear to auscultation and percussion with normal breathing effort HEART: regular rate & rhythm and no murmurs and no lower extremity edema ABDOMEN:abdomen soft, non-tender and normal bowel sounds Musculoskeletal:no cyanosis of digits and no clubbing  NEURO: alert & oriented x 3 with  fluent speech, no focal motor/sensory deficits  LABORATORY DATA:  I have reviewed the data as listed Results for orders placed in visit on  09/15/13 (from the past 48 hour(s))  CBC & DIFF AND RETIC     Status: Abnormal   Collection Time    09/15/13 11:26 AM      Result Value Range   WBC 5.5  3.9 - 10.3 10e3/uL   NEUT# 3.9  1.5 - 6.5 10e3/uL   HGB 10.7 (*) 11.6 - 15.9 g/dL   HCT 30.8 (*) 65.7 - 84.6 %   Platelets 290  145 - 400 10e3/uL   MCV 90.2  79.5 - 101.0 fL   MCH 29.0  25.1 - 34.0 pg   MCHC 32.1  31.5 - 36.0 g/dL   RBC 9.62 (*) 9.52 - 8.41 10e6/uL   RDW 18.7 (*) 11.2 - 14.5 %   lymph# 1.1  0.9 - 3.3 10e3/uL   MONO# 0.4  0.1 - 0.9 10e3/uL   Eosinophils Absolute 0.2  0.0 - 0.5 10e3/uL   Basophils Absolute 0.0  0.0 - 0.1 10e3/uL   NEUT% 70.6  38.4 - 76.8 %   LYMPH% 19.7  14.0 - 49.7 %   MONO% 6.3  0.0 - 14.0 %   EOS% 2.9  0.0 - 7.0 %   BASO% 0.5  0.0 - 2.0 %   Retic % 4.16 (*) 0.70 - 2.10 %   Retic Ct Abs 153.50 (*) 33.70 - 90.70 10e3/uL   Immature Retic Fract 12.90 (*) 1.60 - 10.00 %  FERRITIN [facility]     Status: None   Collection Time    09/15/13 11:26 AM      Result Value Range   Ferritin 88  9 - 269 ng/ml  IRON AND TIBC [facility]     Status: Abnormal   Collection Time    09/15/13 11:26 AM      Result Value Range   Iron 55  41 - 142 ug/dL   TIBC 324  401 - 027 ug/dL   UIBC 253  664 - 403 ug/dL   %SAT 18 (*) 21 - 57 %  COMPREHENSIVE METABOLIC PANEL (CC13)     Status: Abnormal   Collection Time    09/15/13 12:16 PM      Result Value Range   Sodium 143  136 - 145 mEq/L   Potassium 4.4  3.5 - 5.1 mEq/L   Chloride 108  98 - 109 mEq/L   CO2 27  22 - 29 mEq/L   Glucose 95  70 - 140 mg/dl   BUN 47.4  7.0 - 25.9 mg/dL   Creatinine 0.8  0.6 - 1.1 mg/dL   Total Bilirubin 5.63  0.20 - 1.20 mg/dL   Alkaline Phosphatase 80  40 - 150 U/L   AST 16  5 - 34 U/L   ALT 13  0 - 55 U/L   Total Protein 6.2 (*) 6.4 - 8.3 g/dL   Albumin 3.6  3.5 - 5.0 g/dL   Calcium 87.5 (*) 8.4 - 10.4 mg/dL   Anion Gap 8  3 - 11 mEq/L    Lab Results  Component Value Date   WBC 5.5 09/15/2013   HGB 10.7* 09/15/2013   HCT 33.3* 09/15/2013    MCV 90.2 09/15/2013   PLT 290 09/15/2013    ASSESSMENT & PLAN:  #1 Recurrent iron deficiency anemia, GI bleed until proven otherwise This is likely anemia of from chronic blood loss.  The patient denies  recent history of bleeding such as epistaxis, hematuria or hematochezia. She is asymptomatic from the anemia. We will observe for now.  She does not require transfusion now. Her last GI evaluation was 2 years ago. I do recommend repeat GI evaluation to look for source of GI bleed. The patient is very specific about not going back to her prior gastroenterologist for some reasons.  I recommend intravenous iron infusion. Due to her profound iron deficiency, despite multiple iron infusion, if we do not proceed with another iron infusion, I suspect she will get low to the point she may need blood transfusion. We discussed some of the risks, benefits, and alternatives of intravenous iron infusions. The patient is symptomatic from anemia and the iron level is critically low. She tolerated oral iron supplement poorly and desires to achieved higher levels of iron faster for adequate hematopoesis. Some of the side-effects to be expected including risks of infusion reactions, phlebitis, headaches, nausea and fatigue.  The patient is willing to proceed  #2 recent hypercalcemia Her most recent bone marrow aspirate and biopsy January 2013 show no evidence of malignancy. I will order an additional workup to rule out multiple myeloma in her next visit. In the meantime, she had discontinue calcium supplements.  All questions were answered. The patient knows to call the clinic with any problems, questions or concerns. No barriers to learning was detected.  I spent 25 minutes counseling the patient face to face. The total time spent in the appointment was 40 minutes and more than 50% was on counseling.     Jamaree Hosier, MD 09/15/2013 1:09 PM

## 2013-09-15 NOTE — Telephone Encounter (Signed)
Gave pt appt for lab,md and IV Iron  for January 2015

## 2013-09-19 ENCOUNTER — Ambulatory Visit (HOSPITAL_COMMUNITY)
Admission: RE | Admit: 2013-09-19 | Discharge: 2013-09-19 | Disposition: A | Discharge status: Home / Self Care | Payer: Medicare Other | Source: Ambulatory Visit | Attending: Family Medicine | Admitting: Family Medicine

## 2013-09-19 DIAGNOSIS — Z1231 Encounter for screening mammogram for malignant neoplasm of breast: Secondary | ICD-10-CM | POA: Insufficient documentation

## 2013-09-19 LAB — SPEP & IFE WITH QIG
Albumin ELP: 61.7 % (ref 55.8–66.1)
Alpha-1-Globulin: 5 % — ABNORMAL HIGH (ref 2.9–4.9)
Alpha-2-Globulin: 10.4 % (ref 7.1–11.8)
Beta 2: 4.5 % (ref 3.2–6.5)
Beta Globulin: 7 % (ref 4.7–7.2)

## 2013-09-19 LAB — KAPPA/LAMBDA LIGHT CHAINS
Kappa free light chain: 1.43 mg/dL (ref 0.33–1.94)
Kappa:Lambda Ratio: 0.99 (ref 0.26–1.65)

## 2013-09-21 ENCOUNTER — Encounter: Payer: Self-pay | Admitting: Gastroenterology

## 2013-09-28 ENCOUNTER — Telehealth: Payer: Self-pay | Admitting: *Deleted

## 2013-09-28 ENCOUNTER — Encounter: Payer: Self-pay | Admitting: Gastroenterology

## 2013-09-28 ENCOUNTER — Ambulatory Visit (INDEPENDENT_AMBULATORY_CARE_PROVIDER_SITE_OTHER): Payer: Medicare Other | Admitting: Gastroenterology

## 2013-09-28 ENCOUNTER — Other Ambulatory Visit: Payer: Medicare Other

## 2013-09-28 VITALS — BP 130/72 | HR 72 | Ht 62.0 in | Wt 195.0 lb

## 2013-09-28 DIAGNOSIS — K5521 Angiodysplasia of colon with hemorrhage: Secondary | ICD-10-CM

## 2013-09-28 DIAGNOSIS — Z9049 Acquired absence of other specified parts of digestive tract: Secondary | ICD-10-CM

## 2013-09-28 DIAGNOSIS — D649 Anemia, unspecified: Secondary | ICD-10-CM

## 2013-09-28 DIAGNOSIS — Q2733 Arteriovenous malformation of digestive system vessel: Secondary | ICD-10-CM

## 2013-09-28 DIAGNOSIS — Z9089 Acquired absence of other organs: Secondary | ICD-10-CM

## 2013-09-28 NOTE — Telephone Encounter (Signed)
Dr. Jarold Motto wants you to talk to Dr. Rhea Belton about doing a colonoscopy on this patient at the hospital with double prep

## 2013-09-28 NOTE — Patient Instructions (Addendum)
We will call you back to schedule colonoscopy.  Your physician has requested that you go to the basement for the following lab work before leaving today: Celiac Panel

## 2013-09-28 NOTE — Progress Notes (Signed)
History of Present Illness:  This is a very verbose 78 year old patient from [place] and has a long history of chronic iron deficiency anemia of unexplained etiology.  However, on reviewing her records, it appears that she has had a cecal AVMs seen him a couple of occasions with colonoscopies last performed by Dr. Elnoria Howard in December of 2012.  It appears that there is no electrocautery or laser therapy performed on these AVMs.  She continues with iron loss with periodic iron infusions last performed 2 weeks ago.  She apparently been in the emergency room in the last several months of iron deficiency and is had a blood transfusion.  His recent lab data from Dr. Tanya Nones on 08/18/2013 showed a hemoglobin of 10.3 hematocrit 33.  Patient is been unable to tolerate by mouth iron cause of severe abdominal pain.  Her appetite is good her weight is stable she denies upper GI complaints.  She has" hemorrhoids", e some bright red blood per rectum, but denies blood mixed with her stools or melena.  She has no GI symptoms whatsoever.  Also has no known diagnosed cardiac disease.  She takes aspirin 81 mg a day along with a stool softener and Metaprel 50 mg twice a day for cardiac palpitations.  She is not on other anticoagulants.  She specifically denies acid reflux, dysphagia, use of other NSAIDs, alcohol or cigarette abuse.  She also had a negative endoscopy in December of 2012 by Dr. Elnoria Howard, family history is noncontributory.  The patient has refused pill camera exam in the past because of"[place]'s Problems" which I suspect meansshe is afraid to have outpatient endoscopic procedures.  She does not want to return to the care of Dr. Elnoria Howard because of personality conflicts and also because of no previous therapeutic attempts at treating her AVMs.  I have reviewed this patient's present history, medical and surgical past history, allergies and medications.     ROS:   All systems were reviewed and are negative  unless otherwise stated in the HPI.. many functional complaints of anxiety, degenerative arthritis, depression, hearing difficulties, itching, recurrent skin rashes, urinary incontinency and occasional urinary tract infections.  She also gives a vague history of sleep apnea, hypertension, previous diverticulitis, mild COPD, excessive belching, burping, and flatus production.  Allergies  Allergen Reactions  . Cephalexin Other (See Comments)    Affected pt's child -  Therefore , pt does not take Keflex.  . Codeine Nausea And Vomiting  . Levofloxacin Other (See Comments)    Hallucinations.  . Morphine And Related Nausea And Vomiting  . Other     Another antibiotic that starts with cina?  . Quinolones Other (See Comments)    Hallucinations.  . Sulfa Antibiotics Nausea And Vomiting  . Amoxicillin Nausea Only  . Fexofenadine Hcl Other (See Comments)    unknown reaction  . Floxin [Ocuflox] Other (See Comments)    unknown allergy  . Neurontin [Gabapentin] Palpitations  . Septra [Bactrim] Other (See Comments)    Unknown reaction   Outpatient Prescriptions Prior to Visit  Medication Sig Dispense Refill  . aspirin 81 MG tablet Take 81 mg by mouth daily.       Marland Kitchen docusate sodium (COLACE) 100 MG capsule Take 200 mg by mouth daily before breakfast.       . metoprolol (LOPRESSOR) 50 MG tablet TAKE 1 TABLET (50 MG TOTAL) BY MOUTH 2 (TWO) TIMES DAILY.  60 tablet  5   No facility-administered medications prior to visit.  Past Medical History  Diagnosis Date  . Hypertension   . Allergy   . Hiatal hernia   . Anemia   . Cataract   . Diverticula of colon   . Arthritis     right hip is most painful and affects mobility as far as ambulating steps  . Iron deficiency anemia, unspecified 12/25/2012  . Hypercalcemia 08/18/2013   Past Surgical History  Procedure Laterality Date  . Cholecystectomy    . Tonsillectomy    . Cataract extraction      bilat 2008   History   Social History  . Marital  Status: Married    Spouse Name: N/A    Number of Children: 3  . Years of Education: N/A   Occupational History  . Retired    Social History Main Topics  . Smoking status: Never Smoker   . Smokeless tobacco: Never Used  . Alcohol Use: No  . Drug Use: No  . Sexual Activity: None   Other Topics Concern  . None   Social History Narrative  . None   Family History  Problem Relation Age of Onset  . Asthma Other   . Hypertension Other   . Hyperlipidemia Other   . Stroke Other   . Diabetes Mother   . Heart disease Mother   . Heart disease Father   . Diverticulosis Mother        Physical Exam: Blood pressure 130/72, pulse 72 and regular and weight 195.  BMI of 35.66. General well developed well nourished patient in no acute distress, appearing their stated age Eyes PERRLA, no icterus, fundoscopic exam per opthamologist Skin no lesions noted Neck supple, no adenopathy, no thyroid enlargement, no tenderness Chest clear to percussion and auscultation Heart no significant murmurs, gallops or rubs noted Abdomen no hepatosplenomegaly masses or tenderness, BS normal.  Rectal inspection normal no fissures, or fistulae noted.  No masses or tenderness on digital exam. Stool guaiac negative. Extremities no acute joint lesions, edema, phlebitis or evidence of cellulitis. Neurologic patient oriented x 3, cranial nerves intact, no focal neurologic deficits noted. Psychological mental status normal and normal affect.  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  She is a very difficult patient to deal with the cause of personality issues and strong opinions.  She is demented her procedure be done in hospital, and I think this is best done in in this setting so that ERBE laser treatment can be applied to any AVMs which are noted.  As above, she has refused pill camera exam.  Hopefully the time of her colonoscopy with Dr. Rhea Belton he can  visualize also her terminal ileum.  Also as above, she cannot tolerate by mouth iron and received outpatient transfusions periodically.  I did send her by the lab today check a celiac panel.  She is status post cholecystectomy.

## 2013-09-28 NOTE — Telephone Encounter (Signed)
Dr Rhea Belton, will you do a COLON at hospital on this pt in the future? Thanks.

## 2013-09-29 ENCOUNTER — Other Ambulatory Visit: Payer: Medicare Other

## 2013-09-29 LAB — CELIAC PANEL 10
Gliadin IgA: 6.9 U/mL (ref <20)
Gliadin IgG: 5.4 U/mL (ref <20)
IgA: 120 mg/dL (ref 69–380)

## 2013-10-02 ENCOUNTER — Telehealth: Payer: Self-pay | Admitting: Gastroenterology

## 2013-10-02 NOTE — Telephone Encounter (Signed)
Informed pt she is negative for Celiac Diease; pt stated understanding.

## 2013-10-09 NOTE — Telephone Encounter (Signed)
I would like to review Dr. Haywood Pao previous colonoscopies, the official report and any pathology I'm okay performing colonoscopy in the hospital setting but I would like to see her in clinic first sometime in January Colonoscopy can be tentatively scheduled for my next hospital week, in very early February with MAC

## 2013-10-11 NOTE — Telephone Encounter (Signed)
Pt will call Dr Haywood Pao ofc for her old procedures; she also has a copy that she will bring. She agrees to meet Dr Rhea Belton prior to a procedure; 11/08/13

## 2013-10-12 DIAGNOSIS — K269 Duodenal ulcer, unspecified as acute or chronic, without hemorrhage or perforation: Secondary | ICD-10-CM

## 2013-10-12 HISTORY — DX: Duodenal ulcer, unspecified as acute or chronic, without hemorrhage or perforation: K26.9

## 2013-10-16 ENCOUNTER — Telehealth: Payer: Self-pay | Admitting: Hematology and Oncology

## 2013-10-16 ENCOUNTER — Ambulatory Visit: Payer: Medicare Other

## 2013-10-16 ENCOUNTER — Encounter: Payer: Self-pay | Admitting: Hematology and Oncology

## 2013-10-16 ENCOUNTER — Ambulatory Visit (HOSPITAL_BASED_OUTPATIENT_CLINIC_OR_DEPARTMENT_OTHER): Payer: Medicare Other | Admitting: Hematology and Oncology

## 2013-10-16 ENCOUNTER — Other Ambulatory Visit (HOSPITAL_BASED_OUTPATIENT_CLINIC_OR_DEPARTMENT_OTHER): Payer: Medicare Other

## 2013-10-16 ENCOUNTER — Encounter (INDEPENDENT_AMBULATORY_CARE_PROVIDER_SITE_OTHER): Payer: Self-pay

## 2013-10-16 VITALS — BP 123/73 | HR 73 | Temp 97.3°F | Resp 18 | Ht 62.0 in | Wt 197.9 lb

## 2013-10-16 DIAGNOSIS — D649 Anemia, unspecified: Secondary | ICD-10-CM

## 2013-10-16 DIAGNOSIS — D500 Iron deficiency anemia secondary to blood loss (chronic): Secondary | ICD-10-CM

## 2013-10-16 DIAGNOSIS — K922 Gastrointestinal hemorrhage, unspecified: Secondary | ICD-10-CM

## 2013-10-16 LAB — CBC & DIFF AND RETIC
BASO%: 0.2 % (ref 0.0–2.0)
Basophils Absolute: 0 10*3/uL (ref 0.0–0.1)
EOS ABS: 0.2 10*3/uL (ref 0.0–0.5)
EOS%: 3.7 % (ref 0.0–7.0)
HEMATOCRIT: 33.7 % — AB (ref 34.8–46.6)
HGB: 11 g/dL — ABNORMAL LOW (ref 11.6–15.9)
Immature Retic Fract: 6.4 % (ref 1.60–10.00)
LYMPH%: 19.1 % (ref 14.0–49.7)
MCH: 30 pg (ref 25.1–34.0)
MCHC: 32.6 g/dL (ref 31.5–36.0)
MCV: 91.8 fL (ref 79.5–101.0)
MONO#: 0.3 10*3/uL (ref 0.1–0.9)
MONO%: 6.6 % (ref 0.0–14.0)
NEUT#: 2.9 10*3/uL (ref 1.5–6.5)
NEUT%: 70.4 % (ref 38.4–76.8)
Platelets: 251 10*3/uL (ref 145–400)
RBC: 3.67 10*6/uL — ABNORMAL LOW (ref 3.70–5.45)
RDW: 14.4 % (ref 11.2–14.5)
Retic %: 3.21 % — ABNORMAL HIGH (ref 0.70–2.10)
Retic Ct Abs: 117.81 10*3/uL — ABNORMAL HIGH (ref 33.70–90.70)
WBC: 4.1 10*3/uL (ref 3.9–10.3)
lymph#: 0.8 10*3/uL — ABNORMAL LOW (ref 0.9–3.3)
nRBC: 0 % (ref 0–0)

## 2013-10-16 LAB — COMPREHENSIVE METABOLIC PANEL (CC13)
ALT: 15 U/L (ref 0–55)
ANION GAP: 9 meq/L (ref 3–11)
AST: 13 U/L (ref 5–34)
Albumin: 3.5 g/dL (ref 3.5–5.0)
Alkaline Phosphatase: 77 U/L (ref 40–150)
BUN: 22.1 mg/dL (ref 7.0–26.0)
CALCIUM: 10.3 mg/dL (ref 8.4–10.4)
CHLORIDE: 110 meq/L — AB (ref 98–109)
CO2: 23 meq/L (ref 22–29)
Creatinine: 0.8 mg/dL (ref 0.6–1.1)
Glucose: 126 mg/dl (ref 70–140)
Potassium: 4.2 mEq/L (ref 3.5–5.1)
SODIUM: 143 meq/L (ref 136–145)
TOTAL PROTEIN: 6.1 g/dL — AB (ref 6.4–8.3)
Total Bilirubin: 0.25 mg/dL (ref 0.20–1.20)

## 2013-10-16 LAB — IRON AND TIBC CHCC
%SAT: 28 % (ref 21–57)
IRON: 75 ug/dL (ref 41–142)
TIBC: 265 ug/dL (ref 236–444)
UIBC: 190 ug/dL (ref 120–384)

## 2013-10-16 LAB — LACTATE DEHYDROGENASE (CC13): LDH: 133 U/L (ref 125–245)

## 2013-10-16 LAB — FERRITIN CHCC: FERRITIN: 110 ng/mL (ref 9–269)

## 2013-10-16 NOTE — Progress Notes (Signed)
[facility] OFFICE PROGRESS NOTE  [name],[name], MD DIAGNOSIS:  Persistent iron deficiency anemia, chronic GI bleed until proven otherwise  SUMMARY OF HEMATOLOGIC HISTORY: A review of her records extensively and collaborated the history with the patient. The patient has been complaining of fatigue and was found to have severe iron deficiency anemia. Approximately a year or 2 ago, she received 2 units of blood transfusion because of this. She had extensive GI evaluation with EGD and colonoscopy and was never found to have any sort of GI bleed. She has received 4 intravenous doses of iron infusion and the last infusion in 3 months ago. Bone marrow aspirate and biopsy in January 2013 showed no evidence of malignancy or persistent absence of iron stores. In 2014, she received 6 doses of intravenous iron infusion. She was referred to gastroenterology for further evaluation due to suspected GI bleed INTERVAL HISTORY: Daneil Dolin 79 y.o. female returns for further followup. The patient denies any recent signs or symptoms of bleeding such as spontaneous epistaxis, hematuria or hematochezia. She denies any new symptoms today. Denies any dizziness, chest pain or shortness of breath  I have reviewed the past medical history, past surgical history, social history and family history with the patient and they are unchanged from previous note.  ALLERGIES:  is allergic to cephalexin; codeine; levofloxacin; morphine and related; other; quinolones; sulfa antibiotics; amoxicillin; fexofenadine hcl; floxin; neurontin; and septra.  MEDICATIONS:  Current Outpatient Prescriptions  Medication Sig Dispense Refill  . aspirin 81 MG tablet Take 81 mg by mouth daily.       Marland Kitchen docusate sodium (COLACE) 100 MG capsule Take 200 mg by mouth daily before breakfast.       . metoprolol (LOPRESSOR) 50 MG tablet TAKE 1 TABLET (50 MG TOTAL) BY MOUTH 2 (TWO) TIMES DAILY.  60 tablet  5   No current  facility-administered medications for this visit.     REVIEW OF SYSTEMS:   Constitutional: Denies fevers, chills or night sweats Eyes: Denies blurriness of vision Ears, nose, mouth, throat, and face: Denies mucositis or sore throat Respiratory: Denies cough, dyspnea or wheezes Cardiovascular: Denies palpitation, chest discomfort or lower extremity swelling Gastrointestinal:  Denies nausea, heartburn or change in bowel habits Skin: Denies abnormal skin rashes Lymphatics: Denies new lymphadenopathy or easy bruising Neurological:Denies numbness, tingling or new weaknesses Behavioral/Psych: Mood is stable, no new changes  All other systems were reviewed with the patient and are negative.  PHYSICAL EXAMINATION: ECOG PERFORMANCE STATUS: 0 - Asymptomatic  Filed Vitals:   10/16/13 1213  BP: 123/73  Pulse: 73  Temp: 97.3 F (36.3 C)  Resp: 18   Filed Weights   10/16/13 1213  Weight: 197 lb 14.4 oz (89.767 kg)    GENERAL:alert, no distress and comfortable SKIN: skin color, texture, turgor are normal, no rashes or significant lesions Musculoskeletal:no cyanosis of digits and no clubbing  NEURO: alert & oriented x 3 with fluent speech, no focal motor/sensory deficits  LABORATORY DATA:  I have reviewed the data as listed Results for orders placed in visit on 10/16/13 (from the past 48 hour(s))  [facility]ERR[facility]IN [facility]     Status: None   Collection Time    10/16/13 11:58 AM      Result Value Range   Ferritin 110  9 - 269 ng/ml  IRON AND TIBC [facility]     Status: None   Collection Time    10/16/13 11:58 AM      Result Value Range  Iron 75  41 - 142 ug/dL   TIBC 265  236 - 444 ug/dL   UIBC 190  120 - 384 ug/dL   %SAT 28  21 - 57 %  CBC & DIFF AND RETIC     Status: Abnormal   Collection Time    10/16/13 11:58 AM      Result Value Range   WBC 4.1  3.9 - 10.3 10e3/uL   NEUT# 2.9  1.5 - 6.5 10e3/uL   HGB 11.0 (*) 11.6 - 15.9 g/dL   HCT 33.7 (*) 34.8 - 46.6 %   Platelets 251  145 - 400  10e3/uL   MCV 91.8  79.5 - 101.0 fL   MCH 30.0  25.1 - 34.0 pg   MCHC 32.6  31.5 - 36.0 g/dL   RBC 3.67 (*) 3.70 - 5.45 10e6/uL   RDW 14.4  11.2 - 14.5 %   lymph# 0.8 (*) 0.9 - 3.3 10e3/uL   MONO# 0.3  0.1 - 0.9 10e3/uL   Eosinophils Absolute 0.2  0.0 - 0.5 10e3/uL   Basophils Absolute 0.0  0.0 - 0.1 10e3/uL   NEUT% 70.4  38.4 - 76.8 %   LYMPH% 19.1  14.0 - 49.7 %   MONO% 6.6  0.0 - 14.0 %   EOS% 3.7  0.0 - 7.0 %   BASO% 0.2  0.0 - 2.0 %   nRBC 0  0 - 0 %   Retic % 3.21 (*) 0.70 - 2.10 %   Retic Ct Abs 117.81 (*) 33.70 - 90.70 10e3/uL   Immature Retic Fract 6.40  1.60 - 10.00 %  LACTATE DEHYDROGENASE (CC13)     Status: None   Collection Time    10/16/13 11:58 AM      Result Value Range   LDH 133  125 - 245 U/L  COMPREHENSIVE METABOLIC PANEL (QQ76)     Status: Abnormal   Collection Time    10/16/13 11:58 AM      Result Value Range   Sodium 143  136 - 145 mEq/L   Potassium 4.2  3.5 - 5.1 mEq/L   Chloride 110 (*) 98 - 109 mEq/L   CO2 23  22 - 29 mEq/L   Glucose 126  70 - 140 mg/dl   BUN 22.1  7.0 - 26.0 mg/dL   Creatinine 0.8  0.6 - 1.1 mg/dL   Total Bilirubin 0.25  0.20 - 1.20 mg/dL   Alkaline Phosphatase 77  40 - 150 U/L   AST 13  5 - 34 U/L   ALT 15  0 - 55 U/L   Total Protein 6.1 (*) 6.4 - 8.3 g/dL   Albumin 3.5  3.5 - 5.0 g/dL   Calcium 10.3  8.4 - 10.4 mg/dL   Anion Gap 9  3 - 11 mEq/L    Lab Results  Component Value Date   WBC 4.1 10/16/2013   HGB 11.0* 10/16/2013   HCT 33.7* 10/16/2013   MCV 91.8 10/16/2013   PLT 251 10/16/2013   ASSESSMENT & PLAN:  #1 Recurrent iron deficiency anemia, GI bleed until proven otherwise This is likely anemia of from chronic blood loss.  The patient denies recent history of bleeding such as epistaxis, hematuria or hematochezia. She is asymptomatic from the anemia. We will observe for now.  She does not require transfusion now. Her next GI evaluation is pending Repeat iron study showed ferritin >100 #2 recent hypercalcemia, resolved Her  most recent bone marrow aspirate and biopsy  January 2013 show no evidence of malignancy. Additional workup excluded multiple myeloma. In the meantime, she had discontinue calcium supplements. All questions were answered. The patient knows to call the clinic with any problems, questions or concerns. No barriers to learning was detected.  I spent 15 minutes counseling the patient face to face. The total time spent in the appointment was 20 minutes and more than 50% was on counseling.     [place], [place], MD 10/16/2013 1:39 PM

## 2013-10-16 NOTE — Telephone Encounter (Signed)
GV AND PRINTED APPT SCHED ANDAVS FOR PTFOR fEB 2015

## 2013-10-17 ENCOUNTER — Other Ambulatory Visit: Payer: Medicare Other | Admitting: Lab

## 2013-10-17 LAB — DIRECT ANTIGLOBULIN TEST (NOT AT ARMC)
DAT (COMPLEMENT): NEGATIVE
DAT IgG: NEGATIVE

## 2013-10-17 LAB — HAPTOGLOBIN: HAPTOGLOBIN: 159 mg/dL (ref 45–215)

## 2013-10-17 LAB — SEDIMENTATION RATE: Sed Rate: 7 mm/hr (ref 0–22)

## 2013-11-03 ENCOUNTER — Encounter: Payer: Self-pay | Admitting: Internal Medicine

## 2013-11-03 ENCOUNTER — Telehealth: Payer: Self-pay | Admitting: *Deleted

## 2013-11-03 NOTE — Telephone Encounter (Signed)
Pt called to ask if her calcium will be checked on next appt 2/02?   Informed pt that calcium is part of the labs ordered by Dr. Bertis RuddyGorsuch.  Pt states she wants to make sure she remembers to discuss her calcium level w/ Dr. Bertis RuddyGorsuch on next visit.   She wants to know when/if she can resume taking her calcium supplement.   Encouraged pt to write down her questions and concerns on paper and bring to next visit 2/02.  She verbalized understanding.

## 2013-11-08 ENCOUNTER — Encounter: Payer: Self-pay | Admitting: Internal Medicine

## 2013-11-08 ENCOUNTER — Ambulatory Visit (INDEPENDENT_AMBULATORY_CARE_PROVIDER_SITE_OTHER): Payer: Medicare Other | Admitting: Internal Medicine

## 2013-11-08 VITALS — BP 130/70 | HR 80 | Ht 62.0 in | Wt 194.2 lb

## 2013-11-08 DIAGNOSIS — D509 Iron deficiency anemia, unspecified: Secondary | ICD-10-CM

## 2013-11-08 DIAGNOSIS — G2581 Restless legs syndrome: Secondary | ICD-10-CM

## 2013-11-08 NOTE — Progress Notes (Signed)
Patient ID: Melanie [name], female   DOB: May 21, 1935, 79 y.o.   MRN: [id number] HPI: Melanie [name] is a 79 year old female with a past medical history of iron deficiency anemia, hiatal hernia, diverticulosis, arthritis, who is seen to evaluate iron deficiency anemia. She was previously seen by Dr. Sheryn Bisonavid Patterson but is transferring to my care due to his upcoming retirement.  She is here alone today. She reports she is here to discuss repeat endoscopy. Her hematologist feels like her a deficiency is secondary to GI blood losses until proven otherwise. She reports overall she feels well. She is struggling with restless leg and her left leg at night. She reports this to be so violent that she rips her bed sheets. She also reports it has impaired her sleep and lead to increased fatigue in the mornings. She denies change in bowel habit including no diarrhea or constipation. She seen no blood in her stool and denies melena. No nosebleeds, gingival bleeding or easy bruising. No abdominal pain. She reports a good appetite.  She reports [place] by Dr. Jarold MottoPatterson, and most recently by Dr. Elnoria HowardHung in 2012 She has never had a video capsule endoscopy but reports one was recommended but not performed because she couldn't swallow the pill camera  Past Medical History  Diagnosis Date  . Hypertension   . Allergy   . Hiatal hernia   . Anemia   . Cataract   . Diverticula of colon   . Arthritis     right hip is most painful and affects mobility as far as ambulating steps  . Iron deficiency anemia, unspecified 12/25/2012  . Hypercalcemia 08/18/2013    Past Surgical History  Procedure Laterality Date  . Cholecystectomy    . Tonsillectomy    . Cataract extraction Bilateral     2008    Current Outpatient Prescriptions  Medication Sig Dispense Refill  . aspirin 81 MG tablet Take 81 mg by mouth daily.       Marland Kitchen. docusate sodium (COLACE) 100 MG capsule Take 200 mg by mouth daily  before breakfast.       . metoprolol (LOPRESSOR) 50 MG tablet TAKE 1 TABLET (50 MG TOTAL) BY MOUTH 2 (TWO) TIMES DAILY.  60 tablet  5   No current facility-administered medications for this visit.    Allergies  Allergen Reactions  . Cephalexin Other (See Comments)    Affected pt's child -  Therefore , pt does not take Keflex.  . Codeine Nausea And Vomiting  . Levofloxacin Other (See Comments)    Hallucinations.  . Morphine And Related Nausea And Vomiting  . Other     Another antibiotic that starts with cina?  . Quinolones Other (See Comments)    Hallucinations.  . Sulfa Antibiotics Nausea And Vomiting  . Amoxicillin Nausea Only  . Fexofenadine Hcl Other (See Comments)    unknown reaction  . Floxin [Ocuflox] Other (See Comments)    unknown allergy  . Neurontin [Gabapentin] Palpitations  . Septra [Bactrim] Other (See Comments)    Unknown reaction    Family History  Problem Relation Age of Onset  . Asthma Other   . Hypertension Other   . Hyperlipidemia Other   . Stroke Other   . Diabetes Mother   . Heart disease Mother   . Heart disease Father   . Diverticulosis Mother     History  Substance Use Topics  . Smoking status: Never Smoker   . Smokeless tobacco: Never  Used  . Alcohol Use: No    ROS: As per history of present illness, otherwise negative  BP 130/70  Pulse 80  Ht 5\' 2"  (1.575 m)  Wt 194 lb 4 oz (88.111 kg)  BMI 35.52 kg/m2 Constitutional: Well-developed and well-nourished. No distress. HEENT: Normocephalic and atraumatic. Oropharynx is clear and moist. No oropharyngeal exudate. Conjunctivae are normal.  No scleral icterus. Neck: Neck supple. Trachea midline. Cardiovascular: Normal rate, regular rhythm and intact distal pulses.  Pulmonary/chest: Effort normal and breath sounds normal. No wheezing, rales or rhonchi. Abdominal: Soft, nontender, nondistended. Bowel sounds active throughout.  Extremities: no clubbing, cyanosis, or edema Neurological:  Alert and oriented to person place and time. Skin: Skin is warm and dry. No rashes noted. Psychiatric: Normal mood and affect. Behavior is normal.  RELEVANT LABS AND IMAGING: CBC    Component Value Date/Time   WBC 4.1 10/16/2013 1158   WBC 5.3 11/12/2011 0745   RBC 3.67* 10/16/2013 1158   RBC 4.52 11/12/2011 0745   RBC 3.03* 08/22/2011 2158   HGB 11.0* 10/16/2013 1158   HGB 10.1* 11/12/2011 0745   HCT 33.7* 10/16/2013 1158   HCT 33.1* 11/12/2011 0745   PLT 251 10/16/2013 1158   PLT 350 11/12/2011 0745   MCV 91.8 10/16/2013 1158   MCV 73.2* 11/12/2011 0745   MCH 30.0 10/16/2013 1158   MCH 22.3* 11/12/2011 0745   MCHC 32.6 10/16/2013 1158   MCHC 30.5 11/12/2011 0745   RDW 14.4 10/16/2013 1158   RDW 20.4* 11/12/2011 0745   LYMPHSABS 0.8* 10/16/2013 1158   LYMPHSABS 0.9 08/22/2011 1205   MONOABS 0.3 10/16/2013 1158   MONOABS 0.5 08/22/2011 1205   EOSABS 0.2 10/16/2013 1158   EOSABS 0.1 08/22/2011 1205   BASOSABS 0.0 10/16/2013 1158   BASOSABS 0.1 08/22/2011 1205    CMP     Component Value Date/Time   NA 143 10/16/2013 1158   NA 139 03/18/2012 1031   K 4.2 10/16/2013 1158   K 4.3 03/18/2012 1031   CL 109* 12/23/2012 1100   CL 105 03/18/2012 1031   CO2 23 10/16/2013 1158   CO2 26 03/18/2012 1031   GLUCOSE 126 10/16/2013 1158   GLUCOSE 106* 12/23/2012 1100   GLUCOSE 172* 03/18/2012 1031   BUN 22.1 10/16/2013 1158   BUN 16 03/18/2012 1031   CREATININE 0.8 10/16/2013 1158   CREATININE 0.84 03/18/2012 1031   CALCIUM 10.3 10/16/2013 1158   CALCIUM 10.3 03/18/2012 1031   PROT 6.1* 10/16/2013 1158   PROT 6.9 11/06/2011 1122   ALBUMIN 3.5 10/16/2013 1158   ALBUMIN 4.5 11/06/2011 1122   AST 13 10/16/2013 1158   AST 23 11/06/2011 1122   ALT 15 10/16/2013 1158   ALT 18 11/06/2011 1122   ALKPHOS 77 10/16/2013 1158   ALKPHOS 88 11/06/2011 1122   BILITOT 0.25 10/16/2013 1158   BILITOT 0.3 11/06/2011 1122   GFRNONAA 80* 08/22/2011 1205   GFRAA >90 08/22/2011 1205   Iron/TIBC/Ferritin    Component Value Date/Time   IRON 75 10/16/2013 1158   IRON  31* 12/23/2012 1101   TIBC 265 10/16/2013 1158   TIBC 393 12/23/2012 1101   FERRITIN 110 10/16/2013 1158   FERRITIN 4* 03/13/2013 1013   EGD, 09/15/2011, Dr. Jeani Hawking -- 5 cm hiatus hernia without Cameron's lesions. Otherwise normal. Small bowel biopsies = normal no evidence for celiac disease Colonoscopy 10/01/2011 -- exam to the cecum, diverticulosis in the left colon. A 3 mm discrete erythematous patch in the  ascending colon which he did not feel was an AVM. Not bleeding and no other ulcers, erosions, polyps or masses. Medium size hemorrhoids  ASSESSMENT/PLAN: 79 year old female with a past medical history of iron deficiency anemia, hiatal hernia, diverticulosis, arthritis, who is seen to evaluate iron deficiency anemia.  1.  Persistent IDA -- she has required multiple IV iron infusions over the last several years and has had a persistent anemia. There is question of a right colon angiodysplasia which has not been treated, and also her small bowel has never been completely imaged. She has a known hiatal hernia which puts her at risk for Cameron's lesions but none have ever been seen. We have discussed at length today repeating endoscopies, including the risks and benefits and she is agreeable to proceed. We will plan repeat EGD and colonoscopy with APC of any visualized angiodysplastic lesions. Due to her inability to swallow the pill camera, I will endoscopically place in the small bowel for further evaluation of small bowel angiodysplastic lesions.    2.  RLS -- neuro referral for evaluation  She will continue follow-up with her hematologist

## 2013-11-08 NOTE — Patient Instructions (Addendum)
You have been scheduled for a colonoscopy/Endoscopy with Video capsule placement with propofol. Please follow written instructions given to you at your visit today.  Please pick up your prep kit at the pharmacy within the next 1-3 days. If you use inhalers (even only as needed), please bring them with you on the day of your procedure. Your physician has requested that you go to www.startemmi.com and enter the access code given to you at your visit today. This web site gives a general overview about your procedure. However, you should still follow specific instructions given to you by our office regarding your preparation for the procedure.  We have sent the following medications to your pharmacy for you to pick up at your convenience: Moviprep    You have been referred to Neurology. You will be contacted by them to make that appointment.

## 2013-11-13 ENCOUNTER — Ambulatory Visit (HOSPITAL_BASED_OUTPATIENT_CLINIC_OR_DEPARTMENT_OTHER): Payer: Medicare Other | Admitting: Hematology and Oncology

## 2013-11-13 ENCOUNTER — Other Ambulatory Visit (HOSPITAL_BASED_OUTPATIENT_CLINIC_OR_DEPARTMENT_OTHER): Payer: Medicare Other

## 2013-11-13 ENCOUNTER — Ambulatory Visit (HOSPITAL_BASED_OUTPATIENT_CLINIC_OR_DEPARTMENT_OTHER): Payer: Medicare Other

## 2013-11-13 ENCOUNTER — Encounter: Payer: Self-pay | Admitting: Hematology and Oncology

## 2013-11-13 ENCOUNTER — Telehealth: Payer: Self-pay | Admitting: Hematology and Oncology

## 2013-11-13 VITALS — BP 138/70 | HR 77 | Temp 98.1°F | Resp 18 | Ht 62.0 in | Wt 196.3 lb

## 2013-11-13 DIAGNOSIS — D649 Anemia, unspecified: Secondary | ICD-10-CM

## 2013-11-13 DIAGNOSIS — K922 Gastrointestinal hemorrhage, unspecified: Secondary | ICD-10-CM

## 2013-11-13 DIAGNOSIS — D509 Iron deficiency anemia, unspecified: Secondary | ICD-10-CM

## 2013-11-13 LAB — CBC & DIFF AND RETIC
BASO%: 0.6 % (ref 0.0–2.0)
Basophils Absolute: 0 10*3/uL (ref 0.0–0.1)
EOS%: 4.2 % (ref 0.0–7.0)
Eosinophils Absolute: 0.2 10*3/uL (ref 0.0–0.5)
HCT: 36.2 % (ref 34.8–46.6)
HGB: 11.6 g/dL (ref 11.6–15.9)
IMMATURE RETIC FRACT: 10.6 % — AB (ref 1.60–10.00)
LYMPH#: 1 10*3/uL (ref 0.9–3.3)
LYMPH%: 19.7 % (ref 14.0–49.7)
MCH: 29.1 pg (ref 25.1–34.0)
MCHC: 32 g/dL (ref 31.5–36.0)
MCV: 90.7 fL (ref 79.5–101.0)
MONO#: 0.4 10*3/uL (ref 0.1–0.9)
MONO%: 8.1 % (ref 0.0–14.0)
NEUT#: 3.5 10*3/uL (ref 1.5–6.5)
NEUT%: 67.4 % (ref 38.4–76.8)
Platelets: 344 10*3/uL (ref 145–400)
RBC: 3.99 10*6/uL (ref 3.70–5.45)
RDW: 13.4 % (ref 11.2–14.5)
RETIC %: 2.37 % — AB (ref 0.70–2.10)
Retic Ct Abs: 94.56 10*3/uL — ABNORMAL HIGH (ref 33.70–90.70)
WBC: 5.2 10*3/uL (ref 3.9–10.3)

## 2013-11-13 LAB — COMPREHENSIVE METABOLIC PANEL (CC13)
ALT: 15 U/L (ref 0–55)
ANION GAP: 9 meq/L (ref 3–11)
AST: 15 U/L (ref 5–34)
Albumin: 3.6 g/dL (ref 3.5–5.0)
Alkaline Phosphatase: 86 U/L (ref 40–150)
BILIRUBIN TOTAL: 0.22 mg/dL (ref 0.20–1.20)
BUN: 13.7 mg/dL (ref 7.0–26.0)
CO2: 25 meq/L (ref 22–29)
CREATININE: 0.8 mg/dL (ref 0.6–1.1)
Calcium: 10.5 mg/dL — ABNORMAL HIGH (ref 8.4–10.4)
Chloride: 109 mEq/L (ref 98–109)
Glucose: 125 mg/dl (ref 70–140)
Potassium: 4.3 mEq/L (ref 3.5–5.1)
Sodium: 143 mEq/L (ref 136–145)
Total Protein: 6.4 g/dL (ref 6.4–8.3)

## 2013-11-13 LAB — FERRITIN CHCC: Ferritin: 21 ng/ml (ref 9–269)

## 2013-11-13 MED ORDER — DIPHENHYDRAMINE HCL 25 MG PO CAPS
25.0000 mg | ORAL_CAPSULE | Freq: Once | ORAL | Status: AC
  Administered 2013-11-13: 25 mg via ORAL

## 2013-11-13 MED ORDER — ACETAMINOPHEN 325 MG PO TABS
650.0000 mg | ORAL_TABLET | Freq: Once | ORAL | Status: AC
  Administered 2013-11-13: 650 mg via ORAL

## 2013-11-13 MED ORDER — DIPHENHYDRAMINE HCL 25 MG PO CAPS
ORAL_CAPSULE | ORAL | Status: AC
  Filled 2013-11-13: qty 1

## 2013-11-13 MED ORDER — SODIUM CHLORIDE 0.9 % IV SOLN
1020.0000 mg | Freq: Once | INTRAVENOUS | Status: AC
  Administered 2013-11-13: 1020 mg via INTRAVENOUS
  Filled 2013-11-13: qty 34

## 2013-11-13 MED ORDER — SODIUM CHLORIDE 0.9 % IV SOLN
Freq: Once | INTRAVENOUS | Status: AC
  Administered 2013-11-13: 12:00:00 via INTRAVENOUS

## 2013-11-13 MED ORDER — ACETAMINOPHEN 325 MG PO TABS
ORAL_TABLET | ORAL | Status: AC
  Filled 2013-11-13: qty 2

## 2013-11-13 NOTE — Progress Notes (Signed)
[facility] OFFICE PROGRESS NOTE  [name],[name], MD DIAGNOSIS:  Persistent iron deficiency anemia, chronic GI bleed until proven otherwise  SUMMARY OF HEMATOLOGIC HISTORY: The patient has been complaining of fatigue and was found to have severe iron deficiency anemia. Approximately a year or 2 ago, she received 2 units of blood transfusion because of this. She had extensive GI evaluation with EGD and colonoscopy and was never found to have any sort of GI bleed. She has received 4 intravenous doses of iron infusion and the last infusion in 3 months ago. Bone marrow aspirate and biopsy in January 2013 showed no evidence of malignancy or persistent absence of iron stores. In 2014, she received 6 doses of intravenous iron infusion. She was referred to gastroenterology for further evaluation due to suspected GI bleed INTERVAL HISTORY: Melanie [name] 79 y.o. female returns for further followup. She complained of fatigue. Denies any dizziness, chest pain or shortness of breath. The patient denies any recent signs or symptoms of bleeding such as spontaneous epistaxis, hematuria or hematochezia.  I have reviewed the past medical history, past surgical history, social history and family history with the patient and they are unchanged from previous note.  ALLERGIES:  is allergic to cephalexin; codeine; levofloxacin; morphine and related; other; quinolones; sulfa antibiotics; amoxicillin; fexofenadine hcl; floxin; neurontin; and septra.  MEDICATIONS:  Current Outpatient Prescriptions  Medication Sig Dispense Refill  . aspirin 81 MG tablet Take 81 mg by mouth daily.       Marland Kitchen docusate sodium (COLACE) 100 MG capsule Take 200 mg by mouth daily before breakfast.       . metoprolol (LOPRESSOR) 50 MG tablet TAKE 1 TABLET (50 MG TOTAL) BY MOUTH 2 (TWO) TIMES DAILY.  60 tablet  5   No current facility-administered medications for this visit.   Facility-Administered Medications Ordered in  Other Visits  Medication Dose Route Frequency Provider Last Rate Last Dose  . ferumoxytol (FERAHEME) 1,020 mg in sodium chloride 0.9 % 100 mL IVPB  1,020 mg Intravenous Once Heath Lark, MD   1,020 mg at 11/13/13 1238     REVIEW OF SYSTEMS:   Constitutional: Denies fevers, chills or night sweats Eyes: Denies blurriness of vision Ears, nose, mouth, throat, and face: Denies mucositis or sore throat Respiratory: Denies cough, dyspnea or wheezes Cardiovascular: Denies palpitation, chest discomfort or lower extremity swelling Gastrointestinal:  Denies nausea, heartburn or change in bowel habits Skin: Denies abnormal skin rashes Lymphatics: Denies new lymphadenopathy or easy bruising Neurological:Denies numbness, tingling or new weaknesses Behavioral/Psych: Mood is stable, no new changes  All other systems were reviewed with the patient and are negative.  PHYSICAL EXAMINATION: ECOG PERFORMANCE STATUS: 1 - Symptomatic but completely ambulatory  Filed Vitals:   11/13/13 1045  BP: 138/70  Pulse: 77  Temp: 98.1 F (36.7 C)  Resp: 18   Filed Weights   11/13/13 1045  Weight: 196 lb 4.8 oz (89.041 kg)    GENERAL:alert, no distress and comfortable SKIN: skin color, texture, turgor are normal, no rashes or significant lesions EYES: normal, Conjunctiva are pink and non-injected, sclera clear OROPHARYNX:no exudate, no erythema and lips, buccal mucosa, and tongue normal  NECK: supple, thyroid normal size, non-tender, without nodularity LYMPH:  no palpable lymphadenopathy in the cervical, axillary or inguinal LUNGS: clear to auscultation and percussion with normal breathing effort HEART: regular rate & rhythm and no murmurs and no lower extremity edema ABDOMEN:abdomen soft, non-tender and normal bowel sounds Musculoskeletal:no cyanosis of digits and no  clubbing  NEURO: alert & oriented x 3 with fluent speech, no focal motor/sensory deficits  LABORATORY DATA:  I have reviewed the data as  listed Results for orders placed in visit on 11/13/13 (from the past 48 hour(s))  CBC & DIFF AND RETIC     Status: Abnormal   Collection Time    11/13/13 10:30 AM      Result Value Range   WBC 5.2  3.9 - 10.3 10e3/uL   NEUT# 3.5  1.5 - 6.5 10e3/uL   HGB 11.6  11.6 - 15.9 g/dL   HCT 36.2  34.8 - 46.6 %   Platelets 344  145 - 400 10e3/uL   MCV 90.7  79.5 - 101.0 fL   MCH 29.1  25.1 - 34.0 pg   MCHC 32.0  31.5 - 36.0 g/dL   RBC 3.99  3.70 - 5.45 10e6/uL   RDW 13.4  11.2 - 14.5 %   lymph# 1.0  0.9 - 3.3 10e3/uL   MONO# 0.4  0.1 - 0.9 10e3/uL   Eosinophils Absolute 0.2  0.0 - 0.5 10e3/uL   Basophils Absolute 0.0  0.0 - 0.1 10e3/uL   NEUT% 67.4  38.4 - 76.8 %   LYMPH% 19.7  14.0 - 49.7 %   MONO% 8.1  0.0 - 14.0 %   EOS% 4.2  0.0 - 7.0 %   BASO% 0.6  0.0 - 2.0 %   Retic % 2.37 (*) 0.70 - 2.10 %   Retic Ct Abs 94.56 (*) 33.70 - 90.70 10e3/uL   Immature Retic Fract 10.60 (*) 1.60 - 10.00 %  FERRITIN [facility]     Status: None   Collection Time    11/13/13 10:30 AM      Result Value Range   Ferritin 21  9 - 269 ng/ml  COMPREHENSIVE METABOLIC PANEL (UT65)     Status: Abnormal   Collection Time    11/13/13 10:31 AM      Result Value Range   Sodium 143  136 - 145 mEq/L   Potassium 4.3  3.5 - 5.1 mEq/L   Chloride 109  98 - 109 mEq/L   CO2 25  22 - 29 mEq/L   Glucose 125  70 - 140 mg/dl   BUN 13.7  7.0 - 26.0 mg/dL   Creatinine 0.8  0.6 - 1.1 mg/dL   Total Bilirubin 0.22  0.20 - 1.20 mg/dL   Alkaline Phosphatase 86  40 - 150 U/L   AST 15  5 - 34 U/L   ALT 15  0 - 55 U/L   Total Protein 6.4  6.4 - 8.3 g/dL   Albumin 3.6  3.5 - 5.0 g/dL   Calcium 10.5 (*) 8.4 - 10.4 mg/dL   Anion Gap 9  3 - 11 mEq/L    Lab Results  Component Value Date   WBC 5.2 11/13/2013   HGB 11.6 11/13/2013   HCT 36.2 11/13/2013   MCV 90.7 11/13/2013   PLT 344 11/13/2013    ASSESSMENT & PLAN:  #1 Recurrent iron deficiency anemia, GI bleed until proven otherwise This is likely anemia of from chronic blood loss.   The patient denies recent history of bleeding such as epistaxis, hematuria or hematochezia. She is asymptomatic from the anemia. We will observe for now.  She does not require transfusion now. Her next GI evaluation is pending Repeat iron study showed ferritin down to 21. I will proceed with further iron infusion today.  We discussed some of the  risks, benefits, and alternatives of intravenous iron infusions. The patient is symptomatic from anemia and the iron level is critically low. She tolerated oral iron supplement poorly and desires to achieved higher levels of iron faster for adequate hematopoesis. Some of the side-effects to be expected including risks of infusion reactions, phlebitis, headaches, nausea and fatigue.  The patient is willing to proceed. Patient education material was dispensed.  Goal is to keep ferritin level greater than 50  #2 recent hypercalcemia, resolved Her most recent bone marrow aspirate and biopsy January 2013 show no evidence of malignancy. Additional workup excluded multiple myeloma. In the meantime, she had discontinue calcium supplements. All questions were answered. The patient knows to call the clinic with any problems, questions or concerns. No barriers to learning was detected.  I spent 25 minutes counseling the patient face to face. The total time spent in the appointment was 40 minutes and more than 50% was on counseling.     [facility], [place], MD 11/13/2013 12:47 PM

## 2013-11-13 NOTE — Telephone Encounter (Signed)
gv adn printed appts sched and avs for pt for May °

## 2013-11-13 NOTE — Patient Instructions (Signed)

## 2013-11-17 ENCOUNTER — Telehealth: Payer: Self-pay | Admitting: Internal Medicine

## 2013-11-17 ENCOUNTER — Telehealth: Payer: Self-pay | Admitting: Gastroenterology

## 2013-11-17 DIAGNOSIS — G2581 Restless legs syndrome: Secondary | ICD-10-CM

## 2013-11-17 MED ORDER — MOVIPREP 100 G PO SOLR: ORAL | Status: DC

## 2013-11-17 NOTE — Telephone Encounter (Signed)
Called neurology to schedule the appointment. Was told by Annabelle Harmanana she will talk to Dr. Arbutus Leasat (even though I didn't say what Dr. I was referring pt to) and they call the pt to schedule that appointment.

## 2013-11-17 NOTE — Telephone Encounter (Signed)
Sent prep into pt's pharmacy. Put in another referral to Neurology.

## 2013-11-23 ENCOUNTER — Telehealth: Payer: Self-pay | Admitting: Neurology

## 2013-11-23 ENCOUNTER — Telehealth: Payer: Self-pay | Admitting: Gastroenterology

## 2013-11-23 ENCOUNTER — Telehealth: Payer: Self-pay | Admitting: Internal Medicine

## 2013-11-23 NOTE — Telephone Encounter (Signed)
Pt called and would like to speak to a nurse regarding her meds.

## 2013-11-23 NOTE — Telephone Encounter (Signed)
Spoke with Patient. Told her per Neurology, they have been trying to get in touch with the patient. Pt states she has had no call from neurology and no message on her machine. I gave pt neurology's phone # to call and schedule her appointment based on when her ride can take her. Pt said she will call them today.   THANK YOU FOR YOUR HELP ----- Message -----   From: Richardo HanksStacy A Holdan Stucke, CMA Sent: 11/23/2013 11:58 AM To: Randa Lynnana T Smith I'll try calling her. I let you know if I get in touch with her. ----- Message -----   From: Randa Lynnana T Smith Sent: 11/23/2013 11:03 AM To: Cammie McgeeStacy A Oney Folz, CMA HEY Landa Mullinax I HAVE CALLED THE PATIENT EVERYDAY AND NO ONE HAS ANSWERED AND I HAVE NOT BEEN ABLE TO LEAVE A MESSAGE. UNABLE TO CONTACT THE PATIENT WE WILL BE HAPPY TO SEE THE PATIENT IF THE PATIENT CONTACTS US THANK YOU FOR THE REFERRAL DANA

## 2013-11-23 NOTE — Telephone Encounter (Signed)
Attempted to contact pt x 2.  Busy first time then no answer a few minutes later.  Will try again.

## 2013-11-24 NOTE — Telephone Encounter (Signed)
Spoke with Dr. Arbutus Leasat and she said that she has not seen pt yet.  Instructed me to disregard this message.

## 2013-11-27 NOTE — Telephone Encounter (Signed)
Spoke to pt. She was concerned about her stool softener. It has a red and white coating on it. I told her it will be fine for her to take. The directions for her colonoscopy state she should not drink anything colored red or purple. Pt verbalized understanding.

## 2013-11-28 ENCOUNTER — Ambulatory Visit: Payer: Medicare Other | Admitting: Neurology

## 2013-11-30 ENCOUNTER — Telehealth: Payer: Self-pay

## 2013-11-30 ENCOUNTER — Encounter (HOSPITAL_COMMUNITY): Payer: Self-pay | Admitting: *Deleted

## 2013-11-30 NOTE — Progress Notes (Signed)
Aram BeechamCynthia - Mrs. Amarello -has concern that a Ferritin level be done several days of her procedure on 12-26-13, states H/H do not give accurate level of her anemia.-FYI . W. Kennon PortelaHendrick,RN

## 2013-11-30 NOTE — Telephone Encounter (Signed)
Message    Pt's labs should be ordered and followed by hematology whom she is actively seeing   JMP      ----- Message -----   From: Patrici RanksWilhemina S Hendrick, RN   Sent: 11/30/2013 1:01 PM   To: Beverley FiedlerJay M Pyrtle, MD   Subject: Inpatient Notes       Aram Beechamynthia please note.               Attached Notes     Author: Patrici RanksWilhemina S Hendrick, RN Service: (none) Author Type: Registered Nurse    Filed: 11/30/2013 12:59 PM Note Time: 11/30/2013 12:55 PM Note Type: Progress Notes    Status: Signed Editor: Patrici RanksWilhemina S Hendrick, RN (Registered Nurse)    Aram Beechamynthia - Mrs. Amarello -has concern that a Ferritin level be done several days of her procedure on 12-26-13, states H/H do not give accurate level of her anemia.-FYI . Saunders GlanceW. Hendrick,RN                       FromBeverley Fiedler: PYRTLE, JAY M        Sent: Thursday November 30, 2013        Pt's labs should be ordered and followed by hematology whom she is actively seeing JMP  ----- Message ----- From: Patrici RanksWilhemina S Hendrick, RN Sent: 11/30/2013 1:01 PM To: Beverley FiedlerJay M Pyrtle, MD Subject: Inpatient Notes   Aram Beechamynthia please note.                         Pt's labs should be ordered and followed by hematology whom she is actively seeing    JMP    ----- Message -----    From: Beverley FiedlerPYRTLE, JAY M    Sent: 11/30/2013 1:01 PM    To: Beverley FiedlerJay M Pyrtle, MD    Subject: Inpatient Notes     Cynthia please note.   Patient notified of Dr. Lauro FranklinPyrtle's recommendations

## 2013-12-07 ENCOUNTER — Ambulatory Visit: Payer: Medicare Other | Admitting: Family Medicine

## 2013-12-11 ENCOUNTER — Telehealth: Payer: Self-pay | Admitting: Gastroenterology

## 2013-12-11 ENCOUNTER — Other Ambulatory Visit: Payer: Self-pay | Admitting: Gastroenterology

## 2013-12-11 ENCOUNTER — Telehealth: Payer: Self-pay | Admitting: Internal Medicine

## 2013-12-11 MED ORDER — SOD PICOSULFATE-MAG OX-CIT ACD 10-3.5-12 MG-GM-GM PO PACK: 1.0000 | PACK | Freq: Once | ORAL | Status: DC

## 2013-12-11 NOTE — Telephone Encounter (Signed)
Patient notified She is aware that we will mail her out the new instructions

## 2013-12-11 NOTE — Telephone Encounter (Signed)
Left message for patient to call back  

## 2013-12-11 NOTE — Telephone Encounter (Signed)
Mailed pt new colon prep instructions for Prepopik because pt states she is allergic to aspartame an ingredient in moviprep.

## 2013-12-11 NOTE — Telephone Encounter (Signed)
Patient has many questions.  She believes she is to have a echocardiogram on the day of her procedure. Do you want to order an echo?  What is an alternate prep that does not contain aspertame?  She states that she can't take aspertame are you ok with Prepopik?

## 2013-12-11 NOTE — Telephone Encounter (Signed)
I am okay with Prep-o-Pik  Would defer to PCP on need for ECHO, I do not feel it is necessary pre-procedure

## 2013-12-14 ENCOUNTER — Ambulatory Visit (INDEPENDENT_AMBULATORY_CARE_PROVIDER_SITE_OTHER): Payer: Medicare Other | Admitting: Family Medicine

## 2013-12-14 ENCOUNTER — Encounter: Payer: Self-pay | Admitting: Family Medicine

## 2013-12-14 VITALS — BP 100/60 | HR 64 | Temp 98.6°F | Resp 18 | Ht 62.0 in | Wt 200.0 lb

## 2013-12-14 DIAGNOSIS — J069 Acute upper respiratory infection, unspecified: Secondary | ICD-10-CM

## 2013-12-14 NOTE — Progress Notes (Signed)
Subjective:    Patient ID: Melanie [name], female    DOB: Jan 21, 1935, 79 y.o.   MRN: [id number]  HPI Patient complains of wheezing primarily occurs at night, postnasal drip, sore and scratchy throat. She denies any fever. She denies any coughing. She does report some mild chest congestion. This has been ongoing for the last 7-10 days. She is scheduled for an upper endoscopy on the 17th and she wanted to be evaluated prior to that. She denies any fever. She denies any shortness of breath. She denies any chest pain. She denies pleurisy. Past Medical History  Diagnosis Date  . Hypertension   . Allergy   . Hiatal hernia   . Anemia   . Cataract   . Diverticula of colon   . Arthritis     right hip is most painful and affects mobility as far as ambulating steps  . Iron deficiency anemia, unspecified 12/25/2012  . Hypercalcemia 08/18/2013  . Complication of anesthesia     after gallbladder surgery, had memory issues 2 days after for several days  . Hearing loss    Current Outpatient Prescriptions on File Prior to Visit  Medication Sig Dispense Refill  . aspirin 81 MG tablet Take 81 mg by mouth daily.       Marland Kitchen docusate sodium (COLACE) 100 MG capsule Take 200 mg by mouth daily before breakfast.       . metoprolol (LOPRESSOR) 50 MG tablet TAKE 1 TABLET (50 MG TOTAL) BY MOUTH 2 (TWO) TIMES DAILY.  60 tablet  5  . MOVIPREP 100 G SOLR Use per prep instruction  1 kit  0  . Sod Picosulfate-Mag Ox-Cit Acd 10-3.5-12 MG-GM-GM PACK Take 1 kit by mouth once.  1 each  0   No current facility-administered medications on file prior to visit.   Allergies  Allergen Reactions  . Cephalexin Other (See Comments)    Affected pt's child -  Therefore , pt does not take Keflex.  . Codeine Nausea And Vomiting  . Levofloxacin Other (See Comments)    Hallucinations.  . Morphine And Related Nausea And Vomiting  . Other     Another antibiotic that starts with cina?  . Quinolones Other (See Comments)   Hallucinations.  . Sulfa Antibiotics Nausea And Vomiting  . Amoxicillin Nausea Only  . Fexofenadine Hcl Other (See Comments)    unknown reaction  . Floxin [Ocuflox] Other (See Comments)    unknown allergy  . Neurontin [Gabapentin] Palpitations  . Septra [Bactrim] Other (See Comments)    Unknown reaction   History   Social History  . Marital Status: Married    Spouse Name: N/A    Number of Children: 3  . Years of Education: N/A   Occupational History  . Retired    Social History Main Topics  . Smoking status: Never Smoker   . Smokeless tobacco: Never Used  . Alcohol Use: No  . Drug Use: No  . Sexual Activity: Not Currently   Other Topics Concern  . Not on file   Social History Narrative  . No narrative on file      Review of Systems  All other systems reviewed and are negative.       Objective:   Physical Exam  Vitals reviewed. Constitutional: She appears well-developed and well-nourished. No distress.  HENT:  Right Ear: Tympanic membrane, external ear and ear canal normal.  Left Ear: Tympanic membrane, external ear and ear canal normal.  Nose: Nose normal. No  mucosal edema or rhinorrhea. Right sinus exhibits no maxillary sinus tenderness and no frontal sinus tenderness. Left sinus exhibits no maxillary sinus tenderness and no frontal sinus tenderness.  Mouth/Throat: Oropharynx is clear and moist and mucous membranes are normal. No oropharyngeal exudate.  Eyes: Conjunctivae are normal.  Neck: Neck supple.  Cardiovascular: Normal rate, regular rhythm and normal heart sounds.   No murmur heard. Pulmonary/Chest: Effort normal and breath sounds normal. No respiratory distress. She has no wheezes. She has no rales.  Lymphadenopathy:    She has no cervical adenopathy.  Skin: She is not diaphoretic.          Assessment & Plan:  1. URI, acute At the present time I believe the patient has mild postnasal drip related to an upper respiratory tract infection.  This appears to be a mild scar. I recommended that she begin using Mucinex as needed for chest congestion. Do not appreciate any wheezing today on examination. I do not see any indication for antibiotics. Her examination today is very benign. Therefore I believe it is safe and patient proceed with her endoscopy on the 17th. She is to call back if her symptoms worsen. I do not see any indication to start the patient on any type of asthma medication is there is no wheezing today on examination and she is not short of breath.

## 2013-12-19 ENCOUNTER — Telehealth: Payer: Self-pay | Admitting: Internal Medicine

## 2013-12-20 NOTE — Telephone Encounter (Signed)
Pt called to let me know she will be taking the movieprep her insurance will not cover prepopik. She said if the aspertame makes her sick then she will call us.

## 2013-12-21 ENCOUNTER — Encounter (HOSPITAL_COMMUNITY): Payer: Self-pay | Admitting: Pharmacy Technician

## 2013-12-26 ENCOUNTER — Ambulatory Visit (HOSPITAL_COMMUNITY)
Admission: RE | Admit: 2013-12-26 | Discharge: 2013-12-26 | Disposition: A | Discharge status: Home / Self Care | Payer: Medicare Other | Source: Ambulatory Visit | Attending: Internal Medicine | Admitting: Internal Medicine

## 2013-12-26 ENCOUNTER — Encounter (HOSPITAL_COMMUNITY): Payer: Self-pay | Admitting: Registered Nurse

## 2013-12-26 ENCOUNTER — Encounter (HOSPITAL_COMMUNITY)
Admission: RE | Disposition: A | Discharge status: Home / Self Care | Payer: Self-pay | Source: Ambulatory Visit | Attending: Internal Medicine

## 2013-12-26 ENCOUNTER — Encounter (HOSPITAL_COMMUNITY): Payer: Self-pay | Admitting: *Deleted

## 2013-12-26 ENCOUNTER — Encounter (HOSPITAL_COMMUNITY): Payer: Self-pay

## 2013-12-26 ENCOUNTER — Ambulatory Visit (HOSPITAL_COMMUNITY): Payer: Medicare Other | Admitting: Registered Nurse

## 2013-12-26 ENCOUNTER — Other Ambulatory Visit: Payer: Self-pay | Admitting: Internal Medicine

## 2013-12-26 ENCOUNTER — Ambulatory Visit (HOSPITAL_COMMUNITY): Admit: 2013-12-26 | Payer: Self-pay | Admitting: Internal Medicine

## 2013-12-26 ENCOUNTER — Encounter (HOSPITAL_COMMUNITY): Payer: Medicare Other | Admitting: Registered Nurse

## 2013-12-26 DIAGNOSIS — K5730 Diverticulosis of large intestine without perforation or abscess without bleeding: Secondary | ICD-10-CM | POA: Insufficient documentation

## 2013-12-26 DIAGNOSIS — K269 Duodenal ulcer, unspecified as acute or chronic, without hemorrhage or perforation: Secondary | ICD-10-CM

## 2013-12-26 DIAGNOSIS — Z9089 Acquired absence of other organs: Secondary | ICD-10-CM | POA: Insufficient documentation

## 2013-12-26 DIAGNOSIS — K5520 Angiodysplasia of colon without hemorrhage: Secondary | ICD-10-CM

## 2013-12-26 DIAGNOSIS — I10 Essential (primary) hypertension: Secondary | ICD-10-CM | POA: Insufficient documentation

## 2013-12-26 DIAGNOSIS — D509 Iron deficiency anemia, unspecified: Secondary | ICD-10-CM

## 2013-12-26 HISTORY — PX: ESOPHAGOGASTRODUODENOSCOPY: SHX5428

## 2013-12-26 HISTORY — PX: HOT HEMOSTASIS: SHX5433

## 2013-12-26 HISTORY — PX: COLONOSCOPY: SHX5424

## 2013-12-26 HISTORY — DX: Other complications of anesthesia, initial encounter: T88.59XA

## 2013-12-26 HISTORY — DX: Adverse effect of unspecified anesthetic, initial encounter: T41.45XA

## 2013-12-26 HISTORY — PX: GIVENS CAPSULE STUDY: SHX5432

## 2013-12-26 HISTORY — DX: Unspecified hearing loss, unspecified ear: H91.90

## 2013-12-26 SURGERY — EGD (ESOPHAGOGASTRODUODENOSCOPY)
Anesthesia: Monitor Anesthesia Care

## 2013-12-26 MED ORDER — PANTOPRAZOLE SODIUM 40 MG PO TBEC: 40.0000 mg | DELAYED_RELEASE_TABLET | Freq: Every day | ORAL | Status: DC

## 2013-12-26 MED ORDER — SODIUM CHLORIDE 0.9 % IV SOLN: INTRAVENOUS | Status: DC

## 2013-12-26 MED ORDER — MIDAZOLAM HCL 2 MG/2ML IJ SOLN
INTRAMUSCULAR | Status: AC
  Filled 2013-12-26: qty 2

## 2013-12-26 MED ORDER — GLYCOPYRROLATE 0.2 MG/ML IJ SOLN
INTRAMUSCULAR | Status: DC | PRN
  Administered 2013-12-26: 0.2 mg via INTRAVENOUS

## 2013-12-26 MED ORDER — MIDAZOLAM HCL 5 MG/5ML IJ SOLN
INTRAMUSCULAR | Status: DC | PRN
  Administered 2013-12-26: 2 mg via INTRAVENOUS

## 2013-12-26 MED ORDER — PROPOFOL 10 MG/ML IV BOLUS
INTRAVENOUS | Status: AC
  Filled 2013-12-26: qty 20

## 2013-12-26 MED ORDER — LACTATED RINGERS IV SOLN
INTRAVENOUS | Status: DC | PRN
  Administered 2013-12-26: 09:00:00 via INTRAVENOUS

## 2013-12-26 MED ORDER — FENTANYL CITRATE 0.05 MG/ML IJ SOLN: 25.0000 ug | INTRAMUSCULAR | Status: DC | PRN

## 2013-12-26 MED ORDER — FENTANYL CITRATE 0.05 MG/ML IJ SOLN
INTRAMUSCULAR | Status: AC
  Filled 2013-12-26: qty 2

## 2013-12-26 MED ORDER — EPINEPHRINE HCL 0.1 MG/ML IJ SOSY
PREFILLED_SYRINGE | INTRAMUSCULAR | Status: AC
  Filled 2013-12-26: qty 10

## 2013-12-26 MED ORDER — PROPOFOL INFUSION 10 MG/ML OPTIME
INTRAVENOUS | Status: DC | PRN
  Administered 2013-12-26: 200 ug/kg/min via INTRAVENOUS

## 2013-12-26 MED ORDER — LIDOCAINE HCL (CARDIAC) 20 MG/ML IV SOLN
INTRAVENOUS | Status: AC
Start: 2013-12-26 — End: 2013-12-26
  Filled 2013-12-26: qty 5

## 2013-12-26 MED ORDER — LACTATED RINGERS IV SOLN: INTRAVENOUS | Status: DC

## 2013-12-26 MED ORDER — LACTATED RINGERS IV SOLN
INTRAVENOUS | Status: DC
  Administered 2013-12-26: 1000 mL via INTRAVENOUS

## 2013-12-26 MED ORDER — ONDANSETRON HCL 4 MG/2ML IJ SOLN
INTRAMUSCULAR | Status: DC | PRN
  Administered 2013-12-26: 4 mg via INTRAVENOUS

## 2013-12-26 MED ORDER — FENTANYL CITRATE 0.05 MG/ML IJ SOLN
INTRAMUSCULAR | Status: DC | PRN
  Administered 2013-12-26: 100 ug via INTRAVENOUS

## 2013-12-26 MED ORDER — GLYCOPYRROLATE 0.2 MG/ML IJ SOLN
INTRAMUSCULAR | Status: AC
  Filled 2013-12-26: qty 1

## 2013-12-26 MED ORDER — LIDOCAINE HCL (CARDIAC) 20 MG/ML IV SOLN
INTRAVENOUS | Status: DC | PRN
  Administered 2013-12-26: 50 mg via INTRAVENOUS

## 2013-12-26 MED ORDER — ONDANSETRON HCL 4 MG/2ML IJ SOLN
INTRAMUSCULAR | Status: AC
  Filled 2013-12-26: qty 2

## 2013-12-26 MED ORDER — PROPOFOL 10 MG/ML IV BOLUS
INTRAVENOUS | Status: DC | PRN
  Administered 2013-12-26: 40 mg via INTRAVENOUS

## 2013-12-26 MED ORDER — SODIUM CHLORIDE 0.9 % IJ SOLN
INTRAMUSCULAR | Status: DC | PRN
  Administered 2013-12-26: 09:00:00

## 2013-12-26 SURGICAL SUPPLY — 1 items: TOWEL COTTON PACK 4EA (MISCELLANEOUS) ×6 IMPLANT

## 2013-12-26 NOTE — Anesthesia Postprocedure Evaluation (Signed)
  Anesthesia Post-op Note  Patient: Melanie [name]  Procedure(s) Performed: Procedure(s) (LRB): ESOPHAGOGASTRODUODENOSCOPY (EGD) (N/A) COLONOSCOPY (N/A) HOT HEMOSTASIS (ARGON PLASMA COAGULATION/BICAP) (N/A) GIVENS CAPSULE STUDY (N/A)  Patient Location: PACU  Anesthesia Type: MAC  Level of Consciousness: awake and alert   Airway and Oxygen Therapy: Patient Spontanous Breathing  Post-op Pain: mild  Post-op Assessment: Post-op Vital signs reviewed, Patient's Cardiovascular Status Stable, Respiratory Function Stable, Patent Airway and No signs of Nausea or vomiting  Last Vitals:  Filed Vitals:   12/26/13 0940  BP: 118/74  Pulse: 74  Temp: 36.7 C  Resp: 14    Post-op Vital Signs: stable   Complications: No apparent anesthesia complications

## 2013-12-26 NOTE — Op Note (Signed)
[facility] [address] [place], [id number]   COLONOSCOPY PROCEDURE REPORT  PATIENT: [name], Melanie M.  MR#: [id number] BIRTHDATE: 10/28/1934 , 78  yrs. old GENDER: Female ENDOSCOPIST: Beverley FiedlerJay M Shaye Lagace, MD PROCEDURE DATE:  12/26/2013 PROCEDURE:   Submucosal injection, any substance and Colonoscopy with tissue ablation First Screening Colonoscopy - Avg.  risk and is 50 yrs.  old or older - No.  Prior Negative Screening - Now for repeat screening. N/A  History of Adenoma - Now for follow-up colonoscopy & has been > or = to 3 yrs.  N/A  Polyps Removed Today? No.  Recommend repeat exam, <10 yrs? No. ASA CLASS:   Class III INDICATIONS:Iron Deficiency Anemia and history of right colon angioectasia. MEDICATIONS: MAC sedation, administered by CRNA and See Anesthesia Report.  DESCRIPTION OF PROCEDURE:   After the risks benefits and alternatives of the procedure were thoroughly explained, informed consent was obtained.  A digital rectal exam revealed external hemorrhoids.   The Pentax Ped Colon [id number]  endoscope was introduced through the anus and advanced to the cecum, which was identified by both the appendix and ileocecal valve. No adverse events experienced.   The quality of the prep was good, using MoviPrep  The instrument was then slowly withdrawn as the colon was fully examined.   COLON FINDINGS: 5-6 mm angiodysplastic lesion was found in the ascending colon.  A 1:10,000 Epinephrine solution injection was given before ablation performed (2.5 cc total around and under lesion).  Destruction of lesion via APC ablation was performed (right colon setting ERBE, 0.5L/min, 20 W).  Care was given to ensure that the lumen was suctioned well.   Moderate diverticulosis was noted in the sigmoid colon.   The colon was otherwise normal. There was no inflammation, polyps or cancers seen.  Retroflexed views revealed no abnormalities.       The scope was withdrawn and the  procedure completed.  COMPLICATIONS: There were no complications.  ENDOSCOPIC IMPRESSION: 1.   5-46mm angiodysplastic lesion and in the ascending colon; epinephrine injection followed by successful APC ablation 2.   Moderate diverticulosis was noted in the sigmoid colon 3.   The colon was otherwise normal  RECOMMENDATIONS: 1.  Continue to monitor Hgb/HCT and iron studies 2.  EGD with VCE today   eSigned:  Beverley FiedlerJay M Tami Blass, MD 12/26/2013 9:48 AM cc: The Patient and Lynnea FerrierWarren [name], MD; Artis DelayNi Gorsuch, MD

## 2013-12-26 NOTE — Transfer of Care (Signed)
Immediate Anesthesia Transfer of Care Note  Patient: Dahlia ClientMarie M Amarello  Procedure(s) Performed: Procedure(s) with comments: ESOPHAGOGASTRODUODENOSCOPY (EGD) (N/A) COLONOSCOPY (N/A) HOT HEMOSTASIS (ARGON PLASMA COAGULATION/BICAP) (N/A) GIVENS CAPSULE STUDY (N/A) - Due to inablility to swallow capsule, will place Endoscopically into Small Bowel.  Patient Location: PACU  Anesthesia Type:MAC  Level of Consciousness: awake, sedated and patient cooperative  Airway & Oxygen Therapy: Patient Spontanous Breathing and Patient connected to nasal cannula oxygen  Post-op Assessment: Report given to PACU RN, Post -op Vital signs reviewed and stable and Patient moving all extremities X 4  Post vital signs: stable  Complications: No apparent anesthesia complications

## 2013-12-26 NOTE — Discharge Instructions (Signed)

## 2013-12-26 NOTE — Anesthesia Preprocedure Evaluation (Addendum)
Anesthesia Evaluation  Patient identified by MRN, date of birth, ID band Patient awake    Reviewed: Allergy & Precautions, H&P , NPO status , Patient's Chart, lab work & pertinent test results, reviewed documented beta blocker date and time   Airway Mallampati: II TM Distance: >3 FB Neck ROM: full    Dental no notable dental hx. (+) Teeth Intact, Dental Advisory Given   Pulmonary neg pulmonary ROS,  breath sounds clear to auscultation  Pulmonary exam normal       Cardiovascular hypertension, Pt. on home beta blockers Rhythm:regular Rate:Normal     Neuro/Psych negative neurological ROS  negative psych ROS   GI/Hepatic negative GI ROS, Neg liver ROS, hiatal hernia,   Endo/Other  negative endocrine ROS  Renal/GU negative Renal ROS  negative genitourinary   Musculoskeletal   Abdominal   Peds  Hematology negative hematology ROS (+)   Anesthesia Other Findings   Reproductive/Obstetrics negative OB ROS                          Anesthesia Physical Anesthesia Plan  ASA: III  Anesthesia Plan: MAC   Post-op Pain Management:    Induction:   Airway Management Planned: Nasal Cannula  Additional Equipment:   Intra-op Plan:   Post-operative Plan:   Informed Consent: I have reviewed the patients History and Physical, chart, labs and discussed the procedure including the risks, benefits and alternatives for the proposed anesthesia with the patient or authorized representative who has indicated his/her understanding and acceptance.   Dental Advisory Given  Plan Discussed with: CRNA and Surgeon  Anesthesia Plan Comments:         Anesthesia Quick Evaluation

## 2013-12-26 NOTE — H&P (Signed)
HPI: Melanie [name] is a 79 year old female with a past medical history of iron deficiency anemia, hiatal hernia, diverticulosis, arthritis, who is seen to evaluate iron deficiency anemia. She was previously seen by Dr. Sheryn Bison prior to his retirement. I saw her in January 2015. After that appointment we arranged upper endoscopy and colonoscopy with plans for video capsule endoscopy placement. She reports extreme difficult time with pills and does not feel she can swallow the pill camera on her own. No new complaints today.  She is a history of possible right-sided colonic angiodysplastic lesions and will plan a APC today if these are found   Past Medical History  Diagnosis Date  . Hypertension   . Allergy   . Hiatal hernia   . Anemia   . Cataract   . Diverticula of colon   . Arthritis     right hip is most painful and affects mobility as far as ambulating steps  . Iron deficiency anemia, unspecified 12/25/2012  . Hypercalcemia 08/18/2013  . Complication of anesthesia     after gallbladder surgery, had memory issues 2 days after for several days  . Hearing loss     Past Surgical History  Procedure Laterality Date  . Tonsillectomy    . Cataract extraction Bilateral     2008  . Cholecystectomy      laparoscopic     Current Facility-Administered Medications  Medication Dose Route Frequency Provider Last Rate Last Dose  . 0.9 %  sodium chloride infusion   Intravenous Continuous Beverley Fiedler, MD      . fentaNYL (SUBLIMAZE) injection 25-50 mcg  25-50 mcg Intravenous Q5 min PRN Gaetano Hawthorne, MD      . lactated ringers infusion   Intravenous Continuous Gaetano Hawthorne, MD 125 mL/hr at 12/26/13 0754 1,000 mL at 12/26/13 0754  . lactated ringers infusion   Intravenous Continuous Beverley Fiedler, MD        Allergies  Allergen Reactions  . Cephalexin Other (See Comments)    Affected pt's child -  Therefore , pt does not take Keflex.  . Codeine Nausea And Vomiting  .  Levofloxacin Other (See Comments)    Hallucinations.  . Morphine And Related Nausea And Vomiting  . Other     Another antibiotic that starts with cina?  . Quinolones Other (See Comments)    Hallucinations.  . Sulfa Antibiotics Nausea And Vomiting  . Amoxicillin Nausea Only  . Fexofenadine Hcl Other (See Comments)    unknown reaction  . Floxin [Ocuflox] Other (See Comments)    unknown allergy  . Neurontin [Gabapentin] Palpitations  . Septra [Bactrim] Other (See Comments)    Unknown reaction    Family History  Problem Relation Age of Onset  . Asthma Other   . Hypertension Other   . Hyperlipidemia Other   . Stroke Other   . Diabetes Mother   . Heart disease Mother   . Heart disease Father   . Diverticulosis Mother     History  Substance Use Topics  . Smoking status: Never Smoker   . Smokeless tobacco: Never Used  . Alcohol Use: No    ROS: As per history of present illness, otherwise negative  BP 154/83  Temp(Src) 97.8 F (36.6 C) (Oral)  Resp 22  Ht 5\' 2"  (1.575 m)  Wt 200 lb (90.719 kg)  BMI 36.57 kg/m2  SpO2 96% Gen: awake, alert, NAD HEENT: anicteric, op clear CV: RRR, no mrg Pulm: CTA b/l Abd:  soft, NT/ND, +BS throughout Ext: no c/c/e Neuro: nonfocal   RELEVANT LABS AND IMAGING: CBC    Component Value Date/Time   WBC 5.2 11/13/2013 1030   WBC 5.3 11/12/2011 0745   RBC 3.99 11/13/2013 1030   RBC 4.52 11/12/2011 0745   RBC 3.03* 08/22/2011 2158   HGB 11.6 11/13/2013 1030   HGB 10.1* 11/12/2011 0745   HCT 36.2 11/13/2013 1030   HCT 33.1* 11/12/2011 0745   PLT 344 11/13/2013 1030   PLT 350 11/12/2011 0745   MCV 90.7 11/13/2013 1030   MCV 73.2* 11/12/2011 0745   MCH 29.1 11/13/2013 1030   MCH 22.3* 11/12/2011 0745   MCHC 32.0 11/13/2013 1030   MCHC 30.5 11/12/2011 0745   RDW 13.4 11/13/2013 1030   RDW 20.4* 11/12/2011 0745   LYMPHSABS 1.0 11/13/2013 1030   LYMPHSABS 0.9 08/22/2011 1205   MONOABS 0.4 11/13/2013 1030   MONOABS 0.5 08/22/2011 1205   EOSABS 0.2 11/13/2013  1030   EOSABS 0.1 08/22/2011 1205   BASOSABS 0.0 11/13/2013 1030   BASOSABS 0.1 08/22/2011 1205    CMP     Component Value Date/Time   NA 143 11/13/2013 1031   NA 139 03/18/2012 1031   K 4.3 11/13/2013 1031   K 4.3 03/18/2012 1031   CL 109* 12/23/2012 1100   CL 105 03/18/2012 1031   CO2 25 11/13/2013 1031   CO2 26 03/18/2012 1031   GLUCOSE 125 11/13/2013 1031   GLUCOSE 106* 12/23/2012 1100   GLUCOSE 172* 03/18/2012 1031   BUN 13.7 11/13/2013 1031   BUN 16 03/18/2012 1031   CREATININE 0.8 11/13/2013 1031   CREATININE 0.84 03/18/2012 1031   CALCIUM 10.5* 11/13/2013 1031   CALCIUM 10.3 03/18/2012 1031   PROT 6.4 11/13/2013 1031   PROT 6.9 11/06/2011 1122   ALBUMIN 3.6 11/13/2013 1031   ALBUMIN 4.5 11/06/2011 1122   AST 15 11/13/2013 1031   AST 23 11/06/2011 1122   ALT 15 11/13/2013 1031   ALT 18 11/06/2011 1122   ALKPHOS 86 11/13/2013 1031   ALKPHOS 88 11/06/2011 1122   BILITOT 0.22 11/13/2013 1031   BILITOT 0.3 11/06/2011 1122   GFRNONAA 80* 08/22/2011 1205   GFRAA >90 08/22/2011 1205    ASSESSMENT/PLAN: 79 year old female with a past medical history of iron deficiency anemia, hiatal hernia, diverticulosis, arthritis, who is here today for endoscopy, colonoscopy, and video capsule endoscopy placement for persistent iron deficiency anemia  1. Persistent IDA -- she has required multiple IV iron infusions over the last several years and has had a persistent anemia. There is question of a right colon angiodysplasia which has not been treated, and also her small bowel has never been completely imaged.  EGD, colonoscopy, and video capsule endoscopy today. The nature of the procedure, as well as the risks, benefits, and alternatives were carefully and thoroughly reviewed with the patient. Ample time for discussion and questions allowed. The patient understood, was satisfied, and agreed to proceed.

## 2013-12-26 NOTE — Op Note (Signed)
[facility] [address] [place], [id number]   ENDOSCOPY PROCEDURE REPORT  PATIENT: [name], Melanie M.  MR#: [id number] BIRTHDATE: 02-Nov-1934 , 78  yrs. old GENDER: Female ENDOSCOPIST: Beverley FiedlerJay M Carollynn Pennywell, MD PROCEDURE DATE:  12/26/2013 PROCEDURE:  EGD w/ biopsy for H.pylori; EGD with endoscopic placement of video capsule ASA CLASS:     Class III INDICATIONS:  Iron deficiency anemia. MEDICATIONS: MAC sedation, administered by CRNA and See Anesthesia Report. TOPICAL ANESTHETIC: Cetacaine Spray  DESCRIPTION OF PROCEDURE: After the risks benefits and alternatives of the procedure were thoroughly explained, informed consent was obtained.  The Pentax Gastroscope [id number] endoscope was introduced through the mouth and advanced to the second portion of the duodenum. Without limitations.  The instrument was slowly withdrawn as the mucosa was fully examined.     ESOPHAGUS: The mucosa of the esophagus appeared normal.   A large, 6-7 cm,  hiatal hernia was noted.  STOMACH: The mucosa of the stomach appeared normal.  No Cameron's lesions or ulcers were seen. Biopsies were taken in the antrum and angularis.  DUODENUM: Four small non-bleeding shallow and clean-based ulcers were found in the duodenal bulb.  Normal-appearing second portion of the duodenum  Retroflexed views revealed a large hiatal hernia.     The scope was then withdrawn from the patient and the procedure completed.  To further evaluate the small bowel given persistent iron deficiency anemia and history of colonic angioectasia, a video capsule was endoscopically placed using the standard deployment system into the duodenal bulb.  COMPLICATIONS: There were no complications.  ENDOSCOPIC IMPRESSION: 1.   The mucosa of the esophagus appeared normal 2.   6-7 cm hiatal hernia 3.   The mucosa of the stomach appeared normal; biopsies were taken in the antrum and angularis 4.   Four small non-bleeding ulcers  were found in the duodenal bulb 5.   Endoscopic placement of video capsule into the duodenal bulb   RECOMMENDATIONS: 1.  Await pathology results 2.  Avoid NSAIDS 3.  Follow-up of helicobacter pylori status, treat if indicated 4.  Begin pantoprazole 40 mg daily for ulcer disease.  It is best to be taken 20-30 minutes prior to breakfast meal. 5.  Capsule images will be reviewed when study completed 6.  Monitor Hgb/HCT and iron studies  eSigned:  Beverley FiedlerJay M Nayleen Janosik, MD 12/26/2013 9:58 AM   CC:The Patient and Lynnea FerrierWarren [name], MD; Artis DelayNi Gorsuch, MD  PATIENT NAME:  [name], Melanie M. MR#: [id number]

## 2013-12-26 NOTE — Addendum Note (Signed)
Addendum created 12/26/13 1302 by Illene SilverJanet E Betzalel Umbarger, CRNA   Modules edited: Anesthesia Medication Administration

## 2013-12-27 ENCOUNTER — Encounter (HOSPITAL_COMMUNITY): Payer: Self-pay | Admitting: Internal Medicine

## 2013-12-28 ENCOUNTER — Encounter: Payer: Self-pay | Admitting: Internal Medicine

## 2013-12-28 ENCOUNTER — Telehealth: Payer: Self-pay | Admitting: Internal Medicine

## 2013-12-28 NOTE — Telephone Encounter (Signed)
Call pt to see what she is asking. Can use MiraLax 17g daily if needed for constipation, but not an essential med

## 2013-12-28 NOTE — Telephone Encounter (Signed)
Pt just read her discharge instructions from her colonoscopy at the hospital and Moviprep was still listed as a medication and she wanted to make sure she was not keep taking that. I told her that was the prep she took to clean out prior to her colonoscopy. And will not have to keep taking it. Pt stated her bowels are moving again. And will continue to take miralax.

## 2013-12-29 ENCOUNTER — Other Ambulatory Visit: Payer: Medicare Other | Admitting: Lab

## 2013-12-29 ENCOUNTER — Telehealth: Payer: Self-pay | Admitting: Hematology and Oncology

## 2013-12-29 ENCOUNTER — Ambulatory Visit: Payer: Medicare Other | Admitting: Oncology

## 2013-12-29 ENCOUNTER — Ambulatory Visit: Payer: Medicare Other

## 2013-12-29 NOTE — Telephone Encounter (Signed)
s.w. pt and confirmed ortho appt [facility]Marland Kitchen.Marland Kitchen..Marland Kitchen

## 2014-01-10 ENCOUNTER — Other Ambulatory Visit: Payer: Self-pay | Admitting: Family Medicine

## 2014-01-11 ENCOUNTER — Encounter: Payer: Self-pay | Admitting: Internal Medicine

## 2014-01-11 ENCOUNTER — Telehealth: Payer: Self-pay

## 2014-01-11 NOTE — Telephone Encounter (Signed)
Patient notified of the capsule endo results she is asked to call back in 3-6 months for follow up.  I have mailed her a copy of the capsule results at her request

## 2014-01-22 ENCOUNTER — Telehealth: Payer: Self-pay | Admitting: Internal Medicine

## 2014-01-22 NOTE — Telephone Encounter (Signed)
Patient reports that she can't take pantoprozole.  She had facial swelling and stopped it.  She says that the swelling resolved after a few days.  What PPI do you want to try?

## 2014-01-22 NOTE — Telephone Encounter (Signed)
Substitute omeprazole 40 mg in place of pantoprazole

## 2014-01-23 MED ORDER — OMEPRAZOLE 40 MG PO CPDR: 40.0000 mg | DELAYED_RELEASE_CAPSULE | Freq: Every day | ORAL | Status: DC

## 2014-01-23 NOTE — Telephone Encounter (Signed)
Patient notified

## 2014-02-01 ENCOUNTER — Other Ambulatory Visit: Payer: Self-pay | Admitting: Hematology and Oncology

## 2014-02-01 ENCOUNTER — Telehealth: Payer: Self-pay | Admitting: *Deleted

## 2014-02-01 NOTE — Telephone Encounter (Signed)
Pt left a message stating she has an appointment for labs/MD on 5/4. She wants to make sure Dr Bertis RuddyGorsuch draws a calcium level and ferritin

## 2014-02-12 ENCOUNTER — Ambulatory Visit (HOSPITAL_BASED_OUTPATIENT_CLINIC_OR_DEPARTMENT_OTHER): Payer: Medicare Other

## 2014-02-12 ENCOUNTER — Other Ambulatory Visit (HOSPITAL_BASED_OUTPATIENT_CLINIC_OR_DEPARTMENT_OTHER): Payer: Medicare Other

## 2014-02-12 ENCOUNTER — Ambulatory Visit (HOSPITAL_BASED_OUTPATIENT_CLINIC_OR_DEPARTMENT_OTHER): Payer: Medicare Other | Admitting: Hematology and Oncology

## 2014-02-12 ENCOUNTER — Encounter: Payer: Self-pay | Admitting: Hematology and Oncology

## 2014-02-12 ENCOUNTER — Telehealth: Payer: Self-pay | Admitting: Hematology and Oncology

## 2014-02-12 VITALS — BP 126/52 | HR 91 | Temp 98.0°F | Resp 20 | Ht 62.0 in | Wt 194.8 lb

## 2014-02-12 DIAGNOSIS — K922 Gastrointestinal hemorrhage, unspecified: Secondary | ICD-10-CM

## 2014-02-12 DIAGNOSIS — K5520 Angiodysplasia of colon without hemorrhage: Secondary | ICD-10-CM

## 2014-02-12 DIAGNOSIS — D649 Anemia, unspecified: Secondary | ICD-10-CM

## 2014-02-12 DIAGNOSIS — D509 Iron deficiency anemia, unspecified: Secondary | ICD-10-CM

## 2014-02-12 LAB — RETICULOCYTES (CHCC)
ABS Retic: 56.2 10*3/uL (ref 19.0–186.0)
RBC.: 3.51 MIL/uL — AB (ref 3.87–5.11)
Retic Ct Pct: 1.6 % (ref 0.4–2.3)

## 2014-02-12 LAB — CBC & DIFF AND RETIC
BASO%: 0.4 % (ref 0.0–2.0)
Basophils Absolute: 0 10*3/uL (ref 0.0–0.1)
EOS ABS: 0.2 10*3/uL (ref 0.0–0.5)
EOS%: 4.9 % (ref 0.0–7.0)
HEMATOCRIT: 28.4 % — AB (ref 34.8–46.6)
HEMOGLOBIN: 8.8 g/dL — AB (ref 11.6–15.9)
LYMPH%: 21.6 % (ref 14.0–49.7)
MCH: 25.4 pg (ref 25.1–34.0)
MCHC: 31 g/dL — ABNORMAL LOW (ref 31.5–36.0)
MCV: 81.8 fL (ref 79.5–101.0)
MONO#: 0.4 10*3/uL (ref 0.1–0.9)
MONO%: 7.8 % (ref 0.0–14.0)
NEUT%: 65.3 % (ref 38.4–76.8)
NEUTROS ABS: 3.1 10*3/uL (ref 1.5–6.5)
PLATELETS: 400 10*3/uL (ref 145–400)
RBC: 3.47 10*6/uL — ABNORMAL LOW (ref 3.70–5.45)
RDW: 15 % — ABNORMAL HIGH (ref 11.2–14.5)
WBC: 4.7 10*3/uL (ref 3.9–10.3)
lymph#: 1 10*3/uL (ref 0.9–3.3)
nRBC: 0 % (ref 0–0)

## 2014-02-12 LAB — COMPREHENSIVE METABOLIC PANEL (CC13)
ALT: 8 U/L (ref 0–55)
AST: 13 U/L (ref 5–34)
Albumin: 3.4 g/dL — ABNORMAL LOW (ref 3.5–5.0)
Alkaline Phosphatase: 78 U/L (ref 40–150)
Anion Gap: 9 mEq/L (ref 3–11)
BILIRUBIN TOTAL: 0.25 mg/dL (ref 0.20–1.20)
BUN: 15.4 mg/dL (ref 7.0–26.0)
CALCIUM: 10.7 mg/dL — AB (ref 8.4–10.4)
CO2: 24 mEq/L (ref 22–29)
CREATININE: 0.8 mg/dL (ref 0.6–1.1)
Chloride: 111 mEq/L — ABNORMAL HIGH (ref 98–109)
GLUCOSE: 152 mg/dL — AB (ref 70–140)
Potassium: 3.6 mEq/L (ref 3.5–5.1)
Sodium: 144 mEq/L (ref 136–145)
Total Protein: 6.3 g/dL — ABNORMAL LOW (ref 6.4–8.3)

## 2014-02-12 LAB — FERRITIN CHCC: FERRITIN: 7 ng/mL — AB (ref 9–269)

## 2014-02-12 MED ORDER — DIPHENHYDRAMINE HCL 25 MG PO CAPS: 25.0000 mg | ORAL_CAPSULE | Freq: Once | ORAL | Status: DC

## 2014-02-12 MED ORDER — SODIUM CHLORIDE 0.9 % IV SOLN
1020.0000 mg | Freq: Once | INTRAVENOUS | Status: AC
  Administered 2014-02-12: 1020 mg via INTRAVENOUS
  Filled 2014-02-12: qty 34

## 2014-02-12 MED ORDER — SODIUM CHLORIDE 0.9 % IJ SOLN
10.0000 mL | INTRAMUSCULAR | Status: DC | PRN
Start: 2014-02-12 — End: 2014-02-12
  Filled 2014-02-12: qty 10

## 2014-02-12 MED ORDER — DIPHENHYDRAMINE HCL 25 MG PO CAPS
ORAL_CAPSULE | ORAL | Status: AC
  Filled 2014-02-12: qty 1

## 2014-02-12 MED ORDER — ACETAMINOPHEN 325 MG PO TABS
650.0000 mg | ORAL_TABLET | Freq: Once | ORAL | Status: AC
  Administered 2014-02-12: 650 mg via ORAL

## 2014-02-12 MED ORDER — ACETAMINOPHEN 325 MG PO TABS
ORAL_TABLET | ORAL | Status: AC
  Filled 2014-02-12: qty 2

## 2014-02-12 NOTE — Telephone Encounter (Signed)
gve the pt her June,aug 2015 appt calendar. °

## 2014-02-12 NOTE — Progress Notes (Signed)
[place] NOTE  Melanie TOM, MD DIAGNOSIS:  Recurrent iron deficiency anemia due to GI bleed  SUMMARY OF HEMATOLOGIC HISTORY: The patient has been complaining of fatigue and was found to have severe iron deficiency anemia. Approximately a year or 2 ago, she received 2 units of blood transfusion because of this. She had extensive GI evaluation with EGD and colonoscopy and was never found to have any sort of GI bleed. She has received 4 intravenous doses of iron infusion and the last infusion in 3 months ago. Bone marrow aspirate and biopsy in January 2013 showed no evidence of malignancy or persistent absence of iron stores. In 2014, she received 6 doses of intravenous iron infusion. She was referred to gastroenterology for further evaluation due to suspected GI bleed. Repeat GI endoscopy dated 12/26/2013 showed angiodysplastic lesion and moderate diverticulosis. INTERVAL HISTORY: Melanie [name] 79 y.o. female returns for further followup. The patient is adamant that she is not bleeding. The patient denies any recent signs or symptoms of bleeding such as spontaneous epistaxis, hematuria or hematochezia. She is told me that she was informed that she has no active bleeding ulcer. She complained of fatigue. She denies any chest pain, shortness of breath or leg cramps.  I have reviewed the past medical history, past surgical history, social history and family history with the patient and they are unchanged from previous note.  ALLERGIES:  is allergic to cephalexin; codeine; levofloxacin; morphine and related; other; quinolones; sulfa antibiotics; amoxicillin; fexofenadine hcl; floxin; neurontin; and septra.  MEDICATIONS:  Current Outpatient Prescriptions  Medication Sig Dispense Refill  . aspirin 81 MG tablet Take 81 mg by mouth daily.       Marland Kitchen docusate sodium (COLACE) 100 MG capsule Take 200 mg by mouth daily before breakfast.       . metoprolol (LOPRESSOR)  50 MG tablet TAKE 1 TABLET BY MOUTH TWICE A DAY  60 tablet  11  . omeprazole (PRILOSEC) 40 MG capsule Take 1 capsule (40 mg total) by mouth daily.  30 capsule  6  . Camphor-Eucalyptus-Menthol (VICKS VAPORUB EX) Apply 1 application topically every 6 (six) hours as needed (APPLY TO NECK AS NEEDED FOR CONGESTION).      . fluticasone (FLONASE) 50 MCG/ACT nasal spray Place 1 spray into both nostrils daily.      . Menthol-Methyl Salicylate (MUSCLE RUB) 10-15 % CREA Apply 1 application topically as needed for muscle pain (apply to leg as needed).      . peg 3350 powder (MOVIPREP) 100 G SOLR Take 1 kit by mouth once.      . polyethylene glycol (MIRALAX / GLYCOLAX) packet Take 17 g by mouth daily.       Current Facility-Administered Medications  Medication Dose Route Frequency Provider Last Rate Last Dose  . diphenhydrAMINE (BENADRYL) capsule 25 mg  25 mg Oral Once Heath Lark, MD       Facility-Administered Medications Ordered in Other Visits  Medication Dose Route Frequency Provider Last Rate Last Dose  . sodium chloride 0.9 % injection 10 mL  10 mL Intracatheter PRN Heath Lark, MD         REVIEW OF SYSTEMS:   Constitutional: Denies fevers, chills or night sweats Eyes: Denies blurriness of vision Ears, nose, mouth, throat, and face: Denies mucositis or sore throat Respiratory: Denies cough, dyspnea or wheezes Cardiovascular: Denies palpitation, chest discomfort or lower extremity swelling Gastrointestinal:  Denies nausea, heartburn or change in bowel habits Skin: Denies abnormal skin  rashes Lymphatics: Denies new lymphadenopathy or easy bruising Neurological:Denies numbness, tingling or new weaknesses Behavioral/Psych: Mood is stable, no new changes  All other systems were reviewed with the patient and are negative.  PHYSICAL EXAMINATION: ECOG PERFORMANCE STATUS: 1 - Symptomatic but completely ambulatory  Filed Vitals:   02/12/14 0908  BP: 126/52  Pulse: 91  Temp: 98 F (36.7 C)  Resp:  20   Filed Weights   02/12/14 0908  Weight: 194 lb 12.8 oz (88.361 kg)    GENERAL:alert, no distress and comfortable. She looked pale SKIN: skin color, texture, turgor are normal, no rashes or significant lesions EYES: normal, Conjunctiva are pale and non-injected, sclera clear Musculoskeletal:no cyanosis of digits and no clubbing  NEURO: alert & oriented x 3 with fluent speech, no focal motor/sensory deficits  LABORATORY DATA:  I have reviewed the data as listed Results for orders placed in visit on 02/12/14 (from the past 48 hour(s))  CBC & DIFF AND RETIC     Status: Abnormal   Collection Time    02/12/14  8:49 AM      Result Value Ref Range   WBC 4.7  3.9 - 10.3 10e3/uL   NEUT# 3.1  1.5 - 6.5 10e3/uL   HGB 8.8 (*) 11.6 - 15.9 g/dL   HCT 28.4 (*) 34.8 - 46.6 %   Platelets 400  145 - 400 10e3/uL   MCV 81.8  79.5 - 101.0 fL   MCH 25.4  25.1 - 34.0 pg   MCHC 31.0 (*) 31.5 - 36.0 g/dL   RBC 3.47 (*) 3.70 - 5.45 10e6/uL   RDW 15.0 (*) 11.2 - 14.5 %   lymph# 1.0  0.9 - 3.3 10e3/uL   MONO# 0.4  0.1 - 0.9 10e3/uL   Eosinophils Absolute 0.2  0.0 - 0.5 10e3/uL   Basophils Absolute 0.0  0.0 - 0.1 10e3/uL   NEUT% 65.3  38.4 - 76.8 %   LYMPH% 21.6  14.0 - 49.7 %   MONO% 7.8  0.0 - 14.0 %   EOS% 4.9  0.0 - 7.0 %   BASO% 0.4  0.0 - 2.0 %   nRBC 0  0 - 0 %  FERRITIN [facility]     Status: Abnormal   Collection Time    02/12/14  8:49 AM      Result Value Ref Range   Ferritin 7 (*) 9 - 269 ng/ml  COMPREHENSIVE METABOLIC PANEL (PY09)     Status: Abnormal   Collection Time    02/12/14  8:49 AM      Result Value Ref Range   Sodium 144  136 - 145 mEq/L   Potassium 3.6  3.5 - 5.1 mEq/L   Chloride 111 (*) 98 - 109 mEq/L   CO2 24  22 - 29 mEq/L   Glucose 152 (*) 70 - 140 mg/dl   BUN 15.4  7.0 - 26.0 mg/dL   Creatinine 0.8  0.6 - 1.1 mg/dL   Total Bilirubin 0.25  0.20 - 1.20 mg/dL   Alkaline Phosphatase 78  40 - 150 U/L   AST 13  5 - 34 U/L   ALT 8  0 - 55 U/L   Total Protein 6.3 (*)  6.4 - 8.3 g/dL   Albumin 3.4 (*) 3.5 - 5.0 g/dL   Calcium 10.7 (*) 8.4 - 10.4 mg/dL   Anion Gap 9  3 - 11 mEq/L    Lab Results  Component Value Date   WBC 4.7 02/12/2014  HGB 8.8* 02/12/2014   HCT 28.4* 02/12/2014   MCV 81.8 02/12/2014   PLT 400 02/12/2014   ASSESSMENT & PLAN:  #1 Recurrent iron deficiency anemia, GI bleed until proven otherwise This is likely anemia of from chronic blood loss.  The patient denies recent history of bleeding such as epistaxis, hematuria or hematochezia. She is symptomatic from the anemia. She does not require transfusion now.  Repeat iron study showed ferritin down to 7. I will proceed with further iron infusion today. We discussed some of the risks, benefits, and alternatives of intravenous iron infusions. The patient is symptomatic from anemia and the iron level is critically low. She tolerated oral iron supplement poorly and desires to achieved higher levels of iron faster for adequate hematopoesis. Some of the side-effects to be expected including risks of infusion reactions, phlebitis, headaches, nausea and fatigue.  The patient is willing to proceed. Patient education material was dispensed.  Goal is to keep ferritin level greater than 50  #2 recurrent hypercalcemia Her most recent bone marrow aspirate and biopsy January 2013 show no evidence of malignancy. Additional workup excluded multiple myeloma. In the meantime, she had discontinue calcium supplements.  All questions were answered. The patient knows to call the clinic with any problems, questions or concerns. No barriers to learning was detected.     Heath Lark, MD 02/12/2014 1:04 PM

## 2014-02-12 NOTE — Patient Instructions (Signed)

## 2014-02-27 ENCOUNTER — Telehealth: Payer: Self-pay | Admitting: Hematology and Oncology

## 2014-02-27 ENCOUNTER — Telehealth: Payer: Self-pay | Admitting: *Deleted

## 2014-02-27 NOTE — Telephone Encounter (Signed)
Please order folic acid 1 mg daily, disp 90, 3 refills

## 2014-02-27 NOTE — Telephone Encounter (Signed)
pt called to r/ lab...done...pt aware of new d.t

## 2014-02-27 NOTE — Telephone Encounter (Signed)
Pt states 1 mg Folic Acid not available OTC.  Requests Rx.

## 2014-02-27 NOTE — Telephone Encounter (Signed)
Pt states Folic Acid recommended by Dr. Bertis RuddyGorsuch not available OTC and she needs a Rx.

## 2014-02-28 MED ORDER — FOLIC ACID 1 MG PO TABS: 1.0000 mg | ORAL_TABLET | Freq: Every day | ORAL | Status: DC

## 2014-02-28 NOTE — Telephone Encounter (Signed)
Informed pt of Rx sent to [facility]. She verbalized understanding.

## 2014-03-02 ENCOUNTER — Ambulatory Visit (INDEPENDENT_AMBULATORY_CARE_PROVIDER_SITE_OTHER): Payer: Medicare Other | Admitting: Family Medicine

## 2014-03-02 ENCOUNTER — Encounter: Payer: Self-pay | Admitting: Family Medicine

## 2014-03-02 VITALS — BP 128/66 | HR 78 | Temp 98.6°F | Resp 16 | Ht 62.0 in | Wt 195.0 lb

## 2014-03-02 DIAGNOSIS — H01003 Unspecified blepharitis right eye, unspecified eyelid: Secondary | ICD-10-CM

## 2014-03-02 DIAGNOSIS — H01009 Unspecified blepharitis unspecified eye, unspecified eyelid: Secondary | ICD-10-CM

## 2014-03-02 MED ORDER — ERYTHROMYCIN 5 MG/GM OP OINT: 1.0000 "application " | TOPICAL_OINTMENT | Freq: Two times a day (BID) | OPHTHALMIC | Status: DC

## 2014-03-02 NOTE — Progress Notes (Signed)
Subjective:    Patient ID: Melanie [name], female    DOB: 05-20-1935, 79 y.o.   MRN: [id number]  HPI  Patient has had red swollen eyelids for approximately one month. There is mucopurulent discharge at of her eye. The conjunctiva is spared and right and not irritated. However the lid margins are swollen and tender and erythematous. Past Medical History  Diagnosis Date  . Hypertension   . Allergy   . Hiatal hernia   . Anemia   . Cataract   . Diverticula of colon   . Arthritis     right hip is most painful and affects mobility as far as ambulating steps  . Iron deficiency anemia, unspecified 12/25/2012  . Hypercalcemia 08/18/2013  . Complication of anesthesia     after gallbladder surgery, had memory issues 2 days after for several days  . Hearing loss    Current Outpatient Prescriptions on File Prior to Visit  Medication Sig Dispense Refill  . aspirin 81 MG tablet Take 81 mg by mouth daily.       Marland Kitchen docusate sodium (COLACE) 100 MG capsule Take 200 mg by mouth daily before breakfast.       . fluticasone (FLONASE) 50 MCG/ACT nasal spray Place 1 spray into both nostrils daily.      . folic acid (FOLVITE) 1 MG tablet Take 1 tablet (1 mg total) by mouth daily.  90 tablet  3  . Menthol-Methyl Salicylate (MUSCLE RUB) 10-15 % CREA Apply 1 application topically as needed for muscle pain (apply to leg as needed).      . metoprolol (LOPRESSOR) 50 MG tablet TAKE 1 TABLET BY MOUTH TWICE A DAY  60 tablet  11  . omeprazole (PRILOSEC) 40 MG capsule Take 1 capsule (40 mg total) by mouth daily.  30 capsule  6  . polyethylene glycol (MIRALAX / GLYCOLAX) packet Take 17 g by mouth daily.       No current facility-administered medications on file prior to visit.   Allergies  Allergen Reactions  . Cephalexin Other (See Comments)    Affected pt's child -  Therefore , pt does not take Keflex.  . Codeine Nausea And Vomiting  . Levofloxacin Other (See Comments)    Hallucinations.  . Morphine And  Related Nausea And Vomiting  . Other     Another antibiotic that starts with cina?  . Quinolones Other (See Comments)    Hallucinations.  . Sulfa Antibiotics Nausea And Vomiting  . Amoxicillin Nausea Only  . Fexofenadine Hcl Other (See Comments)    unknown reaction  . Floxin [Ocuflox] Other (See Comments)    unknown allergy  . Neurontin [Gabapentin] Palpitations  . Septra [Bactrim] Other (See Comments)    Unknown reaction   History   Social History  . Marital Status: Married    Spouse Name: N/A    Number of Children: 3  . Years of Education: N/A   Occupational History  . Retired    Social History Main Topics  . Smoking status: Never Smoker   . Smokeless tobacco: Never Used  . Alcohol Use: No  . Drug Use: No  . Sexual Activity: Not Currently   Other Topics Concern  . Not on file   Social History Narrative  . No narrative on file     Review of Systems  All other systems reviewed and are negative.      Objective:   Physical Exam  Vitals reviewed. Eyes: Lids are everted and swept,  no foreign bodies found. Right eye exhibits discharge and exudate. Right eye exhibits no chemosis and no hordeolum. No foreign body present in the right eye.  Cardiovascular: Normal rate and regular rhythm.   Pulmonary/Chest: Effort normal and breath sounds normal.          Assessment & Plan:  1. Blepharitis, right eye Erythromycin ophthalmic ointment place one application into the right eye twice a day for possibly 5 days. Apply warm compresses 2-3 times a day..  recheck if no better in one week - erythromycin ophthalmic ointment; Place 1 application into the right eye 2 (two) times daily.  Dispense: 3.5 g; Refill: 0

## 2014-03-12 ENCOUNTER — Other Ambulatory Visit (HOSPITAL_BASED_OUTPATIENT_CLINIC_OR_DEPARTMENT_OTHER): Payer: Medicare Other

## 2014-03-12 ENCOUNTER — Other Ambulatory Visit: Payer: Self-pay | Admitting: Hematology and Oncology

## 2014-03-12 ENCOUNTER — Telehealth: Payer: Self-pay | Admitting: *Deleted

## 2014-03-12 DIAGNOSIS — D509 Iron deficiency anemia, unspecified: Secondary | ICD-10-CM

## 2014-03-12 DIAGNOSIS — K5520 Angiodysplasia of colon without hemorrhage: Secondary | ICD-10-CM

## 2014-03-12 DIAGNOSIS — D649 Anemia, unspecified: Secondary | ICD-10-CM

## 2014-03-12 LAB — CBC & DIFF AND RETIC
BASO%: 0.5 % (ref 0.0–2.0)
Basophils Absolute: 0 10*3/uL (ref 0.0–0.1)
EOS%: 6.6 % (ref 0.0–7.0)
Eosinophils Absolute: 0.3 10*3/uL (ref 0.0–0.5)
HCT: 38.2 % (ref 34.8–46.6)
HGB: 12 g/dL (ref 11.6–15.9)
IMMATURE RETIC FRACT: 6.9 % (ref 1.60–10.00)
LYMPH%: 24.8 % (ref 14.0–49.7)
MCH: 27.5 pg (ref 25.1–34.0)
MCHC: 31.4 g/dL — ABNORMAL LOW (ref 31.5–36.0)
MCV: 87.4 fL (ref 79.5–101.0)
MONO#: 0.4 10*3/uL (ref 0.1–0.9)
MONO%: 9.7 % (ref 0.0–14.0)
NEUT#: 2.4 10*3/uL (ref 1.5–6.5)
NEUT%: 58.4 % (ref 38.4–76.8)
Platelets: 263 10*3/uL (ref 145–400)
RBC: 4.37 10*6/uL (ref 3.70–5.45)
RDW: 19 % — ABNORMAL HIGH (ref 11.2–14.5)
Retic %: 2.05 % (ref 0.70–2.10)
Retic Ct Abs: 89.59 10*3/uL (ref 33.70–90.70)
WBC: 4.1 10*3/uL (ref 3.9–10.3)
lymph#: 1 10*3/uL (ref 0.9–3.3)

## 2014-03-12 LAB — FERRITIN CHCC: Ferritin: 138 ng/ml (ref 9–269)

## 2014-03-12 NOTE — Telephone Encounter (Signed)
STAT ferritin will still take one hour. Her next appt to see me is in 2 months, iv iron is scheduled

## 2014-03-12 NOTE — Telephone Encounter (Signed)
Message copied by Rolanda Jay on Mon Mar 12, 2014  2:12 PM ------      Message from: [facility], [facility]      Created: Mon Mar 12, 2014 11:32 AM      Regarding: labs        No need iron, recheck as scheduled      ----- Message -----         From: [facility]         Sent: 03/12/2014   8:43 AM           To: [name], MD                   ------

## 2014-03-12 NOTE — Telephone Encounter (Signed)
I have added IV iron on 6/26 and 8/4, ferritin ordered STAT

## 2014-03-12 NOTE — Telephone Encounter (Signed)
Informed pt of her Ferritin level good today .  No need for iv iron.  Pt verbalized understanding and also wants Dr. Bertis Ruddy to know she just started her Folic Acid on 4/68.  She asks if Ferritin can be ordered STAT from now on so she doesn't have to wait as long for results.  She also wants IV Iron appt added to every lab appt so she will not have to come back for IV iron if needed.  She requests lab and iron appt on same day and results run STAT.   Pt was extremely insistent on having it scheduled this way.

## 2014-03-13 ENCOUNTER — Telehealth: Payer: Self-pay | Admitting: Hematology and Oncology

## 2014-03-13 NOTE — Telephone Encounter (Signed)
s.w. pt and advised on iv iron added after already sched appts....pt ok and aware

## 2014-04-06 ENCOUNTER — Other Ambulatory Visit: Payer: Self-pay | Admitting: Hematology and Oncology

## 2014-04-06 ENCOUNTER — Ambulatory Visit (HOSPITAL_BASED_OUTPATIENT_CLINIC_OR_DEPARTMENT_OTHER): Payer: Medicare Other

## 2014-04-06 ENCOUNTER — Other Ambulatory Visit (HOSPITAL_BASED_OUTPATIENT_CLINIC_OR_DEPARTMENT_OTHER): Payer: Medicare Other

## 2014-04-06 VITALS — BP 137/56 | HR 64 | Temp 98.4°F

## 2014-04-06 DIAGNOSIS — D649 Anemia, unspecified: Secondary | ICD-10-CM

## 2014-04-06 DIAGNOSIS — K5520 Angiodysplasia of colon without hemorrhage: Secondary | ICD-10-CM

## 2014-04-06 DIAGNOSIS — D509 Iron deficiency anemia, unspecified: Secondary | ICD-10-CM

## 2014-04-06 DIAGNOSIS — K922 Gastrointestinal hemorrhage, unspecified: Secondary | ICD-10-CM

## 2014-04-06 LAB — CBC & DIFF AND RETIC
BASO%: 0.5 % (ref 0.0–2.0)
Basophils Absolute: 0 10*3/uL (ref 0.0–0.1)
EOS ABS: 0.3 10*3/uL (ref 0.0–0.5)
EOS%: 3.4 % (ref 0.0–7.0)
HCT: 42.8 % (ref 34.8–46.6)
HGB: 13.7 g/dL (ref 11.6–15.9)
Immature Retic Fract: 6.6 % (ref 1.60–10.00)
LYMPH#: 1.8 10*3/uL (ref 0.9–3.3)
LYMPH%: 22.9 % (ref 14.0–49.7)
MCH: 27.1 pg (ref 25.1–34.0)
MCHC: 32 g/dL (ref 31.5–36.0)
MCV: 84.8 fL (ref 79.5–101.0)
MONO#: 0.6 10*3/uL (ref 0.1–0.9)
MONO%: 7.4 % (ref 0.0–14.0)
NEUT%: 65.8 % (ref 38.4–76.8)
NEUTROS ABS: 5.2 10*3/uL (ref 1.5–6.5)
PLATELETS: 339 10*3/uL (ref 145–400)
RBC: 5.05 10*6/uL (ref 3.70–5.45)
RDW: 18.2 % — AB (ref 11.2–14.5)
RETIC %: 1.47 % (ref 0.70–2.10)
RETIC CT ABS: 74.24 10*3/uL (ref 33.70–90.70)
WBC: 7.8 10*3/uL (ref 3.9–10.3)

## 2014-04-06 LAB — FERRITIN CHCC: FERRITIN: 27 ng/mL (ref 9–269)

## 2014-04-06 MED ORDER — SODIUM CHLORIDE 0.9 % IV SOLN
1020.0000 mg | Freq: Once | INTRAVENOUS | Status: AC
  Administered 2014-04-06: 1020 mg via INTRAVENOUS
  Filled 2014-04-06: qty 34

## 2014-04-06 MED ORDER — ACETAMINOPHEN 325 MG PO TABS
ORAL_TABLET | ORAL | Status: AC
Start: 2014-04-06 — End: 2014-04-06
  Filled 2014-04-06: qty 2

## 2014-04-06 MED ORDER — DIPHENHYDRAMINE HCL 25 MG PO TABS
25.0000 mg | ORAL_TABLET | Freq: Once | ORAL | Status: AC
  Administered 2014-04-06: 25 mg via ORAL
  Filled 2014-04-06: qty 1

## 2014-04-06 MED ORDER — ACETAMINOPHEN 325 MG PO TABS
650.0000 mg | ORAL_TABLET | Freq: Once | ORAL | Status: AC
  Administered 2014-04-06: 650 mg via ORAL

## 2014-04-06 MED ORDER — DIPHENHYDRAMINE HCL 25 MG PO CAPS
ORAL_CAPSULE | ORAL | Status: AC
  Filled 2014-04-06: qty 1

## 2014-04-06 NOTE — Patient Instructions (Signed)

## 2014-04-09 ENCOUNTER — Other Ambulatory Visit: Payer: Medicare Other

## 2014-05-07 ENCOUNTER — Other Ambulatory Visit: Payer: Medicare Other

## 2014-05-11 ENCOUNTER — Telehealth: Payer: Self-pay | Admitting: *Deleted

## 2014-05-11 NOTE — Telephone Encounter (Signed)
Pt left VM wants to make sure her Ferritin next week is ordered STAT.  It is ordered STAT and also added to appt note.  Pt would also like more information on "malabsorption diseases" on next office visit.

## 2014-05-15 ENCOUNTER — Encounter: Payer: Self-pay | Admitting: *Deleted

## 2014-05-15 ENCOUNTER — Ambulatory Visit: Payer: Medicare Other

## 2014-05-15 ENCOUNTER — Telehealth: Payer: Self-pay | Admitting: Hematology and Oncology

## 2014-05-15 ENCOUNTER — Other Ambulatory Visit: Payer: Self-pay | Admitting: *Deleted

## 2014-05-15 ENCOUNTER — Other Ambulatory Visit (HOSPITAL_BASED_OUTPATIENT_CLINIC_OR_DEPARTMENT_OTHER): Payer: Medicare Other

## 2014-05-15 ENCOUNTER — Encounter: Payer: Self-pay | Admitting: Hematology and Oncology

## 2014-05-15 ENCOUNTER — Ambulatory Visit (HOSPITAL_BASED_OUTPATIENT_CLINIC_OR_DEPARTMENT_OTHER): Payer: Medicare Other | Admitting: Hematology and Oncology

## 2014-05-15 VITALS — BP 158/78 | HR 72 | Temp 98.3°F | Resp 18 | Ht 62.0 in | Wt 200.8 lb

## 2014-05-15 DIAGNOSIS — K5520 Angiodysplasia of colon without hemorrhage: Secondary | ICD-10-CM

## 2014-05-15 DIAGNOSIS — K31811 Angiodysplasia of stomach and duodenum with bleeding: Secondary | ICD-10-CM

## 2014-05-15 DIAGNOSIS — D509 Iron deficiency anemia, unspecified: Secondary | ICD-10-CM

## 2014-05-15 DIAGNOSIS — D649 Anemia, unspecified: Secondary | ICD-10-CM

## 2014-05-15 LAB — CBC & DIFF AND RETIC
BASO%: 0.4 % (ref 0.0–2.0)
Basophils Absolute: 0 10*3/uL (ref 0.0–0.1)
EOS%: 5.7 % (ref 0.0–7.0)
Eosinophils Absolute: 0.3 10*3/uL (ref 0.0–0.5)
HCT: 43 % (ref 34.8–46.6)
HGB: 14.1 g/dL (ref 11.6–15.9)
Immature Retic Fract: 3.1 % (ref 1.60–10.00)
LYMPH%: 21.8 % (ref 14.0–49.7)
MCH: 28.4 pg (ref 25.1–34.0)
MCHC: 32.8 g/dL (ref 31.5–36.0)
MCV: 86.5 fL (ref 79.5–101.0)
MONO#: 0.3 10*3/uL (ref 0.1–0.9)
MONO%: 6.7 % (ref 0.0–14.0)
NEUT#: 3.3 10*3/uL (ref 1.5–6.5)
NEUT%: 65.4 % (ref 38.4–76.8)
Platelets: 238 10*3/uL (ref 145–400)
RBC: 4.97 10*6/uL (ref 3.70–5.45)
RDW: 16.2 % — ABNORMAL HIGH (ref 11.2–14.5)
RETIC %: 1.5 % (ref 0.70–2.10)
Retic Ct Abs: 74.55 10*3/uL (ref 33.70–90.70)
WBC: 5.1 10*3/uL (ref 3.9–10.3)
lymph#: 1.1 10*3/uL (ref 0.9–3.3)

## 2014-05-15 LAB — FERRITIN CHCC: FERRITIN: 312 ng/mL — AB (ref 9–269)

## 2014-05-15 NOTE — Telephone Encounter (Signed)
gv and printed appt sched and avs for pt for Sept thru Jan 2016

## 2014-05-15 NOTE — Progress Notes (Signed)
Gave pt copy of lab results and informed of elevated ferritin level.  No need for IV iron today and infusion appt canceled.  Keep next appt in 6 wks as scheduled.  Pt verbalized understanding.

## 2014-05-15 NOTE — Assessment & Plan Note (Signed)
The patient have chronic GI bleed from angiodysplasia. She is concerned about spacing out her appointment and is wondering about possible malabsorption syndrome. I told her that even if I can prove she has Celiac disease, it would not change her management. It appears that her blood count has stabilized over the last 2 months. I will make her appointment every 6 weeks with blood work and iron infusion as indicated to keep ferritin greater than 50. I will see her back in 6 months.

## 2014-05-15 NOTE — Progress Notes (Signed)
[facility] OFFICE PROGRESS NOTE  [name],[name], MD SUMMARY OF HEMATOLOGIC HISTORY: The patient has been complaining of fatigue and was found to have severe iron deficiency anemia. Approximately a year or 2 ago, she received 2 units of blood transfusion because of this. She had extensive GI evaluation with EGD and colonoscopy and was never found to have any sort of GI bleed. She has received 4 intravenous doses of iron infusion and the last infusion in 3 months ago. Bone marrow aspirate and biopsy in January 2013 showed no evidence of malignancy or persistent absence of iron stores. In 2014, she received 6 doses of intravenous iron infusion. She was referred to gastroenterology for further evaluation due to suspected GI bleed. Repeat GI endoscopy dated 12/26/2013 showed angiodysplastic lesion and moderate diverticulosis. The patient had received numerous intravenous iron infusion almost on a monthly basis from 03/15/2013 to 04/06/2014. INTERVAL HISTORY: Melanie [name] 79 y.o. female returns for further followup. She denies recent bleeding. She complained of mild fatigue.  I have reviewed the past medical history, past surgical history, social history and family history with the patient and they are unchanged from previous note.  ALLERGIES:  is allergic to cephalexin; codeine; levofloxacin; morphine and related; other; quinolones; sulfa antibiotics; amoxicillin; fexofenadine hcl; floxin; neurontin; and septra.  MEDICATIONS:  Current Outpatient Prescriptions  Medication Sig Dispense Refill  . aspirin 81 MG tablet Take 81 mg by mouth daily.       . Cyanocobalamin (VITAMIN B12 PO) Take 1,500 mcg by mouth daily.      Marland Kitchen. docusate sodium (COLACE) 100 MG capsule Take 200 mg by mouth daily before breakfast.       . erythromycin ophthalmic ointment Place 1 application into the right eye 2 (two) times daily.  3.5 g  0  . fluticasone (FLONASE) 50 MCG/ACT nasal spray Place 1 spray into  both nostrils daily.      . folic acid (FOLVITE) 1 MG tablet Take 1 tablet (1 mg total) by mouth daily.  90 tablet  3  . Menthol-Methyl Salicylate (MUSCLE RUB) 10-15 % CREA Apply 1 application topically as needed for muscle pain (apply to leg as needed).      . metoprolol (LOPRESSOR) 50 MG tablet TAKE 1 TABLET BY MOUTH TWICE A DAY  60 tablet  11  . omeprazole (PRILOSEC) 40 MG capsule Take 1 capsule (40 mg total) by mouth daily.  30 capsule  6  . polyethylene glycol (MIRALAX / GLYCOLAX) packet Take 17 g by mouth daily.       No current facility-administered medications for this visit.     REVIEW OF SYSTEMS:   Constitutional: Denies fevers, chills or night sweats Eyes: Denies blurriness of vision Ears, nose, mouth, throat, and face: Denies mucositis or sore throat Respiratory: Denies cough, dyspnea or wheezes Cardiovascular: Denies palpitation, chest discomfort or lower extremity swelling Gastrointestinal:  Denies nausea, heartburn or change in bowel habits Skin: Denies abnormal skin rashes Lymphatics: Denies new lymphadenopathy or easy bruising Neurological:Denies numbness, tingling or new weaknesses Behavioral/Psych: Mood is stable, no new changes  All other systems were reviewed with the patient and are negative.  PHYSICAL EXAMINATION: ECOG PERFORMANCE STATUS: 0 - Asymptomatic  Filed Vitals:   05/15/14 0908  BP: 158/78  Pulse: 72  Temp: 98.3 F (36.8 C)  Resp: 18   Filed Weights   05/15/14 0908  Weight: 200 lb 12.8 oz (91.082 kg)    GENERAL:alert, no distress and comfortable SKIN: skin color, texture,  turgor are normal, no rashes or significant lesions EYES: normal, Conjunctiva are pink and non-injected, sclera clear Musculoskeletal:no cyanosis of digits and no clubbing  NEURO: alert & oriented x 3 with fluent speech, no focal motor/sensory deficits  LABORATORY DATA:  I have reviewed the data as listed Results for orders placed in visit on 05/15/14 (from the past 48  hour(s))  CBC & DIFF AND RETIC     Status: Abnormal   Collection Time    05/15/14  8:54 AM      Result Value Ref Range   WBC 5.1  3.9 - 10.3 10e3/uL   NEUT# 3.3  1.5 - 6.5 10e3/uL   HGB 14.1  11.6 - 15.9 g/dL   HCT 29.5  62.1 - 30.8 %   Platelets 238  145 - 400 10e3/uL   MCV 86.5  79.5 - 101.0 fL   MCH 28.4  25.1 - 34.0 pg   MCHC 32.8  31.5 - 36.0 g/dL   RBC 6.57  8.46 - 9.62 10e6/uL   RDW 16.2 (*) 11.2 - 14.5 %   lymph# 1.1  0.9 - 3.3 10e3/uL   MONO# 0.3  0.1 - 0.9 10e3/uL   Eosinophils Absolute 0.3  0.0 - 0.5 10e3/uL   Basophils Absolute 0.0  0.0 - 0.1 10e3/uL   NEUT% 65.4  38.4 - 76.8 %   LYMPH% 21.8  14.0 - 49.7 %   MONO% 6.7  0.0 - 14.0 %   EOS% 5.7  0.0 - 7.0 %   BASO% 0.4  0.0 - 2.0 %   Retic % 1.50  0.70 - 2.10 %   Retic Ct Abs 74.55  33.70 - 90.70 10e3/uL   Immature Retic Fract 3.10  1.60 - 10.00 %  [facility]ERR[facility]IN [facility]     Status: Abnormal   Collection Time    05/15/14  8:54 AM      Result Value Ref Range   Ferritin 312 (*) 9 - 269 ng/ml    Lab Results  Component Value Date   WBC 5.1 05/15/2014   HGB 14.1 05/15/2014   HCT 43.0 05/15/2014   MCV 86.5 05/15/2014   PLT 238 05/15/2014   ASSESSMENT & PLAN:  Iron deficiency anemia, unspecified The patient have chronic GI bleed from angiodysplasia. She is concerned about spacing out her appointment and is wondering about possible malabsorption syndrome. I told her that even if I can prove she has Celiac disease, it would not change her management. It appears that her blood count has stabilized over the last 2 months. I will make her appointment every 6 weeks with blood work and iron infusion as indicated to keep ferritin greater than 50. I will see her back in 6 months.    All questions were answered. The patient knows to call the clinic with any problems, questions or concerns. No barriers to learning was detected.  I spent 15 minutes counseling the patient face to face. The total time spent in the appointment was 20 minutes  and more than 50% was on counseling.     Lorayne Getchell, MD 05/15/2014 10:36 AM

## 2014-05-21 NOTE — Telephone Encounter (Signed)
Phone call - encounter closed. 

## 2014-06-26 ENCOUNTER — Other Ambulatory Visit (HOSPITAL_BASED_OUTPATIENT_CLINIC_OR_DEPARTMENT_OTHER): Payer: Medicare Other

## 2014-06-26 ENCOUNTER — Ambulatory Visit: Payer: Medicare Other

## 2014-06-26 DIAGNOSIS — D649 Anemia, unspecified: Secondary | ICD-10-CM

## 2014-06-26 DIAGNOSIS — K5520 Angiodysplasia of colon without hemorrhage: Secondary | ICD-10-CM

## 2014-06-26 DIAGNOSIS — D509 Iron deficiency anemia, unspecified: Secondary | ICD-10-CM

## 2014-06-26 LAB — CBC & DIFF AND RETIC
BASO%: 0.3 % (ref 0.0–2.0)
BASOS ABS: 0 10*3/uL (ref 0.0–0.1)
EOS%: 4.2 % (ref 0.0–7.0)
Eosinophils Absolute: 0.3 10*3/uL (ref 0.0–0.5)
HEMATOCRIT: 42.3 % (ref 34.8–46.6)
HEMOGLOBIN: 14 g/dL (ref 11.6–15.9)
IMMATURE RETIC FRACT: 1.5 % — AB (ref 1.60–10.00)
LYMPH#: 1.1 10*3/uL (ref 0.9–3.3)
LYMPH%: 17.7 % (ref 14.0–49.7)
MCH: 29 pg (ref 25.1–34.0)
MCHC: 33.1 g/dL (ref 31.5–36.0)
MCV: 87.6 fL (ref 79.5–101.0)
MONO#: 0.3 10*3/uL (ref 0.1–0.9)
MONO%: 4.7 % (ref 0.0–14.0)
NEUT#: 4.3 10*3/uL (ref 1.5–6.5)
NEUT%: 73.1 % (ref 38.4–76.8)
Platelets: 243 10*3/uL (ref 145–400)
RBC: 4.83 10*6/uL (ref 3.70–5.45)
RDW: 14.9 % — ABNORMAL HIGH (ref 11.2–14.5)
Retic %: 1.93 % (ref 0.70–2.10)
Retic Ct Abs: 93.22 10*3/uL — ABNORMAL HIGH (ref 33.70–90.70)
WBC: 5.9 10*3/uL (ref 3.9–10.3)

## 2014-06-26 LAB — FERRITIN CHCC: Ferritin: 257 ng/ml (ref 9–269)

## 2014-06-26 NOTE — Progress Notes (Signed)
Pt's ferritin level 257 today. Per Dr. Bertis Ruddy pt will not need feraheme infusion today. Dr. Bertis Ruddy states ferritin level has been stable and pt does not need to come for October labs if she is comfortable with that and just come in December.  Pt states she wants to come in October.

## 2014-08-07 ENCOUNTER — Other Ambulatory Visit: Payer: Self-pay | Admitting: *Deleted

## 2014-08-07 ENCOUNTER — Ambulatory Visit: Payer: Medicare Other

## 2014-08-07 ENCOUNTER — Other Ambulatory Visit (HOSPITAL_BASED_OUTPATIENT_CLINIC_OR_DEPARTMENT_OTHER): Payer: Medicare Other

## 2014-08-07 DIAGNOSIS — D509 Iron deficiency anemia, unspecified: Secondary | ICD-10-CM

## 2014-08-07 DIAGNOSIS — K5520 Angiodysplasia of colon without hemorrhage: Secondary | ICD-10-CM

## 2014-08-07 DIAGNOSIS — D649 Anemia, unspecified: Secondary | ICD-10-CM

## 2014-08-07 LAB — CBC & DIFF AND RETIC
BASO%: 0.5 % (ref 0.0–2.0)
BASOS ABS: 0 10*3/uL (ref 0.0–0.1)
EOS ABS: 0.3 10*3/uL (ref 0.0–0.5)
EOS%: 4.9 % (ref 0.0–7.0)
HCT: 42.4 % (ref 34.8–46.6)
HEMOGLOBIN: 14.1 g/dL (ref 11.6–15.9)
Immature Retic Fract: 3.3 % (ref 1.60–10.00)
LYMPH%: 17.4 % (ref 14.0–49.7)
MCH: 29.7 pg (ref 25.1–34.0)
MCHC: 33.3 g/dL (ref 31.5–36.0)
MCV: 89.5 fL (ref 79.5–101.0)
MONO#: 0.5 10*3/uL (ref 0.1–0.9)
MONO%: 8.5 % (ref 0.0–14.0)
NEUT#: 3.8 10*3/uL (ref 1.5–6.5)
NEUT%: 68.7 % (ref 38.4–76.8)
PLATELETS: 248 10*3/uL (ref 145–400)
RBC: 4.74 10*6/uL (ref 3.70–5.45)
RDW: 13.3 % (ref 11.2–14.5)
RETIC CT ABS: 103.81 10*3/uL — AB (ref 33.70–90.70)
Retic %: 2.19 % — ABNORMAL HIGH (ref 0.70–2.10)
WBC: 5.5 10*3/uL (ref 3.9–10.3)
lymph#: 1 10*3/uL (ref 0.9–3.3)

## 2014-08-07 LAB — FERRITIN CHCC: Ferritin: 254 ng/ml (ref 9–269)

## 2014-08-07 NOTE — Progress Notes (Signed)
Ferritin level 254 today.  Per office note, feraheme infusion needed for ferritin levels under 50. No infusion necessary.  Spoke to patient in lobby, patient given copy of labs.  Pt verbalized understanding.

## 2014-08-08 ENCOUNTER — Telehealth: Payer: Self-pay | Admitting: *Deleted

## 2014-08-08 NOTE — Telephone Encounter (Signed)
Made patient aware of her normal lab results and MD feels OK to skip December lab/infusion. Melanie [name] is not comfortable with this and wants her keep her appointments on 09/18/14.

## 2014-08-08 NOTE — Telephone Encounter (Signed)
Message copied by Wandalee FerdinandOWARD, Ellyce Lafevers L on Wed Aug 08, 2014  9:58 AM ------      Message from: Estill BakesWINDHAM, PHONACELLE C      Created: Wed Aug 08, 2014  8:36 AM      Regarding: FW: cbc                   ----- Message -----         From: Artis DelayNi Gorsuch, MD         Sent: 08/07/2014  12:19 PM           To: Rolanda JayPhonacelle C Windham, RN      Subject: cbc                                                      I recommend NOT drawing labs/infusion next month and just to wait until her Jan visit. Please ask her if this is acceptable and if so, please cancel      ----- Message -----         From: [facility]         Sent: 08/07/2014   9:22 AM           To: Artis DelayNi Gorsuch, MD                   ------

## 2014-08-22 ENCOUNTER — Other Ambulatory Visit: Payer: Self-pay | Admitting: Hematology and Oncology

## 2014-08-22 ENCOUNTER — Other Ambulatory Visit: Payer: Self-pay | Admitting: Family Medicine

## 2014-08-22 ENCOUNTER — Telehealth: Payer: Self-pay | Admitting: Family Medicine

## 2014-08-22 DIAGNOSIS — M810 Age-related osteoporosis without current pathological fracture: Secondary | ICD-10-CM

## 2014-08-22 DIAGNOSIS — Z1231 Encounter for screening mammogram for malignant neoplasm of breast: Secondary | ICD-10-CM

## 2014-08-22 NOTE — Telephone Encounter (Signed)
OK to do-

## 2014-08-22 NOTE — Telephone Encounter (Signed)
[facility] to know [facility]  Please call her back if questions to [phone]

## 2014-08-23 NOTE — Telephone Encounter (Signed)
ok 

## 2014-08-23 NOTE — Telephone Encounter (Signed)
Order placed for bone density  

## 2014-09-17 ENCOUNTER — Other Ambulatory Visit: Payer: Self-pay | Admitting: Internal Medicine

## 2014-09-18 ENCOUNTER — Other Ambulatory Visit (HOSPITAL_BASED_OUTPATIENT_CLINIC_OR_DEPARTMENT_OTHER): Payer: Medicare Other

## 2014-09-18 ENCOUNTER — Ambulatory Visit: Payer: Medicare Other

## 2014-09-18 DIAGNOSIS — D509 Iron deficiency anemia, unspecified: Secondary | ICD-10-CM

## 2014-09-18 DIAGNOSIS — D649 Anemia, unspecified: Secondary | ICD-10-CM

## 2014-09-18 DIAGNOSIS — K5520 Angiodysplasia of colon without hemorrhage: Secondary | ICD-10-CM

## 2014-09-18 LAB — CBC & DIFF AND RETIC
BASO%: 0.4 % (ref 0.0–2.0)
BASOS ABS: 0 10*3/uL (ref 0.0–0.1)
EOS%: 5.5 % (ref 0.0–7.0)
Eosinophils Absolute: 0.3 10*3/uL (ref 0.0–0.5)
HEMATOCRIT: 44.5 % (ref 34.8–46.6)
HGB: 14.7 g/dL (ref 11.6–15.9)
Immature Retic Fract: 4 % (ref 1.60–10.00)
LYMPH%: 20.3 % (ref 14.0–49.7)
MCH: 29.7 pg (ref 25.1–34.0)
MCHC: 33 g/dL (ref 31.5–36.0)
MCV: 89.9 fL (ref 79.5–101.0)
MONO#: 0.3 10*3/uL (ref 0.1–0.9)
MONO%: 6.4 % (ref 0.0–14.0)
NEUT%: 67.4 % (ref 38.4–76.8)
NEUTROS ABS: 3.1 10*3/uL (ref 1.5–6.5)
Platelets: 279 10*3/uL (ref 145–400)
RBC: 4.95 10*6/uL (ref 3.70–5.45)
RDW: 12.9 % (ref 11.2–14.5)
RETIC %: 1.78 % (ref 0.70–2.10)
RETIC CT ABS: 88.11 10*3/uL (ref 33.70–90.70)
WBC: 4.5 10*3/uL (ref 3.9–10.3)
lymph#: 0.9 10*3/uL (ref 0.9–3.3)

## 2014-09-18 LAB — FERRITIN CHCC: FERRITIN: 214 ng/mL (ref 9–269)

## 2014-09-18 NOTE — Progress Notes (Signed)
Pt entered center today for labs and possible treatment. Labs  Returned with Ferritin level of 214. It is noted that in Dr. Maxine GlennGorsuch's note that pateints goal was to keep Ferritin level greater than 50. Results  Reviewed with patient. Pt instructed that infusion was not needed at this time. Pt instructed to  Keep future appointments. Pt verbalized understanding of instructions.

## 2014-09-21 ENCOUNTER — Ambulatory Visit (HOSPITAL_COMMUNITY)
Admission: RE | Admit: 2014-09-21 | Discharge: 2014-09-21 | Disposition: A | Discharge status: Home / Self Care | Payer: Medicare Other | Source: Ambulatory Visit | Attending: Family Medicine | Admitting: Family Medicine

## 2014-09-21 DIAGNOSIS — Z1231 Encounter for screening mammogram for malignant neoplasm of breast: Secondary | ICD-10-CM | POA: Diagnosis not present

## 2014-09-21 DIAGNOSIS — D509 Iron deficiency anemia, unspecified: Secondary | ICD-10-CM | POA: Insufficient documentation

## 2014-09-21 DIAGNOSIS — M810 Age-related osteoporosis without current pathological fracture: Secondary | ICD-10-CM

## 2014-10-03 ENCOUNTER — Encounter: Payer: Self-pay | Admitting: Family Medicine

## 2014-10-09 ENCOUNTER — Encounter: Payer: Self-pay | Admitting: Family Medicine

## 2014-10-09 ENCOUNTER — Ambulatory Visit (INDEPENDENT_AMBULATORY_CARE_PROVIDER_SITE_OTHER): Payer: Medicare Other | Admitting: Family Medicine

## 2014-10-09 VITALS — BP 100/70 | HR 76 | Temp 98.4°F | Resp 18 | Ht 62.0 in | Wt 197.0 lb

## 2014-10-09 DIAGNOSIS — R062 Wheezing: Secondary | ICD-10-CM

## 2014-10-09 NOTE — Progress Notes (Signed)
Subjective:    Patient ID: Melanie [name], female    DOB: January 02, 1935, 79 y.o.   MRN: [id number]  HPI  Patient states that she has had episodic wheezing for the last 4 years. It occurs occasionally. She primarily hears at night when she is lying down. She denies any shortness of breath. She denies any dyspnea on exertion. She has no history of asthma. She has 3 children who all 3 have asthma. She was around significant secondhand smoke exposure for more than 20 years. Her previous partner was a 3 pack a day smoker. She denies any occupational exposures such as working in a Medical laboratory scientific [employer] or N/A dusty environment. She denies any fevers or chills or weight loss. On examination today her lungs are completely clear. Pulmonary function tests show an FEV1 to FVC ratio of 81% which is normal. FEV1 is 1.41 L which is 76% of predicted. Overall this is relatively normal. She is taking a beta blocker for her blood pressure. She also has acid reflux. She denies any sinuses at the present time her allergies contributing to her wheezing. Past Medical History  Diagnosis Date  . Hypertension   . Allergy   . Hiatal hernia   . Anemia   . Cataract   . Diverticula of colon   . Arthritis     right hip is most painful and affects mobility as far as ambulating steps  . Iron deficiency anemia, unspecified 12/25/2012  . Hypercalcemia 08/18/2013  . Complication of anesthesia     after gallbladder surgery, had memory issues 2 days after for several days  . Hearing loss    Past Surgical History  Procedure Laterality Date  . Tonsillectomy    . Cataract extraction Bilateral     2008  . Cholecystectomy      laparoscopic   . Esophagogastroduodenoscopy N/A 12/26/2013    Procedure: ESOPHAGOGASTRODUODENOSCOPY (EGD);  Surgeon: Beverley FiedlerJay M Pyrtle, MD;  Location[facility];  Service: Gastroenterology;  Laterality: N/A;  . Colonoscopy N/A 12/26/2013    Procedure: COLONOSCOPY;  Surgeon: Beverley FiedlerJay M Pyrtle, MD;  Location: [facility];   Service: Gastroenterology;  Laterality: N/A;  . Hot hemostasis N/A 12/26/2013    Procedure: HOT HEMOSTASIS (ARGON PLASMA COAGULATION/BICAP);  Surgeon: Beverley FiedlerJay M Pyrtle, MD;  Location: [facility];  Service: Gastroenterology;  Laterality: N/A;  . Givens capsule study N/A 12/26/2013    Procedure: GIVENS CAPSULE STUDY;  Surgeon: Beverley FiedlerJay M Pyrtle, MD;  Location: [facility];  Service: Gastroenterology;  Laterality: N/A;  Due to inablility to swallow capsule, will place Endoscopically into Small Bowel.   Current Outpatient Prescriptions on File Prior to Visit  Medication Sig Dispense Refill  . aspirin 81 MG tablet Take 81 mg by mouth daily.     . Cyanocobalamin (VITAMIN B12 PO) Take 1,500 mcg by mouth daily.    Marland Kitchen. docusate sodium (COLACE) 100 MG capsule Take 200 mg by mouth daily before breakfast.     . fluticasone (FLONASE) 50 MCG/ACT nasal spray Place 1 spray into both nostrils daily.    . folic acid (FOLVITE) 1 MG tablet Take 1 tablet (1 mg total) by mouth daily. 90 tablet 3  . Menthol-Methyl Salicylate (MUSCLE RUB) 10-15 % CREA Apply 1 application topically as needed for muscle pain (apply to leg as needed).    . metoprolol (LOPRESSOR) 50 MG tablet TAKE 1 TABLET BY MOUTH TWICE A DAY 60 tablet 11  . omeprazole (PRILOSEC) 40 MG capsule TAKE ONE CAPSULE EVERY DAY 30 capsule 0  .  polyethylene glycol (MIRALAX / GLYCOLAX) packet Take 17 g by mouth daily.     No current facility-administered medications on file prior to visit.   Allergies  Allergen Reactions  . Cephalexin Other (See Comments)    Affected pt's child -  Therefore , pt does not take Keflex.  . Codeine Nausea And Vomiting  . Levofloxacin Other (See Comments)    Hallucinations.  . Morphine And Related Nausea And Vomiting  . Other     Another antibiotic that starts with cina?  . Quinolones Other (See Comments)    Hallucinations.  . Sulfa Antibiotics Nausea And Vomiting  . Amoxicillin Nausea Only  . Fexofenadine Hcl Other (See Comments)     unknown reaction  . Floxin [Ocuflox] Other (See Comments)    unknown allergy  . Neurontin [Gabapentin] Palpitations  . Septra [Bactrim] Other (See Comments)    Unknown reaction   History   Social History  . Marital Status: Married    Spouse Name: N/A    Number of Children: 3  . Years of Education: N/A   Occupational History  . Retired    Social History Main Topics  . Smoking status: Never Smoker   . Smokeless tobacco: Never Used  . Alcohol Use: No  . Drug Use: No  . Sexual Activity: Not Currently   Other Topics Concern  . Not on file   Social History Narrative     Review of Systems  All other systems reviewed and are negative.      Objective:   Physical Exam  Constitutional: She appears well-developed and well-nourished.  HENT:  Right Ear: External ear normal.  Left Ear: External ear normal.  Nose: Nose normal.  Mouth/Throat: Oropharynx is clear and moist. No oropharyngeal exudate.  Eyes: Conjunctivae are normal.  Neck: Neck supple.  Cardiovascular: Normal rate, regular rhythm, normal heart sounds and intact distal pulses.   No murmur heard. Pulmonary/Chest: Effort normal and breath sounds normal. No respiratory distress. She has no wheezes. She has no rales. She exhibits no tenderness.  Abdominal: Soft. Bowel sounds are normal. She exhibits no distension. There is no tenderness. There is no rebound and no guarding.  Musculoskeletal: She exhibits no edema.  Lymphadenopathy:    She has no cervical adenopathy.  Vitals reviewed.         Assessment & Plan:  Wheezing  Patient's exam today and her pulmonary function tests are relatively normal. Differential diagnosis includes mild asthma which is episodic and intermittent, laryngo-esophageal reflux disease triggering bronchospasms, postnasal drip triggering bronchospasm. The patient is also on a beta blocker for high blood pressure. I gave the patient options to try to stop the beta blocker to see if the  wheezing improves versus trying an empiric trial of albuterol to be used on an as-needed basis. The patient would like to discontinue the beta blocker. Decrease metoprolol to 25 mg by mouth twice a day for 2 weeks and then 25 mg once a day for 2 weeks and then stop. Monitor her blood pressure. Her blood pressure increases greater than 140/90 on start her back on a different medication. If wheezing does not improve she can try albuterol as needed. Also consider maximizing treatment for allergies and laryngo-esophageal reflux if persistent.

## 2014-10-16 ENCOUNTER — Other Ambulatory Visit: Payer: Self-pay | Admitting: Internal Medicine

## 2014-10-19 ENCOUNTER — Encounter: Payer: Self-pay | Admitting: Family Medicine

## 2014-10-30 ENCOUNTER — Ambulatory Visit: Payer: Medicare Other | Admitting: Hematology and Oncology

## 2014-10-30 ENCOUNTER — Ambulatory Visit: Payer: Medicare Other

## 2014-10-30 ENCOUNTER — Other Ambulatory Visit: Payer: Medicare Other

## 2014-12-11 ENCOUNTER — Telehealth: Payer: Self-pay | Admitting: Hematology and Oncology

## 2014-12-11 NOTE — Telephone Encounter (Signed)
returned call and sched f/u appt per pt...pt ok and aware

## 2014-12-12 ENCOUNTER — Telehealth: Payer: Self-pay | Admitting: Internal Medicine

## 2014-12-12 ENCOUNTER — Telehealth: Payer: Self-pay | Admitting: Family Medicine

## 2014-12-12 MED ORDER — OMEPRAZOLE 40 MG PO CPDR: 40.0000 mg | DELAYED_RELEASE_CAPSULE | Freq: Every day | ORAL | Status: DC

## 2014-12-12 NOTE — Telephone Encounter (Signed)
Rx sent 

## 2014-12-12 NOTE — Telephone Encounter (Signed)
[phone] PT states that she has discussed with Dr Tanya NonesPickard that she is going on March 15th to see Dr Hyacinth MeekerMiller the dentist to have a tooth pulled and she is wanting to remind dr pickard of that incase of some type of approval?

## 2014-12-12 NOTE — Telephone Encounter (Signed)
Pt does not need authorization as long as medicare/medicaid, dentist office will call me if needed authorization for office visit.

## 2014-12-25 ENCOUNTER — Other Ambulatory Visit: Payer: Self-pay | Admitting: Family Medicine

## 2015-01-01 ENCOUNTER — Telehealth: Payer: Self-pay | Admitting: Hematology and Oncology

## 2015-01-01 ENCOUNTER — Ambulatory Visit (HOSPITAL_BASED_OUTPATIENT_CLINIC_OR_DEPARTMENT_OTHER): Payer: Medicare Other

## 2015-01-01 ENCOUNTER — Encounter: Payer: Self-pay | Admitting: Hematology and Oncology

## 2015-01-01 ENCOUNTER — Telehealth: Payer: Self-pay | Admitting: *Deleted

## 2015-01-01 ENCOUNTER — Other Ambulatory Visit: Payer: Self-pay | Admitting: Hematology and Oncology

## 2015-01-01 ENCOUNTER — Ambulatory Visit (HOSPITAL_BASED_OUTPATIENT_CLINIC_OR_DEPARTMENT_OTHER): Payer: Medicare Other | Admitting: Hematology and Oncology

## 2015-01-01 VITALS — BP 128/66 | HR 84 | Temp 98.0°F | Resp 19 | Ht 62.0 in | Wt 187.6 lb

## 2015-01-01 DIAGNOSIS — D509 Iron deficiency anemia, unspecified: Secondary | ICD-10-CM

## 2015-01-01 DIAGNOSIS — K5520 Angiodysplasia of colon without hemorrhage: Secondary | ICD-10-CM

## 2015-01-01 DIAGNOSIS — D649 Anemia, unspecified: Secondary | ICD-10-CM

## 2015-01-01 LAB — CBC & DIFF AND RETIC
BASO%: 0.9 % (ref 0.0–2.0)
BASOS ABS: 0.1 10*3/uL (ref 0.0–0.1)
EOS%: 1.3 % (ref 0.0–7.0)
Eosinophils Absolute: 0.1 10*3/uL (ref 0.0–0.5)
HCT: 28.4 % — ABNORMAL LOW (ref 34.8–46.6)
HGB: 8.8 g/dL — ABNORMAL LOW (ref 11.6–15.9)
Immature Retic Fract: 11.5 % — ABNORMAL HIGH (ref 1.60–10.00)
LYMPH%: 21.1 % (ref 14.0–49.7)
MCH: 26.7 pg (ref 25.1–34.0)
MCHC: 31 g/dL — ABNORMAL LOW (ref 31.5–36.0)
MCV: 86.1 fL (ref 79.5–101.0)
MONO#: 0.4 10*3/uL (ref 0.1–0.9)
MONO%: 7.7 % (ref 0.0–14.0)
NEUT#: 3.7 10*3/uL (ref 1.5–6.5)
NEUT%: 69 % (ref 38.4–76.8)
PLATELETS: 392 10*3/uL (ref 145–400)
RBC: 3.3 10*6/uL — ABNORMAL LOW (ref 3.70–5.45)
RDW: 14 % (ref 11.2–14.5)
RETIC CT ABS: 78.54 10*3/uL (ref 33.70–90.70)
Retic %: 2.38 % — ABNORMAL HIGH (ref 0.70–2.10)
WBC: 5.3 10*3/uL (ref 3.9–10.3)
lymph#: 1.1 10*3/uL (ref 0.9–3.3)
nRBC: 0 % (ref 0–0)

## 2015-01-01 LAB — FERRITIN CHCC: Ferritin: 8 ng/ml — ABNORMAL LOW (ref 9–269)

## 2015-01-01 LAB — HOLD TUBE, BLOOD BANK

## 2015-01-01 NOTE — Telephone Encounter (Signed)
added appt per staff...per staff cameo will contact pt.

## 2015-01-01 NOTE — Assessment & Plan Note (Signed)
The most likely cause of her anemia is due to chronic blood loss/malabsorption syndrome. We discussed some of the risks, benefits, and alternatives of intravenous iron infusions. The patient is symptomatic from anemia and the iron level is critically low. She tolerated oral iron supplement poorly and desires to achieved higher levels of iron faster for adequate hematopoesis. Some of the side-effects to be expected including risks of infusion reactions, phlebitis, headaches, nausea and fatigue.  The patient is willing to proceed. Patient education material was dispensed.  Goal is to keep ferritin level greater than 100. She is profoundly anemic today. If her hemoglobin dropped to less than 8g, we will give her 1 unit of blood instead.

## 2015-01-01 NOTE — Telephone Encounter (Signed)
Per staff message and POF I have scheduled appts. Advised scheduler of appts. JMW  

## 2015-01-01 NOTE — Progress Notes (Signed)
[facility] OFFICE PROGRESS NOTE  [name],[name], MD SUMMARY OF HEMATOLOGIC HISTORY:  The patient has been complaining of fatigue and was found to have severe iron deficiency anemia. Approximately a year or 2 ago, she received 2 units of blood transfusion because of this. She had extensive GI evaluation with EGD and colonoscopy and was never found to have any sort of GI bleed. She has received 4 intravenous doses of iron infusion and the last infusion in 3 months ago. Bone marrow aspirate and biopsy in January 2013 showed no evidence of malignancy or persistent absence of iron stores. In 2014, she received 6 doses of intravenous iron infusion. She was referred to gastroenterology for further evaluation due to suspected GI bleed. Repeat GI endoscopy dated 12/26/2013 showed angiodysplastic lesion and moderate diverticulosis. The patient had received numerous intravenous iron infusion almost on a monthly basis from 03/15/2013 to 04/06/2014. INTERVAL HISTORY: Melanie [name] 80 y.o. female returns for further follow-up. The patient is upset because her partner has recently passed away from metastatic gastric cancer. She ruminates about his demise and she is concerned that she may die from cancer. She is up-to-date with all screening modality. She complained of some fatigue. The patient denies any recent signs or symptoms of bleeding such as spontaneous epistaxis, hematuria or hematochezia.   I have reviewed the past medical history, past surgical history, social history and family history with the patient and they are unchanged from previous note.  ALLERGIES:  is allergic to cephalexin; codeine; levofloxacin; morphine and related; other; quinolones; sulfa antibiotics; tramadol; amoxicillin; fexofenadine hcl; floxin; neurontin; and septra.  MEDICATIONS:  Current Outpatient Prescriptions  Medication Sig Dispense Refill  . aspirin 81 MG tablet Take 81 mg by mouth daily.     .  Cyanocobalamin (VITAMIN B12 PO) Take 1,500 mcg by mouth daily.    Marland Kitchen. docusate sodium (COLACE) 100 MG capsule Take 200 mg by mouth daily before breakfast.     . fluticasone (FLONASE) 50 MCG/ACT nasal spray Place 1 spray into both nostrils daily.    . folic acid (FOLVITE) 1 MG tablet Take 1 tablet (1 mg total) by mouth daily. 90 tablet 3  . Magnesium 400 MG CAPS Take 400 mg by mouth daily.    . Menthol-Methyl Salicylate (MUSCLE RUB) 10-15 % CREA Apply 1 application topically as needed for muscle pain (apply to leg as needed).    . metoprolol (LOPRESSOR) 50 MG tablet TAKE 1 TABLET BY MOUTH TWICE A DAY 60 tablet 11  . omeprazole (PRILOSEC) 40 MG capsule Take 1 capsule (40 mg total) by mouth daily. 30 capsule 1  . penicillin v potassium (VEETID) 500 MG tablet   0  . polyethylene glycol (MIRALAX / GLYCOLAX) packet Take 17 g by mouth daily.     No current facility-administered medications for this visit.     REVIEW OF SYSTEMS:   Constitutional: Denies fevers, chills or night sweats Eyes: Denies blurriness of vision Ears, nose, mouth, throat, and face: Denies mucositis or sore throat Respiratory: Denies cough, dyspnea or wheezes Cardiovascular: Denies palpitation, chest discomfort or lower extremity swelling Gastrointestinal:  Denies nausea, heartburn or change in bowel habits Skin: Denies abnormal skin rashes Lymphatics: Denies new lymphadenopathy or easy bruising Neurological:Denies numbness, tingling or new weaknesses Behavioral/Psych: Mood is stable, no new changes  All other systems were reviewed with the patient and are negative.  PHYSICAL EXAMINATION: ECOG PERFORMANCE STATUS: 0 - Asymptomatic  Filed Vitals:   01/01/15 1205  BP: 128/66  Pulse: 84  Temp: 98 F (36.7 C)  Resp: 19   Filed Weights   01/01/15 1205  Weight: 187 lb 9.6 oz (85.095 kg)    GENERAL:alert, no distress and comfortable SKIN: skin color Is pale, texture, turgor are normal, no rashes or significant  lesions EYES: normal, Conjunctiva are pale and non-injected, sclera clear Musculoskeletal:no cyanosis of digits and no clubbing  NEURO: alert & oriented x 3 with fluent speech, no focal motor/sensory deficits  LABORATORY DATA:  I have reviewed the data as listed No results found for this or any previous visit (from the past 48 hour(s)).  Lab Results  Component Value Date   WBC 5.3 01/01/2015   HGB 8.8* 01/01/2015   HCT 28.4* 01/01/2015   MCV 86.1 01/01/2015   PLT 392 01/01/2015   ASSESSMENT & PLAN:  Iron deficiency anemia The most likely cause of her anemia is due to chronic blood loss/malabsorption syndrome. We discussed some of the risks, benefits, and alternatives of intravenous iron infusions. The patient is symptomatic from anemia and the iron level is critically low. She tolerated oral iron supplement poorly and desires to achieved higher levels of iron faster for adequate hematopoesis. Some of the side-effects to be expected including risks of infusion reactions, phlebitis, headaches, nausea and fatigue.  The patient is willing to proceed. Patient education material was dispensed.  Goal is to keep ferritin level greater than 100. She is profoundly anemic today. If her hemoglobin dropped to less than 8g, we will give her 1 unit of blood instead.  I spent some time discussing about screening modality for cancer. I shared my sympathy to the patient's recent grievance related to the death of her significant other.  All questions were answered. The patient knows to call the clinic with any problems, questions or concerns. No barriers to learning was detected.  I spent 15 minutes counseling the patient face to face. The total time spent in the appointment was 20 minutes and more than 50% was on counseling.     [facility], Eupha Lobb, MD 3/22/20164:42 PM

## 2015-01-01 NOTE — Telephone Encounter (Signed)
Informed pt Dr. Bertis RuddyGorsuch wants to recheck her CBC when she returns for iron on 3/31 and she may need transfusion that day as well as the iron.  Pt verbalized understanding.

## 2015-01-01 NOTE — Telephone Encounter (Signed)
Pt confirmed labs/ov per 03/22 POF, gave pt AVS and Calendar.... KJ

## 2015-01-01 NOTE — Telephone Encounter (Signed)
Mailed schedule to pt confirming labs/ov and Feraheme, per previous note MD desk nurse contacted pt but I advised pt I would mailed schedule to them confirming updated information... KJ

## 2015-01-07 ENCOUNTER — Telehealth: Payer: Self-pay | Admitting: *Deleted

## 2015-01-07 NOTE — Telephone Encounter (Signed)
Patient called and left message regarding her appts. I have called her back and gave her the appts.

## 2015-01-10 ENCOUNTER — Ambulatory Visit (HOSPITAL_BASED_OUTPATIENT_CLINIC_OR_DEPARTMENT_OTHER): Payer: Medicare Other

## 2015-01-10 ENCOUNTER — Other Ambulatory Visit: Payer: Self-pay | Admitting: Hematology and Oncology

## 2015-01-10 ENCOUNTER — Other Ambulatory Visit: Payer: Self-pay | Admitting: *Deleted

## 2015-01-10 ENCOUNTER — Ambulatory Visit: Payer: Medicare Other

## 2015-01-10 ENCOUNTER — Other Ambulatory Visit (HOSPITAL_BASED_OUTPATIENT_CLINIC_OR_DEPARTMENT_OTHER): Payer: Medicare Other

## 2015-01-10 DIAGNOSIS — D509 Iron deficiency anemia, unspecified: Secondary | ICD-10-CM

## 2015-01-10 DIAGNOSIS — D649 Anemia, unspecified: Secondary | ICD-10-CM

## 2015-01-10 DIAGNOSIS — K5520 Angiodysplasia of colon without hemorrhage: Secondary | ICD-10-CM

## 2015-01-10 LAB — CBC & DIFF AND RETIC
BASO%: 0.4 % (ref 0.0–2.0)
Basophils Absolute: 0 10*3/uL (ref 0.0–0.1)
EOS ABS: 0.1 10*3/uL (ref 0.0–0.5)
EOS%: 2.3 % (ref 0.0–7.0)
HEMATOCRIT: 27.4 % — AB (ref 34.8–46.6)
HGB: 8.4 g/dL — ABNORMAL LOW (ref 11.6–15.9)
LYMPH%: 20.3 % (ref 14.0–49.7)
MCH: 25.6 pg (ref 25.1–34.0)
MCHC: 30.7 g/dL — ABNORMAL LOW (ref 31.5–36.0)
MCV: 83.5 fL (ref 79.5–101.0)
MONO#: 0.5 10*3/uL (ref 0.1–0.9)
MONO%: 9.6 % (ref 0.0–14.0)
NEUT#: 3.6 10*3/uL (ref 1.5–6.5)
NEUT%: 67.4 % (ref 38.4–76.8)
Platelets: 392 10*3/uL (ref 145–400)
RBC: 3.28 10*6/uL — ABNORMAL LOW (ref 3.70–5.45)
RDW: 15.5 % — ABNORMAL HIGH (ref 11.2–14.5)
WBC: 5.3 10*3/uL (ref 3.9–10.3)
lymph#: 1.1 10*3/uL (ref 0.9–3.3)

## 2015-01-10 LAB — RETICULOCYTES (CHCC)
ABS Retic: 49.8 10*3/uL (ref 19.0–186.0)
RBC.: 3.32 MIL/uL — AB (ref 3.87–5.11)
RETIC CT PCT: 1.5 % (ref 0.4–2.3)

## 2015-01-10 LAB — HOLD TUBE, BLOOD BANK

## 2015-01-10 LAB — FERRITIN CHCC: FERRITIN: 7 ng/mL — AB (ref 9–269)

## 2015-01-10 MED ORDER — DIPHENHYDRAMINE HCL 25 MG PO CAPS
25.0000 mg | ORAL_CAPSULE | Freq: Once | ORAL | Status: AC
  Administered 2015-01-10: 25 mg via ORAL

## 2015-01-10 MED ORDER — DIPHENHYDRAMINE HCL 25 MG PO CAPS
ORAL_CAPSULE | ORAL | Status: AC
  Filled 2015-01-10: qty 1

## 2015-01-10 MED ORDER — ACETAMINOPHEN 325 MG PO TABS
650.0000 mg | ORAL_TABLET | Freq: Once | ORAL | Status: AC
  Administered 2015-01-10: 650 mg via ORAL

## 2015-01-10 MED ORDER — SODIUM CHLORIDE 0.9 % IV SOLN
Freq: Once | INTRAVENOUS | Status: AC
  Administered 2015-01-10: 13:00:00 via INTRAVENOUS

## 2015-01-10 MED ORDER — SODIUM CHLORIDE 0.9 % IV SOLN
510.0000 mg | Freq: Once | INTRAVENOUS | Status: AC
  Administered 2015-01-10: 510 mg via INTRAVENOUS
  Filled 2015-01-10: qty 17

## 2015-01-10 MED ORDER — ACETAMINOPHEN 325 MG PO TABS
ORAL_TABLET | ORAL | Status: AC
  Filled 2015-01-10: qty 2

## 2015-01-10 NOTE — Progress Notes (Signed)
Per Dr.Gorsuch, proceed with Iron treatment, no blood to be given today.

## 2015-01-10 NOTE — Patient Instructions (Signed)

## 2015-01-17 ENCOUNTER — Ambulatory Visit (HOSPITAL_BASED_OUTPATIENT_CLINIC_OR_DEPARTMENT_OTHER): Payer: Medicare Other

## 2015-01-17 ENCOUNTER — Encounter (HOSPITAL_BASED_OUTPATIENT_CLINIC_OR_DEPARTMENT_OTHER): Payer: Medicare Other | Admitting: Hematology and Oncology

## 2015-01-17 DIAGNOSIS — K5520 Angiodysplasia of colon without hemorrhage: Secondary | ICD-10-CM

## 2015-01-17 DIAGNOSIS — D649 Anemia, unspecified: Secondary | ICD-10-CM

## 2015-01-17 DIAGNOSIS — D509 Iron deficiency anemia, unspecified: Secondary | ICD-10-CM

## 2015-01-17 LAB — CBC & DIFF AND RETIC
BASO%: 0.8 % (ref 0.0–2.0)
Basophils Absolute: 0 10*3/uL (ref 0.0–0.1)
EOS ABS: 0.2 10*3/uL (ref 0.0–0.5)
EOS%: 3.1 % (ref 0.0–7.0)
HCT: 31.6 % — ABNORMAL LOW (ref 34.8–46.6)
HEMOGLOBIN: 9.6 g/dL — AB (ref 11.6–15.9)
Immature Retic Fract: 18.4 % — ABNORMAL HIGH (ref 1.60–10.00)
LYMPH#: 0.7 10*3/uL — AB (ref 0.9–3.3)
LYMPH%: 12.2 % — ABNORMAL LOW (ref 14.0–49.7)
MCH: 25.7 pg (ref 25.1–34.0)
MCHC: 30.5 g/dL — ABNORMAL LOW (ref 31.5–36.0)
MCV: 84.3 fL (ref 79.5–101.0)
MONO#: 0.3 10*3/uL (ref 0.1–0.9)
MONO%: 5.9 % (ref 0.0–14.0)
NEUT#: 4.3 10*3/uL (ref 1.5–6.5)
NEUT%: 78 % — ABNORMAL HIGH (ref 38.4–76.8)
NRBC: 0 % (ref 0–0)
PLATELETS: 363 10*3/uL (ref 145–400)
RBC: 3.75 10*6/uL (ref 3.70–5.45)
RDW: 21.1 % — ABNORMAL HIGH (ref 11.2–14.5)
RETIC %: 7.87 % — AB (ref 0.70–2.10)
Retic Ct Abs: 295.13 10*3/uL — ABNORMAL HIGH (ref 33.70–90.70)
WBC: 5.5 10*3/uL (ref 3.9–10.3)

## 2015-01-17 LAB — HOLD TUBE, BLOOD BANK

## 2015-01-17 LAB — FERRITIN CHCC: Ferritin: 249 ng/ml (ref 9–269)

## 2015-01-17 MED ORDER — ACETAMINOPHEN 325 MG PO TABS
650.0000 mg | ORAL_TABLET | Freq: Once | ORAL | Status: AC
  Administered 2015-01-17: 650 mg via ORAL

## 2015-01-17 MED ORDER — SODIUM CHLORIDE 0.9 % IV SOLN
Freq: Once | INTRAVENOUS | Status: AC
  Administered 2015-01-17: 13:00:00 via INTRAVENOUS

## 2015-01-17 MED ORDER — ACETAMINOPHEN 325 MG PO TABS
ORAL_TABLET | ORAL | Status: AC
  Filled 2015-01-17: qty 2

## 2015-01-17 MED ORDER — DIPHENHYDRAMINE HCL 25 MG PO CAPS
ORAL_CAPSULE | ORAL | Status: AC
  Filled 2015-01-17: qty 1

## 2015-01-17 MED ORDER — DIPHENHYDRAMINE HCL 25 MG PO CAPS
25.0000 mg | ORAL_CAPSULE | Freq: Once | ORAL | Status: AC
  Administered 2015-01-17: 25 mg via ORAL

## 2015-01-17 MED ORDER — FERUMOXYTOL INJECTION 510 MG/17 ML
510.0000 mg | Freq: Once | INTRAVENOUS | Status: AC
  Administered 2015-01-17: 510 mg via INTRAVENOUS
  Filled 2015-01-17: qty 17

## 2015-01-17 NOTE — Patient Instructions (Signed)

## 2015-01-18 ENCOUNTER — Encounter: Payer: Self-pay | Admitting: *Deleted

## 2015-01-20 ENCOUNTER — Other Ambulatory Visit: Payer: Self-pay | Admitting: Family Medicine

## 2015-01-20 NOTE — Progress Notes (Signed)
This encounter was created in error - please disregard.

## 2015-01-22 ENCOUNTER — Other Ambulatory Visit: Payer: Self-pay | Admitting: Hematology and Oncology

## 2015-01-24 ENCOUNTER — Other Ambulatory Visit (HOSPITAL_BASED_OUTPATIENT_CLINIC_OR_DEPARTMENT_OTHER): Payer: Medicare Other | Admitting: *Deleted

## 2015-01-24 ENCOUNTER — Other Ambulatory Visit (HOSPITAL_BASED_OUTPATIENT_CLINIC_OR_DEPARTMENT_OTHER): Payer: Medicare Other

## 2015-01-24 ENCOUNTER — Telehealth: Payer: Self-pay | Admitting: *Deleted

## 2015-01-24 ENCOUNTER — Other Ambulatory Visit: Payer: Self-pay | Admitting: *Deleted

## 2015-01-24 DIAGNOSIS — D509 Iron deficiency anemia, unspecified: Secondary | ICD-10-CM

## 2015-01-24 LAB — CBC WITH DIFFERENTIAL/PLATELET
BASO%: 0.2 % (ref 0.0–2.0)
Basophils Absolute: 0 10*3/uL (ref 0.0–0.1)
EOS%: 3.8 % (ref 0.0–7.0)
Eosinophils Absolute: 0.2 10*3/uL (ref 0.0–0.5)
HEMATOCRIT: 36.6 % (ref 34.8–46.6)
HEMOGLOBIN: 11.3 g/dL — AB (ref 11.6–15.9)
LYMPH#: 0.9 10*3/uL (ref 0.9–3.3)
LYMPH%: 17.1 % (ref 14.0–49.7)
MCH: 27.1 pg (ref 25.1–34.0)
MCHC: 30.9 g/dL — AB (ref 31.5–36.0)
MCV: 87.8 fL (ref 79.5–101.0)
MONO#: 0.4 10*3/uL (ref 0.1–0.9)
MONO%: 7.4 % (ref 0.0–14.0)
NEUT#: 3.6 10*3/uL (ref 1.5–6.5)
NEUT%: 71.5 % (ref 38.4–76.8)
Platelets: 309 10*3/uL (ref 145–400)
RBC: 4.17 10*6/uL (ref 3.70–5.45)
RDW: 20.9 % — ABNORMAL HIGH (ref 11.2–14.5)
WBC: 5 10*3/uL (ref 3.9–10.3)
nRBC: 0 % (ref 0–0)

## 2015-01-24 LAB — HOLD TUBE, BLOOD BANK

## 2015-01-24 NOTE — Telephone Encounter (Signed)
-----   Message from Artis DelayNi Gorsuch, MD sent at 01/24/2015 12:05 PM EDT ----- No need blood ----- Message -----    From: [facility] Interface    Sent: 01/24/2015  11:59 AM      To: Artis DelayNi Gorsuch, MD

## 2015-01-25 LAB — FERRITIN CHCC: Ferritin: 366 ng/ml — ABNORMAL HIGH (ref 9–269)

## 2015-01-25 NOTE — Telephone Encounter (Signed)
Left VM for pt informing of labs "good" to call back and ask for nurse is she would like specific results.

## 2015-01-28 ENCOUNTER — Telehealth: Payer: Self-pay | Admitting: *Deleted

## 2015-01-28 NOTE — Telephone Encounter (Signed)
Informed pt of her CBC and Ferritin results from last week were good.  Pt verbalized understanding.

## 2015-02-14 ENCOUNTER — Encounter: Payer: Self-pay | Admitting: Internal Medicine

## 2015-02-14 ENCOUNTER — Ambulatory Visit (INDEPENDENT_AMBULATORY_CARE_PROVIDER_SITE_OTHER): Payer: Medicare Other | Admitting: Internal Medicine

## 2015-02-14 VITALS — BP 134/70 | HR 68 | Ht 62.0 in | Wt 192.1 lb

## 2015-02-14 DIAGNOSIS — K5520 Angiodysplasia of colon without hemorrhage: Secondary | ICD-10-CM | POA: Diagnosis not present

## 2015-02-14 DIAGNOSIS — D509 Iron deficiency anemia, unspecified: Secondary | ICD-10-CM | POA: Diagnosis not present

## 2015-02-14 DIAGNOSIS — Z8719 Personal history of other diseases of the digestive system: Secondary | ICD-10-CM

## 2015-02-14 NOTE — Progress Notes (Signed)
Subjective:    Patient ID: Melanie [name], female    DOB: 1935-04-02, 80 y.o.   MRN: [id number]  HPI Melanie [name] is a 80 year old female with a past medical history of iron deficiency anemia, hiatal hernia, duodenal ulcers, colonic angiodysplasias who is seen in follow-up. She was last seen in the office in January 2015 after which she had complete endoscopies to evaluate persistent iron deficiency anemia. Upper endoscopy performed on 12/26/2013 revealed a 6-7 cm hiatal hernia without Cameron's lesions. Normal gastric mucosa and for small nonbleeding duodenal bulb ulcers. Biopsies of the stomach showed minimal chronic inflammation but no H. pylori, dysplasia or malignancy. Video capsule endoscopy showed patchy erythema felt to have little clinical significance. Colonoscopy performed on the same day showed an angiodysplastic lesion in the ascending colon treated with APC ablation, moderate sigmoid diverticulosis and was otherwise normal colonoscopy. She's been treated by hematology with intermittent IV iron with good result. However earlier this year she was found to be again anemic after having missed iron infusions. Iron infusion was restarted and hemoglobin has improved.   She reports she is feeling well without specific GI complaint. No heartburn. No abdominal pain. No trouble swallowing. No black or tarry stools. No blood in her stool. No constipation or diarrhea.  Unfortunately she did lose her long-term partner to metastatic cancer. This has been very upsetting for her  Review of Systems As per history of present illness, otherwise negative  Current Medications, Allergies, Past Medical History, Past Surgical History, Family History and Social History were reviewed in [facility] electronic medical record.     Objective:   Physical Exam BP 134/70 mmHg  Pulse 68  Ht 5\' 2"  (1.575 m)  Wt 192 lb 2 oz (87.147 kg)  BMI 35.13 kg/m2 Constitutional: Well-developed and well-nourished.  No distress. HEENT: Normocephalic and atraumatic. Oropharynx is clear and moist. No oropharyngeal exudate. Conjunctivae are normal.  No scleral icterus. Neck: Neck supple. Trachea midline. Cardiovascular: Normal rate, regular rhythm and intact distal pulses.  Pulmonary/chest: Effort normal and breath sounds normal. No wheezing, rales or rhonchi. Abdominal: Soft, nontender, nondistended. Bowel sounds active throughout.  Extremities: no clubbing, cyanosis, or edema Neurological: Alert and oriented to person place and time. Skin: Skin is warm and dry. No rashes noted. Psychiatric: Normal mood and affect. Behavior is normal.  CBC    Component Value Date/Time   WBC 5.0 01/24/2015 1150   WBC 5.3 11/12/2011 0745   RBC 4.17 01/24/2015 1150   RBC 3.32* 01/10/2015 1150   RBC 4.52 11/12/2011 0745   HGB 11.3* 01/24/2015 1150   HGB 10.1* 11/12/2011 0745   HCT 36.6 01/24/2015 1150   HCT 33.1* 11/12/2011 0745   PLT 309 01/24/2015 1150   PLT 350 11/12/2011 0745   MCV 87.8 01/24/2015 1150   MCV 73.2* 11/12/2011 0745   MCH 27.1 01/24/2015 1150   MCH 22.3* 11/12/2011 0745   MCHC 30.9* 01/24/2015 1150   MCHC 30.5 11/12/2011 0745   RDW 20.9* 01/24/2015 1150   RDW 20.4* 11/12/2011 0745   LYMPHSABS 0.9 01/24/2015 1150   LYMPHSABS 0.9 08/22/2011 1205   MONOABS 0.4 01/24/2015 1150   MONOABS 0.5 08/22/2011 1205   EOSABS 0.2 01/24/2015 1150   EOSABS 0.1 08/22/2011 1205   BASOSABS 0.0 01/24/2015 1150   BASOSABS 0.1 08/22/2011 1205   Iron/TIBC/Ferritin/ %Sat    Component Value Date/Time   IRON 75 10/16/2013 1158   IRON 31* 12/23/2012 1101   TIBC 265 10/16/2013 1158   TIBC  393 12/23/2012 1101   FERRITIN 366* 01/24/2015 1125   FERRITIN 4* 03/13/2013 1013   IRONPCTSAT 28 10/16/2013 1158   IRONPCTSAT 8* 12/23/2012 1101   IRONPCTSAT 3* 08/22/2011 2158      Assessment & Plan:   80 year old female with a past medical history of iron deficiency anemia, hiatal hernia, duodenal ulcers, colonic  angiodysplasias who is seen in follow-up.  1. IDA/hx of duodenal ulcer/colonic angiodysplasia status post treatment -- fortunately no malignancy was found after panendoscopy. Duodenal ulcers treated with PPI. Would continue once daily PPI. For now recommendation is for close monitoring of iron and IV replacement as needed. She will continue to follow with hematology for this issue. No plans for further endoscopy at this time, nor do symptoms warrant repeat endoscopy

## 2015-02-14 NOTE — Patient Instructions (Signed)
Continue Prilosec 40 mg daily. Follow up in 6 months.

## 2015-02-15 ENCOUNTER — Telehealth: Payer: Self-pay | Admitting: Hematology and Oncology

## 2015-02-15 NOTE — Telephone Encounter (Signed)
Per ROI to mail pts medical records to the pt.

## 2015-02-18 ENCOUNTER — Other Ambulatory Visit: Payer: Self-pay | Admitting: Internal Medicine

## 2015-03-26 ENCOUNTER — Telehealth: Payer: Self-pay | Admitting: *Deleted

## 2015-03-26 NOTE — Telephone Encounter (Signed)
PT. WAS GIVEN PREDNISONE AND ANOTHER MEDICATION. PT. WAS TOLD AT THE URGENT CARE THAT SHE SHOULD NOT HAVE AN IRON INFUSION WHILE TAKING THIS OTHER MEDICATION. PT. WILL BE COMING TO HER 04/09/15 LAB AND MD APPOINTMENT. LEFT MESSAGE FOR PT. TO RETURN A CALL TO OBTAIN THE NAME OF THE OTHER MEDICATION PT. IS TAKING.

## 2015-04-01 ENCOUNTER — Other Ambulatory Visit: Payer: Self-pay | Admitting: Hematology and Oncology

## 2015-04-01 ENCOUNTER — Telehealth: Payer: Self-pay | Admitting: *Deleted

## 2015-04-01 ENCOUNTER — Telehealth: Payer: Self-pay

## 2015-04-01 DIAGNOSIS — D509 Iron deficiency anemia, unspecified: Secondary | ICD-10-CM

## 2015-04-01 NOTE — Telephone Encounter (Signed)
Labs ordered I do not treat tick bites. My understanding is doxycycline is the best choice for tick bites

## 2015-04-01 NOTE — Telephone Encounter (Signed)
Pt had tick bites the doctor ordered prednisone and doxycycline. Pt did not take the doxycycline. She took the course of prednisone and finished it today. She states she is 90% better. She has a lab and md appt on 6/28. There are no lab orders for that day. She is expecting ferritin and cbc, she asked if there is anything you want to order for the tick bite.

## 2015-04-01 NOTE — Telephone Encounter (Signed)
Returning our call of last week. She was out of town.

## 2015-04-09 ENCOUNTER — Other Ambulatory Visit (HOSPITAL_BASED_OUTPATIENT_CLINIC_OR_DEPARTMENT_OTHER): Payer: Medicare Other

## 2015-04-09 ENCOUNTER — Telehealth: Payer: Self-pay | Admitting: *Deleted

## 2015-04-09 ENCOUNTER — Ambulatory Visit (HOSPITAL_BASED_OUTPATIENT_CLINIC_OR_DEPARTMENT_OTHER): Payer: Medicare Other | Admitting: Hematology and Oncology

## 2015-04-09 ENCOUNTER — Telehealth: Payer: Self-pay | Admitting: Hematology and Oncology

## 2015-04-09 ENCOUNTER — Encounter: Payer: Self-pay | Admitting: Hematology and Oncology

## 2015-04-09 ENCOUNTER — Other Ambulatory Visit: Payer: Self-pay | Admitting: Hematology and Oncology

## 2015-04-09 VITALS — BP 143/72 | HR 68 | Temp 98.5°F | Resp 18 | Ht 62.0 in | Wt 189.8 lb

## 2015-04-09 DIAGNOSIS — W57XXXS Bitten or stung by nonvenomous insect and other nonvenomous arthropods, sequela: Secondary | ICD-10-CM | POA: Diagnosis not present

## 2015-04-09 DIAGNOSIS — D509 Iron deficiency anemia, unspecified: Secondary | ICD-10-CM | POA: Diagnosis present

## 2015-04-09 DIAGNOSIS — S40862S Insect bite (nonvenomous) of left upper arm, sequela: Secondary | ICD-10-CM | POA: Diagnosis not present

## 2015-04-09 LAB — CBC & DIFF AND RETIC
BASO%: 0.2 % (ref 0.0–2.0)
BASOS ABS: 0 10*3/uL (ref 0.0–0.1)
EOS%: 1.8 % (ref 0.0–7.0)
Eosinophils Absolute: 0.2 10*3/uL (ref 0.0–0.5)
HEMATOCRIT: 43.9 % (ref 34.8–46.6)
HEMOGLOBIN: 14.4 g/dL (ref 11.6–15.9)
Immature Retic Fract: 1.1 % — ABNORMAL LOW (ref 1.60–10.00)
LYMPH%: 15.9 % (ref 14.0–49.7)
MCH: 28.1 pg (ref 25.1–34.0)
MCHC: 32.8 g/dL (ref 31.5–36.0)
MCV: 85.7 fL (ref 79.5–101.0)
MONO#: 0.5 10*3/uL (ref 0.1–0.9)
MONO%: 6.5 % (ref 0.0–14.0)
NEUT#: 6.2 10*3/uL (ref 1.5–6.5)
NEUT%: 75.6 % (ref 38.4–76.8)
PLATELETS: 260 10*3/uL (ref 145–400)
RBC: 5.12 10*6/uL (ref 3.70–5.45)
RDW: 17.3 % — ABNORMAL HIGH (ref 11.2–14.5)
Retic %: 0.81 % (ref 0.70–2.10)
Retic Ct Abs: 41.47 10*3/uL (ref 33.70–90.70)
WBC: 8.2 10*3/uL (ref 3.9–10.3)
lymph#: 1.3 10*3/uL (ref 0.9–3.3)

## 2015-04-09 LAB — IRON AND TIBC CHCC
%SAT: 38 % (ref 21–57)
IRON: 116 ug/dL (ref 41–142)
TIBC: 305 ug/dL (ref 236–444)
UIBC: 189 ug/dL (ref 120–384)

## 2015-04-09 LAB — HOLD TUBE, BLOOD BANK

## 2015-04-09 LAB — FERRITIN CHCC: FERRITIN: 69 ng/mL (ref 9–269)

## 2015-04-09 NOTE — Telephone Encounter (Signed)
Left VM for pt to return nurse's call.  

## 2015-04-09 NOTE — Telephone Encounter (Signed)
s.w. pt and advised on 7.8 appt.....pt ok and aware °

## 2015-04-09 NOTE — Telephone Encounter (Signed)
Gave adn printed appt sched and avs for pt for Aug OCT Dec

## 2015-04-09 NOTE — Telephone Encounter (Signed)
Pt requests Feraheme on Friday July 7th between 9 and 10 am.   Her daughter will be in town that day and can bring her in the morning but she has another MD appt at 12:15 pm same day.  I sent POF.

## 2015-04-09 NOTE — Telephone Encounter (Signed)
-----   Message from Artis DelayNi Gorsuch, MD sent at 04/09/2015 12:25 PM EDT ----- Regarding: iv iron Ferritin came back low, less than 100 She needs IV iron X 1 dose. Please call her whether she wants this Friday or next Friday Orders are signed ----- Message -----    From: [facility] Interface    Sent: 04/09/2015  10:48 AM      To: Artis DelayNi Gorsuch, MD

## 2015-04-10 DIAGNOSIS — S40862A Insect bite (nonvenomous) of left upper arm, initial encounter: Secondary | ICD-10-CM | POA: Insufficient documentation

## 2015-04-10 DIAGNOSIS — W57XXXA Bitten or stung by nonvenomous insect and other nonvenomous arthropods, initial encounter: Secondary | ICD-10-CM

## 2015-04-10 NOTE — Assessment & Plan Note (Signed)
The most likely cause of her anemia is due to chronic blood loss/malabsorption syndrome. She had extensive investigations by gastroenterologist. We discussed some of the risks, benefits, and alternatives of intravenous iron infusions. The patient is symptomatic from anemia and the iron level is critically low. She tolerated oral iron supplement poorly and desires to achieved higher levels of iron faster for adequate hematopoesis. Some of the side-effects to be expected including risks of infusion reactions, phlebitis, headaches, nausea and fatigue.  The patient is willing to proceed. Patient education material was dispensed.  Goal is to keep ferritin level greater than 100

## 2015-04-10 NOTE — Assessment & Plan Note (Signed)
She had recent tick bite and went to urgent care. She was started on steroids and doxycycline. The patient does not want to take doxycycline because of the side effects that she read. She is asking for advice. I told the patient I am not familiar of treatment for tick bite and recommend she follows instructions from the urgent care physician. If she is not satisfied, we can also refer her to see infectious disease for further recommendation.

## 2015-04-10 NOTE — Progress Notes (Signed)
[facility] OFFICE PROGRESS NOTE  Melanie TOM, MD SUMMARY OF HEMATOLOGIC HISTORY: The patient has been complaining of fatigue and was found to have severe iron deficiency anemia. Approximately a year or 2 ago, she received 2 units of blood transfusion because of this. She had extensive GI evaluation with EGD and colonoscopy and was never found to have any sort of GI bleed. She has received 4 intravenous doses of iron infusion and the last infusion in 3 months ago. Bone marrow aspirate and biopsy in January 2013 showed no evidence of malignancy or persistent absence of iron stores. In 2014, she received 6 doses of intravenous iron infusion. She was referred to gastroenterology for further evaluation due to suspected GI bleed. Repeat GI endoscopy dated 12/26/2013 showed angiodysplastic lesion and moderate diverticulosis. The patient had received numerous intravenous iron infusion almost on a monthly basis from 03/15/2013 to 01/17/15. INTERVAL HISTORY: Melanie [name] 80 y.o. female returns for further follow-up. She had recent tick bite and went to urgent care for evaluation. She complained of mild fatigue. The patient denies any recent signs or symptoms of bleeding such as spontaneous epistaxis, hematuria or hematochezia.   I have reviewed the past medical history, past surgical history, social history and family history with the patient and they are unchanged from previous note.  ALLERGIES:  is allergic to cephalexin; codeine; levofloxacin; morphine and related; other; quinolones; sulfa antibiotics; tramadol; amoxicillin; fexofenadine hcl; floxin; neurontin; and septra.  MEDICATIONS:  Current Outpatient Prescriptions  Medication Sig Dispense Refill  . aspirin 81 MG tablet Take 81 mg by mouth daily.     . Cyanocobalamin (VITAMIN B12 PO) Take 1,500 mcg by mouth daily.    Marland Kitchen docusate sodium (COLACE) 100 MG capsule Take 200 mg by mouth daily before breakfast.     . fluticasone  (FLONASE) 50 MCG/ACT nasal spray Place 1 spray into both nostrils daily.    . folic acid (FOLVITE) 1 MG tablet TAKE 1 TABLET (1 MG TOTAL) BY MOUTH DAILY. 90 tablet 3  . Magnesium 400 MG CAPS Take 400 mg by mouth daily.    . Menthol-Methyl Salicylate (MUSCLE RUB) 10-15 % CREA Apply 1 application topically as needed for muscle pain (apply to leg as needed).    . metoprolol (LOPRESSOR) 50 MG tablet TAKE 1 TABLET BY MOUTH TWICE A DAY 60 tablet 11  . metoprolol (LOPRESSOR) 50 MG tablet TAKE 1 TABLET BY MOUTH TWICE A DAY 60 tablet 11  . omeprazole (PRILOSEC) 40 MG capsule TAKE ONE CAPSULE BY MOUTH EVERY DAY 30 capsule 3  . polyethylene glycol (MIRALAX / GLYCOLAX) packet Take 17 g by mouth daily.     No current facility-administered medications for this visit.     REVIEW OF SYSTEMS:   Constitutional: Denies fevers, chills or night sweats Eyes: Denies blurriness of vision Ears, nose, mouth, throat, and face: Denies mucositis or sore throat Respiratory: Denies cough, dyspnea or wheezes Cardiovascular: Denies palpitation, chest discomfort or lower extremity swelling Gastrointestinal:  Denies nausea, heartburn or change in bowel habits Skin: Denies abnormal skin rashes Lymphatics: Denies new lymphadenopathy or easy bruising Neurological:Denies numbness, tingling or new weaknesses Behavioral/Psych: Mood is stable, no new changes  All other systems were reviewed with the patient and are negative.  PHYSICAL EXAMINATION: ECOG PERFORMANCE STATUS: 0 - Asymptomatic  Filed Vitals:   04/09/15 1046  BP: 143/72  Pulse: 68  Temp: 98.5 F (36.9 C)  Resp: 18   Filed Weights   04/09/15 1046  Weight:  189 lb 12.8 oz (86.093 kg)    GENERAL:alert, no distress and comfortable SKIN: skin color, texture, turgor are normal, no rashes or significant lesions. She had very mild faint skin rash on her left arm EYES: normal, Conjunctiva are pink and non-injected, sclera clear OROPHARYNX:no exudate, no erythema  and lips, buccal mucosa, and tongue normal  Musculoskeletal:no cyanosis of digits and no clubbing  NEURO: alert & oriented x 3 with fluent speech, no focal motor/sensory deficits  LABORATORY DATA:  I have reviewed the data as listed Results for orders placed or performed in visit on 04/09/15 (from the past 48 hour(s))  Hold Tube, Blood Bank     Status: None   Collection Time: 04/09/15 10:29 AM  Result Value Ref Range   Hold Tube, Blood Bank Blood Bank Order Cancelled   CBC & Diff and Retic     Status: Abnormal   Collection Time: 04/09/15 10:29 AM  Result Value Ref Range   WBC 8.2 3.9 - 10.3 10e3/uL   NEUT# 6.2 1.5 - 6.5 10e3/uL   HGB 14.4 11.6 - 15.9 g/dL   HCT 16.1 09.6 - 04.5 %   Platelets 260 145 - 400 10e3/uL   MCV 85.7 79.5 - 101.0 fL   MCH 28.1 25.1 - 34.0 pg   MCHC 32.8 31.5 - 36.0 g/dL   RBC 4.09 8.11 - 9.14 10e6/uL   RDW 17.3 (H) 11.2 - 14.5 %   lymph# 1.3 0.9 - 3.3 10e3/uL   MONO# 0.5 0.1 - 0.9 10e3/uL   Eosinophils Absolute 0.2 0.0 - 0.5 10e3/uL   Basophils Absolute 0.0 0.0 - 0.1 10e3/uL   NEUT% 75.6 38.4 - 76.8 %   LYMPH% 15.9 14.0 - 49.7 %   MONO% 6.5 0.0 - 14.0 %   EOS% 1.8 0.0 - 7.0 %   BASO% 0.2 0.0 - 2.0 %   Retic % 0.81 0.70 - 2.10 %   Retic Ct Abs 41.47 33.70 - 90.70 10e3/uL   Immature Retic Fract 1.10 (L) 1.60 - 10.00 %  Ferritin     Status: None   Collection Time: 04/09/15 10:29 AM  Result Value Ref Range   Ferritin 69 9 - 269 ng/ml  Iron and TIBC     Status: None   Collection Time: 04/09/15 10:29 AM  Result Value Ref Range   Iron 116 41 - 142 ug/dL   TIBC 782 956 - 213 ug/dL   UIBC 086 578 - 469 ug/dL   %SAT 38 21 - 57 %    Lab Results  Component Value Date   WBC 8.2 04/09/2015   HGB 14.4 04/09/2015   HCT 43.9 04/09/2015   MCV 85.7 04/09/2015   PLT 260 04/09/2015   ASSESSMENT & PLAN:  Iron deficiency anemia The most likely cause of her anemia is due to chronic blood loss/malabsorption syndrome. She had extensive investigations by  gastroenterologist. We discussed some of the risks, benefits, and alternatives of intravenous iron infusions. The patient is symptomatic from anemia and the iron level is critically low. She tolerated oral iron supplement poorly and desires to achieved higher levels of iron faster for adequate hematopoesis. Some of the side-effects to be expected including risks of infusion reactions, phlebitis, headaches, nausea and fatigue.  The patient is willing to proceed. Patient education material was dispensed.  Goal is to keep ferritin level greater than 100   Tick bite of left axillary region She had recent tick bite and went to urgent care. She was started on  steroids and doxycycline. The patient does not want to take doxycycline because of the side effects that she read. She is asking for advice. I told the patient I am not familiar of treatment for tick bite and recommend she follows instructions from the urgent care physician. If she is not satisfied, we can also refer her to see infectious disease for further recommendation.   All questions were answered. The patient knows to call the clinic with any problems, questions or concerns. No barriers to learning was detected.  I spent 15 minutes counseling the patient face to face. The total time spent in the appointment was 20 minutes and more than 50% was on counseling.     [facility] CenterGORSUCH, Elayna Tobler, MD 6/29/20169:42 AM

## 2015-04-19 ENCOUNTER — Encounter: Payer: Self-pay | Admitting: Family Medicine

## 2015-04-19 ENCOUNTER — Ambulatory Visit (INDEPENDENT_AMBULATORY_CARE_PROVIDER_SITE_OTHER): Payer: Medicare Other | Admitting: Family Medicine

## 2015-04-19 ENCOUNTER — Ambulatory Visit (HOSPITAL_BASED_OUTPATIENT_CLINIC_OR_DEPARTMENT_OTHER): Payer: Medicare Other

## 2015-04-19 VITALS — BP 118/66 | HR 64 | Temp 98.2°F | Resp 18 | Ht 62.0 in | Wt 195.0 lb

## 2015-04-19 VITALS — BP 151/78 | HR 64 | Temp 98.3°F | Resp 18

## 2015-04-19 DIAGNOSIS — W57XXXA Bitten or stung by nonvenomous insect and other nonvenomous arthropods, initial encounter: Secondary | ICD-10-CM

## 2015-04-19 DIAGNOSIS — R062 Wheezing: Secondary | ICD-10-CM

## 2015-04-19 DIAGNOSIS — T148 Other injury of unspecified body region: Secondary | ICD-10-CM

## 2015-04-19 DIAGNOSIS — D509 Iron deficiency anemia, unspecified: Secondary | ICD-10-CM | POA: Diagnosis present

## 2015-04-19 MED ORDER — ALBUTEROL SULFATE HFA 108 (90 BASE) MCG/ACT IN AERS: 2.0000 | INHALATION_SPRAY | Freq: Four times a day (QID) | RESPIRATORY_TRACT | Status: DC | PRN

## 2015-04-19 MED ORDER — DIPHENHYDRAMINE HCL 25 MG PO CAPS
ORAL_CAPSULE | ORAL | Status: AC
  Filled 2015-04-19: qty 1

## 2015-04-19 MED ORDER — SODIUM CHLORIDE 0.9 % IV SOLN
Freq: Once | INTRAVENOUS | Status: AC
  Administered 2015-04-19: 09:00:00 via INTRAVENOUS

## 2015-04-19 MED ORDER — DIPHENHYDRAMINE HCL 25 MG PO CAPS
25.0000 mg | ORAL_CAPSULE | Freq: Once | ORAL | Status: AC
  Administered 2015-04-19: 25 mg via ORAL

## 2015-04-19 MED ORDER — ACETAMINOPHEN 325 MG PO TABS
650.0000 mg | ORAL_TABLET | Freq: Once | ORAL | Status: AC
  Administered 2015-04-19: 650 mg via ORAL

## 2015-04-19 MED ORDER — ACETAMINOPHEN 325 MG PO TABS
ORAL_TABLET | ORAL | Status: AC
  Filled 2015-04-19: qty 2

## 2015-04-19 MED ORDER — SODIUM CHLORIDE 0.9 % IV SOLN
510.0000 mg | Freq: Once | INTRAVENOUS | Status: AC
  Administered 2015-04-19: 510 mg via INTRAVENOUS
  Filled 2015-04-19: qty 17

## 2015-04-19 NOTE — Progress Notes (Signed)
Subjective:    Patient ID: Melanie [name], female    DOB: [birth year], 80 y.o.   MRN: [id number]  HPI  One month ago, the patient was bitten by tick on her medial left upper arm. There was no visible rash. She denies any fevers or chills or headaches or neck stiffness. She denies any myalgias or arthralgias. Still she is requesting that we check for Lyme disease and documented spotted fever. She also reports episodic wheezing. Please see my office visit from December. Patient never discontinue metoprolol. Patient states the wheezing is infrequent but it is associated with shortness of breath. She does have a family history of a daughter with asthma. Past Medical History  Diagnosis Date  . Hypertension   . Allergy   . Hiatal hernia   . Anemia   . Cataract   . Diverticula of colon   . Arthritis     right hip is most painful and affects mobility as far as ambulating steps  . Iron deficiency anemia, unspecified 12/25/2012  . Hypercalcemia 08/18/2013  . Complication of anesthesia     after gallbladder surgery, had memory issues 2 days after for several days  . Hearing loss   . AVM (arteriovenous malformation)     of cecum  . Duodenal ulcer 2015    x 4 nonbleeding  . Duodenitis    Past Surgical History  Procedure Laterality Date  . Tonsillectomy    . Cataract extraction Bilateral     2008  . Cholecystectomy      laparoscopic   . Esophagogastroduodenoscopy N/A 12/26/2013    Procedure: ESOPHAGOGASTRODUODENOSCOPY (EGD);  Surgeon: Beverley FiedlerJay M Pyrtle, MD;  [facility];  Service: Gastroenterology;  Laterality: N/A;  . Colonoscopy N/A 12/26/2013    Procedure: COLONOSCOPY;  Surgeon: Beverley FiedlerJay M Pyrtle, MD;  Location: [facility];  Service: Gastroenterology;  Laterality: N/A;  . Hot hemostasis N/A 12/26/2013    Procedure: HOT HEMOSTASIS (ARGON PLASMA COAGULATION/BICAP);  Surgeon: Beverley FiedlerJay M Pyrtle, MD;  [facility];  Service: Gastroenterology;  Laterality: N/A;  . Givens capsule study  N/A 12/26/2013    Procedure: GIVENS CAPSULE STUDY;  Surgeon: Beverley FiedlerJay M Pyrtle, MD;  Location: [facility];  Service: Gastroenterology;  Laterality: N/A;  Due to inablility to swallow capsule, will place Endoscopically into Small Bowel.   Current Outpatient Prescriptions on File Prior to Visit  Medication Sig Dispense Refill  . aspirin 81 MG tablet Take 81 mg by mouth daily.     . Cyanocobalamin (VITAMIN B12 PO) Take 1,500 mcg by mouth daily.    Marland Kitchen. docusate sodium (COLACE) 100 MG capsule Take 200 mg by mouth daily before breakfast.     . fluticasone (FLONASE) 50 MCG/ACT nasal spray Place 1 spray into both nostrils daily.    . folic acid (FOLVITE) 1 MG tablet TAKE 1 TABLET (1 MG TOTAL) BY MOUTH DAILY. 90 tablet 3  . Magnesium 400 MG CAPS Take 400 mg by mouth daily.    . Menthol-Methyl Salicylate (MUSCLE RUB) 10-15 % CREA Apply 1 application topically as needed for muscle pain (apply to leg as needed).    . metoprolol (LOPRESSOR) 50 MG tablet TAKE 1 TABLET BY MOUTH TWICE A DAY 60 tablet 11  . omeprazole (PRILOSEC) 40 MG capsule TAKE ONE CAPSULE BY MOUTH EVERY DAY 30 capsule 3  . polyethylene glycol (MIRALAX / GLYCOLAX) packet Take 17 g by mouth daily.     No current facility-administered medications on file prior to visit.   Allergies  Allergen Reactions  . Cephalexin Other (See Comments)    Affected pt's child -  Therefore , pt does not take Keflex.  . Clindamycin/Lincomycin   . Codeine Nausea And Vomiting  . Levofloxacin Other (See Comments)    Hallucinations.  . Morphine And Related Nausea And Vomiting  . Other     Another antibiotic that starts with cina?  . Quinolones Other (See Comments)    Hallucinations.  . Sulfa Antibiotics Nausea And Vomiting  . Tramadol Nausea And Vomiting  . Amoxicillin Nausea Only  . Fexofenadine Hcl Other (See Comments)    unknown reaction  . Floxin [Ocuflox] Other (See Comments)    unknown allergy  . Neurontin [Gabapentin] Palpitations  . Septra  [Bactrim] Other (See Comments)    Unknown reaction   History   Social History  . Marital Status: Married    Spouse Name: N/A  . Number of Children: 3  . Years of Education: N/A   Occupational History  . Retired    Social History Main Topics  . Smoking status: Never Smoker   . Smokeless tobacco: Never Used  . Alcohol Use: No  . Drug Use: No  . Sexual Activity: Not Currently   Other Topics Concern  . Not on file   Social History Narrative     Review of Systems  All other systems reviewed and are negative.      Objective:   Physical Exam  Cardiovascular: Normal rate, regular rhythm and normal heart sounds.   Pulmonary/Chest: Effort normal and breath sounds normal. No respiratory distress. She has no wheezes. She has no rales.  Skin: No rash noted.  Vitals reviewed.         Assessment & Plan:  Tick bite - Plan: B. burgdorfi antibodies by WB, [place] spotted fvr abs pnl(IgG+IgM)  Wheezing - Plan: albuterol (PROVENTIL HFA;VENTOLIN HFA) 108 (90 BASE) MCG/ACT inhaler  I explained to the patient that I do not believe she has any symptoms or signs of [place] spotted fever or Lyme disease. However she would like me to check titers for this. I will check the titers today. I did give the patient albuterol 2 puffs inhaled every 6 hours as needed for wheezing. I advised the patient uses sparingly. Recheck if no better

## 2015-04-19 NOTE — Patient Instructions (Signed)

## 2015-04-22 LAB — B. BURGDORFI ANTIBODIES BY WB
B BURGDORFERI IGM ABS (IB): NEGATIVE
B burgdorferi IgG Abs (IB): NEGATIVE

## 2015-04-22 LAB — ROCKY MTN SPOTTED FVR ABS PNL(IGG+IGM)
RMSF IGM: 0.16 IV
RMSF IgG: 0.09 IV

## 2015-05-15 ENCOUNTER — Telehealth: Payer: Self-pay | Admitting: Hematology and Oncology

## 2015-05-15 NOTE — Telephone Encounter (Signed)
returned call and s.w. pt and confirmed appt all on one day.Marland KitchenMarland KitchenMarland KitchenMarland Kitchenpt ok and aware

## 2015-05-15 NOTE — Telephone Encounter (Signed)
returned call and lvm for pt confirm DEC appts...Marland KitchenMarland Kitchen

## 2015-06-04 ENCOUNTER — Encounter: Payer: Self-pay | Admitting: Internal Medicine

## 2015-06-10 ENCOUNTER — Telehealth: Payer: Self-pay | Admitting: *Deleted

## 2015-06-10 ENCOUNTER — Other Ambulatory Visit (HOSPITAL_BASED_OUTPATIENT_CLINIC_OR_DEPARTMENT_OTHER): Payer: Medicare Other

## 2015-06-10 DIAGNOSIS — D509 Iron deficiency anemia, unspecified: Secondary | ICD-10-CM

## 2015-06-10 LAB — CBC & DIFF AND RETIC
BASO%: 0.6 % (ref 0.0–2.0)
BASOS ABS: 0 10*3/uL (ref 0.0–0.1)
EOS ABS: 0.3 10*3/uL (ref 0.0–0.5)
EOS%: 5.6 % (ref 0.0–7.0)
HCT: 42.7 % (ref 34.8–46.6)
HEMOGLOBIN: 14.1 g/dL (ref 11.6–15.9)
IMMATURE RETIC FRACT: 4.6 % (ref 1.60–10.00)
LYMPH%: 19.2 % (ref 14.0–49.7)
MCH: 29.7 pg (ref 25.1–34.0)
MCHC: 33 g/dL (ref 31.5–36.0)
MCV: 90.1 fL (ref 79.5–101.0)
MONO#: 0.3 10*3/uL (ref 0.1–0.9)
MONO%: 6 % (ref 0.0–14.0)
NEUT%: 68.6 % (ref 38.4–76.8)
NEUTROS ABS: 3.3 10*3/uL (ref 1.5–6.5)
Platelets: 207 10*3/uL (ref 145–400)
RBC: 4.74 10*6/uL (ref 3.70–5.45)
RDW: 14.3 % (ref 11.2–14.5)
RETIC %: 2.12 % — AB (ref 0.70–2.10)
Retic Ct Abs: 100.49 10*3/uL — ABNORMAL HIGH (ref 33.70–90.70)
WBC: 4.8 10*3/uL (ref 3.9–10.3)
lymph#: 0.9 10*3/uL (ref 0.9–3.3)

## 2015-06-10 LAB — FERRITIN CHCC: FERRITIN: 212 ng/mL (ref 9–269)

## 2015-06-10 NOTE — Telephone Encounter (Signed)
-----   Message from [name], MD sent at 06/10/2015 12:34 PM EDT ----- Regarding: test results Pls call her with results ----- Message -----    From: [facility]    Sent: 06/10/2015  10:29 AM      To: [name], MD

## 2015-06-10 NOTE — Telephone Encounter (Signed)
Informed pt of lab results are good today.  Keep lab appt in October as scheduled.  She verbalized understanding.

## 2015-06-14 ENCOUNTER — Other Ambulatory Visit: Payer: Self-pay | Admitting: Internal Medicine

## 2015-06-15 ENCOUNTER — Other Ambulatory Visit: Payer: Self-pay | Admitting: Internal Medicine

## 2015-07-12 ENCOUNTER — Other Ambulatory Visit: Payer: Self-pay

## 2015-07-12 ENCOUNTER — Telehealth: Payer: Self-pay | Admitting: Family Medicine

## 2015-07-12 DIAGNOSIS — Z1231 Encounter for screening mammogram for malignant neoplasm of breast: Secondary | ICD-10-CM

## 2015-07-12 DIAGNOSIS — E2839 Other primary ovarian failure: Secondary | ICD-10-CM

## 2015-07-12 NOTE — Telephone Encounter (Signed)
Pt called and needs [place] to send over a referral for her to have her bone density test done on the same day as her other imaging appt. Her # is [phone]

## 2015-07-15 ENCOUNTER — Other Ambulatory Visit: Payer: Self-pay | Admitting: *Deleted

## 2015-07-15 MED ORDER — METOPROLOL TARTRATE 50 MG PO TABS: 50.0000 mg | ORAL_TABLET | Freq: Two times a day (BID) | ORAL | Status: DC

## 2015-07-15 MED ORDER — OMEPRAZOLE 40 MG PO CPDR: 40.0000 mg | DELAYED_RELEASE_CAPSULE | Freq: Every day | ORAL | Status: DC

## 2015-07-15 NOTE — Telephone Encounter (Signed)
Called pt and she stated that she is needing an order for her to have Bone Density ordered so that it could be scheduled for same day has her Mammo on Dec 12, order has been place to [facility].

## 2015-07-15 NOTE — Telephone Encounter (Signed)
Received fax requesting refill on Toprol with 90 day supply.   Prescription sent to pharmacy.

## 2015-08-05 ENCOUNTER — Other Ambulatory Visit (HOSPITAL_BASED_OUTPATIENT_CLINIC_OR_DEPARTMENT_OTHER): Payer: Medicare Other

## 2015-08-05 DIAGNOSIS — D509 Iron deficiency anemia, unspecified: Secondary | ICD-10-CM | POA: Diagnosis present

## 2015-08-05 LAB — HOLD TUBE, BLOOD BANK

## 2015-08-05 LAB — CBC & DIFF AND RETIC
BASO%: 0.5 % (ref 0.0–2.0)
BASOS ABS: 0 10*3/uL (ref 0.0–0.1)
EOS%: 4.9 % (ref 0.0–7.0)
Eosinophils Absolute: 0.3 10*3/uL (ref 0.0–0.5)
HEMATOCRIT: 44.2 % (ref 34.8–46.6)
HGB: 14.5 g/dL (ref 11.6–15.9)
Immature Retic Fract: 1.9 % (ref 1.60–10.00)
LYMPH%: 18.6 % (ref 14.0–49.7)
MCH: 29.9 pg (ref 25.1–34.0)
MCHC: 32.8 g/dL (ref 31.5–36.0)
MCV: 91.1 fL (ref 79.5–101.0)
MONO#: 0.4 10*3/uL (ref 0.1–0.9)
MONO%: 7 % (ref 0.0–14.0)
NEUT#: 4.2 10*3/uL (ref 1.5–6.5)
NEUT%: 69 % (ref 38.4–76.8)
PLATELETS: 192 10*3/uL (ref 145–400)
RBC: 4.85 10*6/uL (ref 3.70–5.45)
RDW: 13 % (ref 11.2–14.5)
RETIC %: 1.48 % (ref 0.70–2.10)
Retic Ct Abs: 71.78 10*3/uL (ref 33.70–90.70)
WBC: 6.1 10*3/uL (ref 3.9–10.3)
lymph#: 1.1 10*3/uL (ref 0.9–3.3)

## 2015-08-05 LAB — FERRITIN CHCC: Ferritin: 149 ng/ml (ref 9–269)

## 2015-08-06 ENCOUNTER — Telehealth: Payer: Self-pay | Admitting: *Deleted

## 2015-08-06 ENCOUNTER — Telehealth: Payer: Self-pay

## 2015-08-06 NOTE — Telephone Encounter (Signed)
Spoke with patient,let her now that cbc and iron studies are still good.

## 2015-08-06 NOTE — Telephone Encounter (Signed)
-----   Message from Rolanda JayPhonacelle C Windham, RN sent at 08/06/2015  2:33 PM EDT ----- Regarding: FW: labs   ----- Message -----    From: Artis DelayNi Gorsuch, MD    Sent: 08/05/2015  11:49 AM      To: Rolanda JayPhonacelle C Windham, RN Subject: labs                                           Please let her know CBC and iron studies are still good ----- Message -----    From: [facility]    Sent: 08/05/2015  10:51 AM      To: Artis DelayNi Gorsuch, MD

## 2015-08-06 NOTE — Telephone Encounter (Signed)
Pt called requesting lab results from yesterday: results given.

## 2015-08-22 ENCOUNTER — Ambulatory Visit (INDEPENDENT_AMBULATORY_CARE_PROVIDER_SITE_OTHER): Payer: Medicare Other | Admitting: Internal Medicine

## 2015-08-22 ENCOUNTER — Encounter: Payer: Self-pay | Admitting: Internal Medicine

## 2015-08-22 ENCOUNTER — Other Ambulatory Visit (INDEPENDENT_AMBULATORY_CARE_PROVIDER_SITE_OTHER): Payer: Medicare Other

## 2015-08-22 ENCOUNTER — Other Ambulatory Visit: Payer: Self-pay | Admitting: Hematology and Oncology

## 2015-08-22 VITALS — BP 130/80 | HR 82 | Ht 62.0 in | Wt 198.4 lb

## 2015-08-22 DIAGNOSIS — K219 Gastro-esophageal reflux disease without esophagitis: Secondary | ICD-10-CM | POA: Diagnosis not present

## 2015-08-22 DIAGNOSIS — D509 Iron deficiency anemia, unspecified: Secondary | ICD-10-CM | POA: Diagnosis not present

## 2015-08-22 DIAGNOSIS — R1013 Epigastric pain: Secondary | ICD-10-CM

## 2015-08-22 DIAGNOSIS — R1011 Right upper quadrant pain: Secondary | ICD-10-CM

## 2015-08-22 LAB — COMPREHENSIVE METABOLIC PANEL
ALBUMIN: 4.1 g/dL (ref 3.5–5.2)
ALK PHOS: 89 U/L (ref 39–117)
ALT: 16 U/L (ref 0–35)
AST: 17 U/L (ref 0–37)
BILIRUBIN TOTAL: 0.5 mg/dL (ref 0.2–1.2)
BUN: 16 mg/dL (ref 6–23)
CALCIUM: 11.1 mg/dL — AB (ref 8.4–10.5)
CO2: 29 meq/L (ref 19–32)
CREATININE: 0.85 mg/dL (ref 0.40–1.20)
Chloride: 106 mEq/L (ref 96–112)
GFR: 68.68 mL/min (ref >60.00)
Glucose, Bld: 95 mg/dL (ref 70–99)
Potassium: 4.3 mEq/L (ref 3.5–5.1)
Sodium: 141 mEq/L (ref 135–145)
Total Protein: 7.1 g/dL (ref 6.0–8.3)

## 2015-08-22 LAB — LIPASE: LIPASE: 17 U/L (ref 11.0–59.0)

## 2015-08-22 MED ORDER — SUCRALFATE 1 GM/10ML PO SUSP: 1.0000 g | Freq: Three times a day (TID) | ORAL | Status: DC

## 2015-08-22 NOTE — Patient Instructions (Signed)
Your physician has requested that you go to the basement for the following lab work before leaving today:CMP, Lipase  We have sent the following medications to your pharmacy for you to pick up at your convenience: Carafate three times daily before meals and at bedtime x 1 week  Continue omeprazole and miralax.  Please follow up with hematology regarding anemia.  Call our office in 1 week to let us know how you are doing. If you are no better, we may consider doing a CT scan.

## 2015-08-22 NOTE — Progress Notes (Signed)
Subjective:    Patient ID: Melanie [name], female    DOB: December 01, 1934, 80 y.o.   MRN: [id number]  HPI Melanie [name] is a 80 year old female with history of iron deficiency anemia, hiatal hernia, duodenal ulcers, colonic angiodysplasia is here for follow-up. She was last seen in May 2016. She has been seen by Dr. Bertis Ruddy with hematology and underwent IV iron infusions. She has had normalization of hemoglobin and her iron studies have improved dramatically. She reports on the whole she has been feeling well and eating well. She's had right upper quadrant pain which she notices with movement. This is more noticeable when she bends over or turns or twists. This does not seem to relate to eating. Bowel movements a been regular without constipation or diarrhea. She denies heartburn. She has had issues with her teeth fracturing and is seeing her dentist Dr. Hyacinth Meeker. She wonders if the IV iron infusions has caused her dental problems. She has taken omeprazole 40 mg daily and using MiraLAX 17 g daily. She is status post cholecystectomy. He denies dysphagia, odynophagia. Denies fever or chills. Denies trauma  She had previously undergone upper endoscopy, colonoscopy and video capsule endoscopy. Colonoscopy revealed angiodysplastic lesion in the ascending colon treated with APC. Moderate sigmoid diverticulosis. Upper endoscopy showed hiatal hernia without Cameron's erosion, small nonbleeding duodenal bulb ulcers. Biopsies were negative for H. Pylori.  Review of Systems As per history of present illness, otherwise negative  Current Medications, Allergies, Past Medical History, Past Surgical History, Family History and Social History were reviewed in [employer] record.     Objective:   Physical Exam BP 130/80 mmHg  Pulse 82  Ht  (1.575 m)  Wt 198 lb 6.4 oz (89.994 kg)  BMI 36.28 kg/m2 Constitutional: Well-developed and well-nourished. No distress. HEENT: Normocephalic and  atraumatic. Oropharynx is clear and moist. Poor dentition. No oropharyngeal exudate. Conjunctivae are normal.  No scleral icterus. Neck: Neck supple. Trachea midline. Cardiovascular: Normal rate, regular rhythm and intact distal pulses. No M/R/G Pulmonary/chest: Effort normal and breath sounds normal. No wheezing, rales or rhonchi. Abdominal: Soft, nontender, nondistended. Bowel sounds active throughout. There are no masses palpable. Carnett's neg Extremities: no clubbing, cyanosis, or edema Lymphadenopathy: No cervical adenopathy noted. Neurological: Alert and oriented to person place and time. Skin: Skin is warm and dry. No rashes noted. Psychiatric: Normal mood and affect. Behavior is normal.   CBC    Component Value Date/Time   WBC 6.1 08/05/2015 1025   WBC 5.3 11/12/2011 0745   RBC 4.85 08/05/2015 1025   RBC 3.32* 01/10/2015 1150   RBC 4.52 11/12/2011 0745   HGB 14.5 08/05/2015 1025   HGB 10.1* 11/12/2011 0745   HCT 44.2 08/05/2015 1025   HCT 33.1* 11/12/2011 0745   PLT 192 08/05/2015 1025   PLT 350 11/12/2011 0745   MCV 91.1 08/05/2015 1025   MCV 73.2* 11/12/2011 0745   MCH 29.9 08/05/2015 1025   MCH 22.3* 11/12/2011 0745   MCHC 32.8 08/05/2015 1025   MCHC 30.5 11/12/2011 0745   RDW 13.0 08/05/2015 1025   RDW 20.4* 11/12/2011 0745   LYMPHSABS 1.1 08/05/2015 1025   LYMPHSABS 0.9 08/22/2011 1205   MONOABS 0.4 08/05/2015 1025   MONOABS 0.5 08/22/2011 1205   EOSABS 0.3 08/05/2015 1025   EOSABS 0.1 08/22/2011 1205   BASOSABS 0.0 08/05/2015 1025   BASOSABS 0.1 08/22/2011 1205    Iron/TIBC/Ferritin/ %Sat    Component Value Date/Time   IRON 116 04/09/2015 1029  IRON 31* 12/23/2012 1101   TIBC 305 04/09/2015 1029   TIBC 393 12/23/2012 1101   FERRITIN 149 08/05/2015 1025   FERRITIN 4* 03/13/2013 1013   IRONPCTSAT 38 04/09/2015 1029   IRONPCTSAT 8* 12/23/2012 1101   IRONPCTSAT 3* 08/22/2011 2158        Assessment & Plan:  80 year old female with history of  iron deficiency anemia, hiatal hernia, duodenal ulcers, colonic angiodysplasia is here for follow-up.  1. IDA -- after extensive GI workup she has had the need for IV iron. Her duodenal ulcer has been treated and her colonic angiodysplasia ablated. She will continue to follow with hematology and received IV iron on an as-needed basis. Hemoglobin and iron studies recently normal  2. Right upper quadrant pain -- unclear etiology. Sounds mostly musculoskeletal to me. She does have a sizable hiatal hernia and I cannot exclude this as a source of her upper abdominal pain. We'll give her a trial of Carafate 1 g liquid before meals and at bedtime 1 week. If no better consider CT scan abdomen and pelvis. Check CMP and lipase today  3. GERD -- continue omeprazole. Adding Carafate as above  25 minutes spent with the patient today 6-12 month follow-up

## 2015-08-23 ENCOUNTER — Telehealth: Payer: Self-pay | Admitting: Internal Medicine

## 2015-08-23 MED ORDER — SUCRALFATE 1 G PO TABS: 1.0000 g | ORAL_TABLET | Freq: Three times a day (TID) | ORAL | Status: DC

## 2015-08-23 NOTE — Telephone Encounter (Signed)
I have spoken to pharmacist. Carafate tablets would cost $19.99 rather than the liquid that would be $146. I have changed patient's rx to the tablet form and patient can make into a slurry. Pharmacist states they will show patient how to do this. Patient is advised of this.

## 2015-08-30 ENCOUNTER — Telehealth: Payer: Self-pay | Admitting: Internal Medicine

## 2015-08-30 NOTE — Telephone Encounter (Signed)
Pt states she stopped taking the carafate because it was making her urine an orange color. Pt states she stopped the carafate and now her urine is back to its regular color. Pt wanted Dr. Rhea BeltonPyrtle to know. Dr. Rhea BeltonPyrtle notified.

## 2015-09-20 ENCOUNTER — Emergency Department (INDEPENDENT_AMBULATORY_CARE_PROVIDER_SITE_OTHER)
Admission: EM | Admit: 2015-09-20 | Discharge: 2015-09-20 | Disposition: A | Discharge status: Home / Self Care | Payer: Medicare Other | Source: Home / Self Care | Attending: Family Medicine | Admitting: Family Medicine

## 2015-09-20 ENCOUNTER — Encounter (HOSPITAL_COMMUNITY): Payer: Self-pay | Admitting: Emergency Medicine

## 2015-09-20 DIAGNOSIS — R519 Headache, unspecified: Secondary | ICD-10-CM

## 2015-09-20 DIAGNOSIS — K0889 Other specified disorders of teeth and supporting structures: Secondary | ICD-10-CM

## 2015-09-20 DIAGNOSIS — R030 Elevated blood-pressure reading, without diagnosis of hypertension: Secondary | ICD-10-CM | POA: Diagnosis not present

## 2015-09-20 DIAGNOSIS — R51 Headache: Secondary | ICD-10-CM | POA: Diagnosis not present

## 2015-09-20 DIAGNOSIS — IMO0001 Reserved for inherently not codable concepts without codable children: Secondary | ICD-10-CM

## 2015-09-20 NOTE — ED Notes (Signed)
Pt reports her BP has been elevated; not sure if it's due to dental pain Also having mild HA; 3/10 A&O x4... No acute distress.

## 2015-09-20 NOTE — ED Provider Notes (Signed)
CSN: [id number]     Arrival date & time 09/20/15  1354 History   First MD Initiated Contact with Patient 09/20/15 1447     Chief Complaint  Patient presents with  . Hypertension   (Consider location/radiation/quality/duration/timing/severity/associated sxs/prior Treatment) HPI Comments: 80 year old female presents to the urgent care with concern for her blood pressure. She notes twice that at her dentist her blood pressure was 152 systolic. And today it is 144/78. She is concerned calls she has throbbing in her teeth. She has known dental disease and dental pain. She states that she is under the impression that if she has throbbing in her teeth it is due to a problem with high blood pressure. She states that she has not been able to find a primary care doctor since she moved to [place] 9 years ago. She is also complaining of her eyeglasses not correcting her vision while reading and this produces strain on her eyes. She last saw her eye doctor in November, she did not specify whether this was last month or last year. She complains of a mild headache. Denies problems with diplopia speech, hearing or swallowing. Denies focal paresthesias or weakness. Denies problems with confusion, memory or orientation.  Patient is a 80 y.o. female presenting with hypertension.  Hypertension Associated symptoms include headaches. Pertinent negatives include no chest pain and no shortness of breath.    Past Medical History  Diagnosis Date  . Hypertension   . Allergy   . Hiatal hernia   . Anemia   . Cataract   . Diverticula of colon   . Arthritis     right hip is most painful and affects mobility as far as ambulating steps  . Iron deficiency anemia, unspecified 12/25/2012  . Hypercalcemia 08/18/2013  . Complication of anesthesia     after gallbladder surgery, had memory issues 2 days after for several days  . Hearing loss   . AVM (arteriovenous malformation)     of cecum  . Duodenal ulcer 2015    x 4  nonbleeding  . Duodenitis    Past Surgical History  Procedure Laterality Date  . Tonsillectomy    . Cataract extraction Bilateral     2008  . Cholecystectomy      laparoscopic   . Esophagogastroduodenoscopy N/A 12/26/2013    Procedure: ESOPHAGOGASTRODUODENOSCOPY (EGD);  Surgeon: Beverley Fiedler, MD;  Location: [facility];  Service: Gastroenterology;  Laterality: N/A;  . Colonoscopy N/A 12/26/2013    Procedure: COLONOSCOPY;  Surgeon: Beverley Fiedler, MD;  Location: [facility];  Service: Gastroenterology;  Laterality: N/A;  . Hot hemostasis N/A 12/26/2013    Procedure: HOT HEMOSTASIS (ARGON PLASMA COAGULATION/BICAP);  Surgeon: Beverley Fiedler, MD;  Location: [facility];  Service: Gastroenterology;  Laterality: N/A;  . Givens capsule study N/A 12/26/2013    Procedure: GIVENS CAPSULE STUDY;  Surgeon: Beverley Fiedler, MD;  Location: [facility];  Service: Gastroenterology;  Laterality: N/A;  Due to inablility to swallow capsule, will place Endoscopically into Small Bowel.   Family History  Problem Relation Age of [place]  . Asthma Other   . Hypertension Other   . Hyperlipidemia Other   . Stroke Other   . Diabetes Mother   . Heart disease Mother   . Heart disease Father   . Diverticulosis Mother    Social History  Substance Use Topics  . Smoking status: Never Smoker   . Smokeless tobacco: Never Used  . Alcohol Use: No   OB History  No data available     Review of Systems  Constitutional: Negative for fever, activity change and fatigue.  HENT: Positive for dental problem. Negative for ear pain, postnasal drip and rhinorrhea.   Eyes: Negative for photophobia, discharge and redness.  Respiratory: Negative for cough, shortness of breath and wheezing.   Cardiovascular: Negative for chest pain, palpitations and leg swelling.  Gastrointestinal: Negative.   Genitourinary: Negative.   Musculoskeletal: Negative.   Skin: Negative.   Neurological: Positive for headaches. Negative for tremors,  syncope, facial asymmetry, speech difficulty, weakness and numbness.    Allergies  Cephalexin; Clindamycin/lincomycin; Codeine; Levofloxacin; Morphine and related; Other; Quinolones; Sulfa antibiotics; Tramadol; Amoxicillin; Fexofenadine hcl; Floxin; Neurontin; and Septra  Home Medications   Prior to Admission medications   Medication Sig Start Date End Date Taking? Authorizing Provider  aspirin 81 MG tablet Take 81 mg by mouth daily.    Yes Historical Provider, MD  Cyanocobalamin (VITAMIN B12 PO) Take 1,500 mcg by mouth daily.   Yes Historical Provider, MD  folic acid (FOLVITE) 1 MG tablet TAKE 1 TABLET (1 MG TOTAL) BY MOUTH DAILY. 01/22/15  Yes Artis DelayNi Gorsuch, MD  metoprolol (LOPRESSOR) 50 MG tablet Take 1 tablet (50 mg total) by mouth 2 (two) times daily. 07/15/15  Yes Donita BrooksWarren T [name], MD  omeprazole (PRILOSEC) 40 MG capsule Take 1 capsule (40 mg total) by mouth daily. 07/15/15  Yes Beverley FiedlerJay M Pyrtle, MD  polyethylene glycol (MIRALAX / GLYCOLAX) packet Take 17 g by mouth daily.   Yes Historical Provider, MD  Flunisolide HFA [facility](AEROSPAN) 80 MCG/ACT AERS Inhale into the lungs as needed.    Historical Provider, MD  fluticasone (FLONASE) 50 MCG/ACT nasal spray Place 1 spray into both nostrils daily.    Historical Provider, MD  Magnesium 400 MG CAPS Take 400 mg by mouth daily.    Historical Provider, MD  Menthol-Methyl Salicylate (MUSCLE RUB) 10-15 % CREA Apply 1 application topically as needed for muscle pain (apply to leg as needed).    Historical Provider, MD  sucralfate (CARAFATE) 1 G tablet Take 1 tablet (1 g total) by mouth 4 (four) times daily -  before meals and at bedtime. X 1 week. Make into a slurry 08/23/15   Beverley FiedlerJay M Pyrtle, MD  sucralfate (CARAFATE) 1 GM/10ML suspension Take 10 mLs (1 g total) by mouth 4 (four) times daily -  before meals and at bedtime. X 1 week 08/22/15   Beverley FiedlerJay M Pyrtle, MD   Meds Ordered and Administered this Visit  Medications - No data to display  BP 144/78 mmHg  Pulse 71   Temp(Src) 98.4 F (36.9 C) (Oral)  Resp 16  SpO2 96% No data found.   Physical Exam  Constitutional: She is oriented to person, place, and time. She appears well-developed and well-nourished. No distress.  HENT:  Head: Normocephalic and atraumatic.  Eyes: Conjunctivae and EOM are normal. Pupils are equal, round, and reactive to light.  Neck: Normal range of motion. Neck supple.  Cardiovascular: Normal rate, regular rhythm and normal heart sounds.   Pulmonary/Chest: Effort normal and breath sounds normal. No respiratory distress. She has no wheezes. She has no rales.  Abdominal: Soft. There is no tenderness.  Musculoskeletal: Normal range of motion. She exhibits no edema.  Neurological: She is alert and oriented to person, place, and time. No cranial nerve deficit.  Skin: Skin is warm and dry.  Psychiatric: She has a normal mood and affect.    ED Course  Procedures (including critical care  time)  Labs Review Labs Reviewed - No data to display  Imaging Review No results found.   Visual Acuity Review  Right Eye Distance:   Left Eye Distance:   Bilateral Distance:    Right Eye Near:   Left Eye Near:    Bilateral Near:         MDM   1. Elevated BP   2. Pain, dental   3. Acute nonintractable headache, unspecified headache type    The patient's chief complaint for coming to the urgent care was concerned that her blood pressure was out of control. Most blood pressure readings that were elevated to a systolic of 152 was while she was in the dentist office. She confirms that she has dental problems and dental pain. She has had no chest discomfort whatsoever, no shortness of breath. The headache was a secondary concern described as a minor discomfort and throbbing. It too, started about the same time as her toothache. Neurologic history and exam unremarkable. She is very talkative and often interrupts responses to questions. She has several complaints about the health care  system in the [place] and the inability to find a doctor in 9 years, even though she states she stopped looking after the first phone call several years ago. She stated that if one doctor will not accept Medicaid did not no other doctors would. Continue taking the Lopressor as directed. Blood pressure elevations possibly due to being in the dentist office and dental pain. Strongly recommend start making phone calls to obtain a primary care doctor of your choice to manage chronic conditions. Patient is discharged in his good/stable condition. No distress whatsoever.    Hayden [name], NP 09/20/15 [phone]

## 2015-09-20 NOTE — Discharge Instructions (Signed)
General Headache Without Cause °A headache is pain or discomfort felt around the head or neck area. The specific cause of a headache may not be found. There are many causes and types of headaches. A few common ones are: °· Tension headaches. °· Migraine headaches. °· Cluster headaches. °· Chronic daily headaches. °HOME CARE INSTRUCTIONS  °Watch your condition for any changes. Take these steps to help with your condition: °Managing Pain °· Take over-the-counter and prescription medicines only as told by your health care provider. °· Lie down in a dark, quiet room when you have a headache. °· If directed, apply ice to the head and neck area: °· Put ice in a plastic bag. °· Place a towel between your skin and the bag. °· Leave the ice on for 20 minutes, 2-3 times per day. °· Use a heating pad or hot shower to apply heat to the head and neck area as told by your health care provider. °· Keep lights dim if bright lights bother you or make your headaches worse. °Eating and Drinking °· Eat meals on a regular schedule. °· Limit alcohol use. °· Decrease the amount of caffeine you drink, or stop drinking caffeine. °General Instructions °· Keep all follow-up visits as told by your health care provider. This is important. °· Keep a headache journal to help find out what may trigger your headaches. For example, write down: °· What you eat and drink. °· How much sleep you get. °· Any change to your diet or medicines. °· Try massage or other relaxation techniques. °· Limit stress. °· Sit up straight, and do not tense your muscles. °· Do not use tobacco products, including cigarettes, chewing tobacco, or e-cigarettes. If you need help quitting, ask your health care provider. °· Exercise regularly as told by your health care provider. °· Sleep on a regular schedule. Get 7-9 hours of sleep, or the amount recommended by your health care provider. °SEEK MEDICAL CARE IF:  °· Your symptoms are not helped by medicine. °· You have a  headache that is different from the usual headache. °· You have nausea or you vomit. °· You have a fever. °SEEK IMMEDIATE MEDICAL CARE IF:  °· Your headache becomes severe. °· You have repeated vomiting. °· You have a stiff neck. °· You have a loss of vision. °· You have problems with speech. °· You have pain in the eye or ear. °· You have muscular weakness or loss of muscle control. °· You lose your balance or have trouble walking. °· You feel faint or pass out. °· You have confusion. °  °This information is not intended to replace advice given to you by your health care provider. Make sure you discuss any questions you have with your health care provider. °  °Document Released: 09/28/2005 Document Revised: 06/19/2015 Document Reviewed: 01/21/2015 °Elsevier Interactive Patient Education ©2016 Elsevier Inc[employer] ° °Hypertension °Hypertension is another name for high blood pressure. High blood pressure forces your heart to work harder to pump blood. A blood pressure reading has two numbers, which includes a higher number over a lower number (example: 110/72). °HOME CARE  °· Have your blood pressure rechecked by your doctor. °· Only take medicine as told by your doctor. Follow the directions carefully. The medicine does not work as well if you skip doses. Skipping doses also puts you at risk for problems. °· Do not smoke. °· Monitor your blood pressure at home as told by your doctor. °GET HELP IF: °· You think you   are having a reaction to the medicine you are taking. °· You have repeat headaches or feel dizzy. °· You have puffiness (swelling) in your ankles. °· You have trouble with your vision. °GET HELP RIGHT AWAY IF:  °· You get a very bad headache and are confused. °· You feel weak, numb, or faint. °· You get chest or belly (abdominal) pain. °· You throw up (vomit). °· You cannot breathe very well. °MAKE SURE YOU:  °· Understand these instructions. °· Will watch your condition. °· Will get help right away if you are  not doing well or get worse. °  °This information is not intended to replace advice given to you by your health care provider. Make sure you discuss any questions you have with your health care provider. °  °Document Released: 03/16/2008 Document Revised: 10/03/2013 Document Reviewed: 07/21/2013 °Elsevier Interactive Patient Education ©2016 Elsevier Inc. ° °

## 2015-09-23 ENCOUNTER — Ambulatory Visit
Admission: RE | Admit: 2015-09-23 | Discharge: 2015-09-23 | Disposition: A | Discharge status: Home / Self Care | Payer: Medicare Other | Source: Ambulatory Visit

## 2015-09-23 DIAGNOSIS — Z1231 Encounter for screening mammogram for malignant neoplasm of breast: Secondary | ICD-10-CM

## 2015-09-30 ENCOUNTER — Other Ambulatory Visit: Payer: Medicare Other

## 2015-10-01 ENCOUNTER — Ambulatory Visit (HOSPITAL_BASED_OUTPATIENT_CLINIC_OR_DEPARTMENT_OTHER): Payer: Medicare Other | Admitting: Hematology and Oncology

## 2015-10-01 ENCOUNTER — Other Ambulatory Visit (HOSPITAL_BASED_OUTPATIENT_CLINIC_OR_DEPARTMENT_OTHER): Payer: Medicare Other

## 2015-10-01 ENCOUNTER — Ambulatory Visit: Payer: Medicare Other

## 2015-10-01 ENCOUNTER — Telehealth: Payer: Self-pay | Admitting: Hematology and Oncology

## 2015-10-01 ENCOUNTER — Encounter: Payer: Self-pay | Admitting: Hematology and Oncology

## 2015-10-01 VITALS — BP 143/88 | HR 66 | Temp 98.1°F | Resp 18 | Ht 62.0 in | Wt 192.9 lb

## 2015-10-01 DIAGNOSIS — K5520 Angiodysplasia of colon without hemorrhage: Secondary | ICD-10-CM

## 2015-10-01 DIAGNOSIS — D509 Iron deficiency anemia, unspecified: Secondary | ICD-10-CM

## 2015-10-01 DIAGNOSIS — K089 Disorder of teeth and supporting structures, unspecified: Secondary | ICD-10-CM | POA: Insufficient documentation

## 2015-10-01 LAB — CBC & DIFF AND RETIC
BASO%: 0.5 % (ref 0.0–2.0)
Basophils Absolute: 0 10*3/uL (ref 0.0–0.1)
EOS%: 4.6 % (ref 0.0–7.0)
Eosinophils Absolute: 0.3 10*3/uL (ref 0.0–0.5)
HCT: 44.1 % (ref 34.8–46.6)
HGB: 14.5 g/dL (ref 11.6–15.9)
IMMATURE RETIC FRACT: 2.5 % (ref 1.60–10.00)
LYMPH#: 1.2 10*3/uL (ref 0.9–3.3)
LYMPH%: 21 % (ref 14.0–49.7)
MCH: 29.7 pg (ref 25.1–34.0)
MCHC: 32.9 g/dL (ref 31.5–36.0)
MCV: 90.2 fL (ref 79.5–101.0)
MONO#: 0.4 10*3/uL (ref 0.1–0.9)
MONO%: 7.6 % (ref 0.0–14.0)
NEUT#: 3.7 10*3/uL (ref 1.5–6.5)
NEUT%: 66.3 % (ref 38.4–76.8)
Platelets: 230 10*3/uL (ref 145–400)
RBC: 4.89 10*6/uL (ref 3.70–5.45)
RDW: 13.2 % (ref 11.2–14.5)
RETIC %: 1.55 % (ref 0.70–2.10)
RETIC CT ABS: 75.8 10*3/uL (ref 33.70–90.70)
WBC: 5.6 10*3/uL (ref 3.9–10.3)

## 2015-10-01 LAB — FERRITIN: FERRITIN: 131 ng/mL (ref 9–269)

## 2015-10-01 LAB — HOLD TUBE, BLOOD BANK

## 2015-10-01 NOTE — Progress Notes (Signed)
[facility] OFFICE PROGRESS NOTE  [name],[name], MD SUMMARY OF HEMATOLOGIC HISTORY:  The patient has been complaining of fatigue and was found to have severe iron deficiency anemia. Approximately a year or 2 ago, she received 2 units of blood transfusion because of this. She had extensive GI evaluation with EGD and colonoscopy and was never found to have any sort of GI bleed. She has received 4 intravenous doses of iron infusion and the last infusion in 3 months ago. Bone marrow aspirate and biopsy in January 2013 showed no evidence of malignancy or persistent absence of iron stores. In 2014, she received 6 doses of intravenous iron infusion. She was referred to gastroenterology for further evaluation due to suspected GI bleed. Repeat GI endoscopy dated 12/26/2013 showed angiodysplastic lesion and moderate diverticulosis. The patient had received numerous intravenous iron infusion almost on a monthly basis from 03/15/2013 to 01/17/15. INTERVAL HISTORY: Melanie [name] 80 y.o. female returns for further workup. The patient is very distressed when she learned about new guidelines regarding IV iron. She has been Set [employer], the government, and this a lot of other healthcare providers for the demise of her friend and her problems. She blamed IV iron for her poor dentition. She has seen the dentist recently for significant dental issues. The patient denies any recent signs or symptoms of bleeding such as spontaneous epistaxis, hematuria or hematochezia.  I have reviewed the past medical history, past surgical history, social history and family history with the patient and they are unchanged from previous note.  ALLERGIES:  is allergic to cephalexin; clindamycin/lincomycin; codeine; levofloxacin; morphine and related; other; quinolones; sulfa antibiotics; tramadol; amoxicillin; fexofenadine hcl; floxin; neurontin; and septra.  MEDICATIONS:  Current Outpatient  Prescriptions  Medication Sig Dispense Refill  . aspirin 81 MG tablet Take 81 mg by mouth daily.     . Cyanocobalamin (VITAMIN B12 PO) Take 1,500 mcg by mouth daily.    . Flunisolide HFA (AEROSPAN) 80 MCG/ACT AERS Inhale into the lungs as needed.    . fluticasone (FLONASE) 50 MCG/ACT nasal spray Place 1 spray into both nostrils daily.    . folic acid (FOLVITE) 1 MG tablet TAKE 1 TABLET (1 MG TOTAL) BY MOUTH DAILY. 90 tablet 3  . Magnesium 400 MG CAPS Take 400 mg by mouth daily.    . Menthol-Methyl Salicylate (MUSCLE RUB) 10-15 % CREA Apply 1 application topically as needed for muscle pain (apply to leg as needed).    . metoprolol (LOPRESSOR) 50 MG tablet Take 1 tablet (50 mg total) by mouth 2 (two) times daily. 180 tablet 0  . omeprazole (PRILOSEC) 40 MG capsule Take 1 capsule (40 mg total) by mouth daily. 90 capsule 0  . polyethylene glycol (MIRALAX / GLYCOLAX) packet Take 17 g by mouth daily.    . sucralfate (CARAFATE) 1 G tablet Take 1 tablet (1 g total) by mouth 4 (four) times daily -  before meals and at bedtime. X 1 week. Make into a slurry 28 tablet 0  . sucralfate (CARAFATE) 1 GM/10ML suspension Take 10 mLs (1 g total) by mouth 4 (four) times daily -  before meals and at bedtime. X 1 week 300 mL 0   No current facility-administered medications for this visit.     REVIEW OF SYSTEMS:   Constitutional: Denies fevers, chills or night sweats Eyes: Denies blurriness of vision Ears, nose, mouth, throat, and face: Denies mucositis or sore throat Respiratory: Denies cough, dyspnea or wheezes Cardiovascular: Denies palpitation,  chest discomfort or lower extremity swelling Gastrointestinal:  Denies nausea, heartburn or change in bowel habits Skin: Denies abnormal skin rashes Lymphatics: Denies new lymphadenopathy or easy bruising Neurological:Denies numbness, tingling or new weaknesses Behavioral/Psych: Mood is stable, no new changes  All other systems were reviewed with the patient and are  negative.  PHYSICAL EXAMINATION: ECOG PERFORMANCE STATUS: 0 - Asymptomatic  Filed Vitals:   10/01/15 1005  BP: 143/88  Pulse: 66  Temp: 98.1 F (36.7 C)  Resp: 18   Filed Weights   10/01/15 1005  Weight: 192 lb 14.4 oz (87.499 kg)    GENERAL:alert, no distress and comfortable SKIN: skin color, texture, turgor are normal, no rashes or significant lesions EYES: normal, Conjunctiva are pink and non-injected, sclera clear OROPHARYNX:no exudate, no erythema and lips, buccal mucosa, and tongue normal. Poor dentition is noted Musculoskeletal:no cyanosis of digits and no clubbing  NEURO: alert & oriented x 3 with fluent speech, no focal motor/sensory deficits  LABORATORY DATA:  I have reviewed the data as listed Results for orders placed or performed in visit on 10/01/15 (from the past 48 hour(s))  Hold Tube, Blood Bank     Status: None   Collection Time: 10/01/15  9:45 AM  Result Value Ref Range   Hold Tube, Blood Bank Blood Bank Order Cancelled   CBC & Diff and Retic     Status: None   Collection Time: 10/01/15  9:45 AM  Result Value Ref Range   WBC 5.6 3.9 - 10.3 10e3/uL   NEUT# 3.7 1.5 - 6.5 10e3/uL   HGB 14.5 11.6 - 15.9 g/dL   HCT 14.744.1 82.934.8 - 56.246.6 %   Platelets 230 145 - 400 10e3/uL   MCV 90.2 79.5 - 101.0 fL   MCH 29.7 25.1 - 34.0 pg   MCHC 32.9 31.5 - 36.0 g/dL   RBC 1.304.89 8.653.70 - 7.845.45 10e6/uL   RDW 13.2 11.2 - 14.5 %   lymph# 1.2 0.9 - 3.3 10e3/uL   MONO# 0.4 0.1 - 0.9 10e3/uL   Eosinophils Absolute 0.3 0.0 - 0.5 10e3/uL   Basophils Absolute 0.0 0.0 - 0.1 10e3/uL   NEUT% 66.3 38.4 - 76.8 %   LYMPH% 21.0 14.0 - 49.7 %   MONO% 7.6 0.0 - 14.0 %   EOS% 4.6 0.0 - 7.0 %   BASO% 0.5 0.0 - 2.0 %   Retic % 1.55 0.70 - 2.10 %   Retic Ct Abs 75.80 33.70 - 90.70 10e3/uL   Immature Retic Fract 2.50 1.60 - 10.00 %  Ferritin     Status: None   Collection Time: 10/01/15  9:46 AM  Result Value Ref Range   Ferritin 131 9 - 269 ng/ml    Lab Results  Component Value Date    WBC 5.6 10/01/2015   HGB 14.5 10/01/2015   HCT 44.1 10/01/2015   MCV 90.2 10/01/2015   PLT 230 10/01/2015    ASSESSMENT & PLAN:  Angiodysplasia of colon She has history of iron deficiency anemia and angiodysplasia of the colon. She also has possible malabsorption syndrome. She had extensive investigations by gastroenterologist. I told the patient with the new guidelines, she would not get IV iron unless she is anemic. She has stable hemoglobin and iron studies over the past 8 months. I will switch her appointment to blood draw every 3 months and only give her IV iron if she is anemic, regardless of ferritin level.  Poor dentition She has very poor dentition. She blamed IV iron  as a cause of her poor dentition. I tried my best and explained to the patient IV iron is not the cause of the dental decay She need to follow closely with a dentist for further evaluation and management.   All questions were answered. The patient knows to call the clinic with any problems, questions or concerns. No barriers to learning was detected.  I spent 15 minutes counseling the patient face to face. The total time spent in the appointment was 20 minutes and more than 50% was on counseling.     Bre Pecina, MD 12/20/201612:58 PM

## 2015-10-01 NOTE — Assessment & Plan Note (Signed)
She has very poor dentition. She blamed IV iron as a cause of her poor dentition. I tried my best and explained to the patient IV iron is not the cause of the dental decay She need to follow closely with a dentist for further evaluation and management.

## 2015-10-01 NOTE — Telephone Encounter (Signed)
Scheduled appt with patient while he was here in person at [facility] Scheduling office.       [facility]. °

## 2015-10-01 NOTE — Assessment & Plan Note (Signed)
She has history of iron deficiency anemia and angiodysplasia of the colon. She also has possible malabsorption syndrome. She had extensive investigations by gastroenterologist. I told the patient with the new guidelines, she would not get IV iron unless she is anemic. She has stable hemoglobin and iron studies over the past 8 months. I will switch her appointment to blood draw every 3 months and only give her IV iron if she is anemic, regardless of ferritin level.

## 2015-10-07 ENCOUNTER — Other Ambulatory Visit: Payer: Self-pay | Admitting: Internal Medicine

## 2015-12-26 ENCOUNTER — Other Ambulatory Visit: Payer: Self-pay | Admitting: Hematology and Oncology

## 2015-12-31 ENCOUNTER — Telehealth: Payer: Self-pay | Admitting: Hematology and Oncology

## 2015-12-31 ENCOUNTER — Other Ambulatory Visit: Payer: Self-pay | Admitting: Hematology and Oncology

## 2015-12-31 ENCOUNTER — Telehealth: Payer: Self-pay | Admitting: *Deleted

## 2015-12-31 ENCOUNTER — Other Ambulatory Visit (HOSPITAL_BASED_OUTPATIENT_CLINIC_OR_DEPARTMENT_OTHER): Payer: Medicare Other

## 2015-12-31 DIAGNOSIS — D509 Iron deficiency anemia, unspecified: Secondary | ICD-10-CM

## 2015-12-31 LAB — CBC & DIFF AND RETIC
BASO%: 0.3 % (ref 0.0–2.0)
BASOS ABS: 0 10*3/uL (ref 0.0–0.1)
EOS%: 4.5 % (ref 0.0–7.0)
Eosinophils Absolute: 0.3 10*3/uL (ref 0.0–0.5)
HEMATOCRIT: 44 % (ref 34.8–46.6)
HGB: 14.5 g/dL (ref 11.6–15.9)
IMMATURE RETIC FRACT: 4.9 % (ref 1.60–10.00)
LYMPH#: 1.2 10*3/uL (ref 0.9–3.3)
LYMPH%: 20 % (ref 14.0–49.7)
MCH: 29.5 pg (ref 25.1–34.0)
MCHC: 33 g/dL (ref 31.5–36.0)
MCV: 89.4 fL (ref 79.5–101.0)
MONO#: 0.5 10*3/uL (ref 0.1–0.9)
MONO%: 7.8 % (ref 0.0–14.0)
NEUT#: 4 10*3/uL (ref 1.5–6.5)
NEUT%: 67.4 % (ref 38.4–76.8)
PLATELETS: 313 10*3/uL (ref 145–400)
RBC: 4.92 10*6/uL (ref 3.70–5.45)
RDW: 13.5 % (ref 11.2–14.5)
RETIC CT ABS: 86.1 10*3/uL (ref 33.70–90.70)
Retic %: 1.75 % (ref 0.70–2.10)
WBC: 6 10*3/uL (ref 3.9–10.3)

## 2015-12-31 LAB — FERRITIN: Ferritin: 76 ng/ml (ref 9–269)

## 2015-12-31 NOTE — Telephone Encounter (Signed)
Notified of results below 

## 2015-12-31 NOTE — Telephone Encounter (Signed)
s.w. pt and advised on may appt.....pt ok and aware °

## 2015-12-31 NOTE — Telephone Encounter (Signed)
s.w.l pt and adivsed on May appt....pt ok and aware

## 2015-12-31 NOTE — Telephone Encounter (Signed)
-----   Message from Artis DelayNi Gorsuch, MD sent at 12/31/2015  1:53 PM EDT ----- Regarding: labs Pls let her know labs are ok No need to treat I will add lab recheck in 6 weeks ----- Message -----    From: Lab in [facility] Interface    Sent: 12/31/2015  10:23 AM      To: Artis DelayNi Gorsuch, MD

## 2016-01-21 ENCOUNTER — Other Ambulatory Visit: Payer: Self-pay

## 2016-01-21 NOTE — Patient Outreach (Signed)
[facility] Care Management  01/21/2016  Melanie [name] June 20, 1935 [id number]   Telephone Screen  Referral Date: 01/20/16 Referral Source: patient self-referral Referral Reason: "patient wants more info on [facility]"   Outreach attempt #1 to patient. Patient reached. Screening completed.  Social: Patient resides in her home alone. She is independent with ADLs. Patient does not drive. She relies on [employer] [facility] for transportation to specialists. She states that [facility] will not take her to see PCP office as its out of the service area.  Conditions: Patient has h/o: severe iron deficiency anemia, hiatal hernia, hearing loss, poor dentition(patient blames on iron infusions) and HTN.  Medications:Patient reports taking less than 15 meds. Denies any issues with affording or managing meds.   Appointments: Patient sees PCP only once a year. She sees gastroenterologist (Pyrtle) and Hematologist(Gorsuch) often. Patient interested in finding another PCP who is closer to her home and within the [address] zip code area.  Consent: [facility]. She only agreed to RN CM services. She declined SW referral stating "she don't like any social workers at all."   Plan: RN CM will [facility] case status. [facility]. RN CM will send list of PCPs and transportation resources to patient. RN CM will call patient back in two weeks to follow up.  Antionette Fairyoshanda Oney Tatlock, RN,BSN,CCM [facility] Care Management Telephonic Care Management Coordinator Direct Phone: [phone] Toll Free: [phone] Fax: [phone]

## 2016-02-04 ENCOUNTER — Other Ambulatory Visit: Payer: Self-pay

## 2016-02-04 NOTE — Patient Outreach (Addendum)
[facility]) Care Management  02/04/2016   Melanie [name] 1935-09-25 [id number]  S/O: Patient reached. States she is doing well. She continues to be distrusting and ery vocal about her previous medical experiences. She continues to voice issue with her dentition and blames it on "iron infusions." Denies any pain. Reports she saw dentist on last month. HTN remains controlled. RN CM continues education with patient on ways to manage and control BP.No recent MD appts.No recent med changes. Patient continues to reside alone but voices having a good social support system. She states that she did receive pocket of info mailed to her from [facility] regarding resources but has not had a chance to fully review materials. Advance care planning discussed. Patient ADAMANTLY states that she ripped her papers and threw them in the trash. She states it was due to a bad EOL experience with a loved one. She voices strong resistance to any aggressive life prolonging measures. She reports she wants to "drop dead in her home and never have to go to the hospital." She does voice that she has made her dtr-Melanie [name] her HCPOA. Advised if she had provided copy to medical team and she stated that her dtr had a copy and did not want to provide medical team with copy despite lengthy conversation regarding the matter.    Current Medications:  Current Outpatient Prescriptions  Medication Sig Dispense Refill  . aspirin 81 MG tablet Take 81 mg by mouth daily.     . Cyanocobalamin (VITAMIN B12 PO) Take 1,500 mcg by mouth daily.    . Flunisolide HFA (AEROSPAN) 80 MCG/ACT AERS Inhale into the lungs as needed.    . fluticasone (FLONASE) 50 MCG/ACT nasal spray Place 1 spray into both nostrils daily.    . folic acid (FOLVITE) 1 MG tablet TAKE 1 TABLET BY MOUTH EVERY DAY 90 tablet 3  . Magnesium 400 MG CAPS Take 400 mg by mouth daily.    . Menthol-Methyl Salicylate (MUSCLE RUB) 10-15 % CREA Apply 1 application topically as  needed for muscle pain (apply to leg as needed).    . metoprolol (LOPRESSOR) 50 MG tablet Take 1 tablet (50 mg total) by mouth 2 (two) times daily. 180 tablet 0  . omeprazole (PRILOSEC) 40 MG capsule TAKE ONE CAPSULE BY MOUTH EVERY DAY 90 capsule 1  . polyethylene glycol (MIRALAX / GLYCOLAX) packet Take 17 g by mouth daily.    . sucralfate (CARAFATE) 1 G tablet Take 1 tablet (1 g total) by mouth 4 (four) times daily -  before meals and at bedtime. X 1 week. Make into a slurry (Patient not taking: Reported on 02/04/2016) 28 tablet 0  . sucralfate (CARAFATE) 1 GM/10ML suspension Take 10 mLs (1 g total) by mouth 4 (four) times daily -  before meals and at bedtime. X 1 week (Patient not taking: Reported on 02/04/2016) 300 mL 0   No current facility-administered medications for this visit.    Functional Status:  In your present state of health, do you have any difficulty performing the following activities: 02/04/2016 02/04/2016  Hearing? - Y  Difficulty concentrating or making decisions? N N  Walking or climbing stairs? N -  Dressing or bathing? N -  Doing errands, shopping? Y -  Quarry manager and eating ? N -  Using the Toilet? N -  In the past six months, have you accidently leaked urine? N -  Do you have problems with loss of bowel control? N -  Managing  your Medications? N -  Managing your Finances? N -  Housekeeping or managing your Housekeeping? N -    Fall/Depression Screening: PHQ 2/9 Scores 01/21/2016  PHQ - 2 Score 0   Fall Risk  01/21/2016  Falls in the past year? No    Assessment:   [facility] CM Care Plan Problem One        Most Recent Value   Care Plan Problem One  -- [HTN]   Role Documenting the Problem One  Care Management Telephonic Coordinator   Care Plan for Problem One  Active   [facility] Long Term Goal (31-90 days)  patient will report no abnormal BP readings for the next 31 days   [facility] Long Term Goal Start Date  02/04/16   Interventions for Problem One Long Term Goal  RN CM  provided education on ways to manage HTN   [facility] CM Short Term Goal #1 (0-30 days)  patient will be able to verbalize healthy low sodium food choices     [facility] CM Short Term Goal #1 Start Date  02/04/16   Interventions for Short Term Goal #1  RN CM provided education to patient on HTN management   [facility] CM Short Term Goal #2 (0-30 days)  patient will be abel to verbalize 2-3 ways to manage stress and maintain BP   [facility] CM Short Term Goal #2 Start Date  02/04/16   Interventions for Short Term Goal #2  RN CM provided education and emotional support to patient     Plan:  RN CM will contact patient within a month. RN CM will send barriers letter and assessment to PCP. RN CM confirmed patient is aware of how to reach [facility]. RN CM will send EMMI educational materials on HTN.  Antionette Fairyoshanda Eulamae Greenstein, RN,BSN,CCM [facility] Care Management Telephonic Care Management Coordinator Direct Phone: [phone] Toll Free: [phone] Fax: [phone]

## 2016-02-10 ENCOUNTER — Other Ambulatory Visit: Payer: Self-pay | Admitting: Family Medicine

## 2016-02-10 NOTE — Telephone Encounter (Signed)
Refill appropriate and filled per protocol. 

## 2016-02-11 ENCOUNTER — Telehealth: Payer: Self-pay | Admitting: *Deleted

## 2016-02-11 ENCOUNTER — Other Ambulatory Visit (HOSPITAL_BASED_OUTPATIENT_CLINIC_OR_DEPARTMENT_OTHER): Payer: Medicare Other

## 2016-02-11 DIAGNOSIS — D509 Iron deficiency anemia, unspecified: Secondary | ICD-10-CM | POA: Diagnosis not present

## 2016-02-11 LAB — CBC & DIFF AND RETIC
BASO%: 0.4 % (ref 0.0–2.0)
Basophils Absolute: 0 10*3/uL (ref 0.0–0.1)
EOS ABS: 0.2 10*3/uL (ref 0.0–0.5)
EOS%: 3.6 % (ref 0.0–7.0)
HEMATOCRIT: 43.6 % (ref 34.8–46.6)
HGB: 14.4 g/dL (ref 11.6–15.9)
IMMATURE RETIC FRACT: 5.3 % (ref 1.60–10.00)
LYMPH#: 1.2 10*3/uL (ref 0.9–3.3)
LYMPH%: 21.7 % (ref 14.0–49.7)
MCH: 29 pg (ref 25.1–34.0)
MCHC: 33 g/dL (ref 31.5–36.0)
MCV: 87.9 fL (ref 79.5–101.0)
MONO#: 0.5 10*3/uL (ref 0.1–0.9)
MONO%: 8 % (ref 0.0–14.0)
NEUT#: 3.7 10*3/uL (ref 1.5–6.5)
NEUT%: 66.3 % (ref 38.4–76.8)
PLATELETS: 264 10*3/uL (ref 145–400)
RBC: 4.96 10*6/uL (ref 3.70–5.45)
RDW: 13.5 % (ref 11.2–14.5)
RETIC %: 1.79 % (ref 0.70–2.10)
RETIC CT ABS: 88.78 10*3/uL (ref 33.70–90.70)
WBC: 5.6 10*3/uL (ref 3.9–10.3)
nRBC: 0 % (ref 0–0)

## 2016-02-11 LAB — FERRITIN: FERRITIN: 55 ng/mL (ref 9–269)

## 2016-02-11 NOTE — Telephone Encounter (Signed)
Pt notified of results below 

## 2016-02-11 NOTE — Telephone Encounter (Signed)
-----   Message from Artis DelayNi Gorsuch, MD sent at 02/11/2016 12:41 PM EDT ----- Regarding: labs Please let her know labs are stable. Continue monitoring as scheduled  ----- Message -----    From: [facility] Interface    Sent: 02/11/2016  11:18 AM      To: Artis DelayNi Gorsuch, MD

## 2016-02-28 ENCOUNTER — Other Ambulatory Visit: Payer: Self-pay

## 2016-02-28 ENCOUNTER — Ambulatory Visit: Payer: Self-pay

## 2016-02-28 NOTE — Patient Outreach (Signed)
[facility]) Care Management  02/28/2016  Melanie [name] [birth year] [id number]   Telephone Assessment   Outreach attempt #1 to patient. No answer. RN CM left HIPAA compliant voicemail message along with contact info.   Plan: RN CM will make outreach attempt to patient within a week.   Antionette Fairyoshanda Penelope Fittro, RN,BSN,CCM [facility] Care Management Telephonic Care Management Coordinator Direct Phone: [phone] Toll Free: [phone] Fax: [phone]

## 2016-03-03 ENCOUNTER — Other Ambulatory Visit: Payer: Self-pay

## 2016-03-03 NOTE — Patient Outreach (Signed)
[facility]) Care Management  03/03/2016  Melanie [name] Oct 24, 1934 [id number]   Case Closure   Outreach attempt #2 to patient. Patient reached. Patient states she is doing well. Denies any new issues or concerns. She is pleased to report that she has been feeling better rest and sleep at night which is helping her to feel better. She has MD appt on Friday of this week. However, she states she is unsure if she will be able to keep appt as she also has an appt with the lawyer. She voices that she is working on making addendums to her living will and updating her advance directives. Patient did receive and review educational info sent out to her regarding HTN. She voices that her dtr is coming to town to visit in about a week and plans to share the info with her as well. Patient has no other RN CM needs at this time. Discussed case closure and patient in agreement. She has [facility] contact info for future reference.   Plan: RN CM will notify [facility] administrative assistant of case closure. RN CM will send patient case closure letter. RN CM will send MD case closure letter.  Antionette Fairyoshanda Tamaria Dunleavy, RN,BSN,CCM [facility] Care Management Telephonic Care Management Coordinator Direct Phone: [phone] Toll Free: [phone] Fax: [phone]

## 2016-03-06 ENCOUNTER — Ambulatory Visit: Payer: Medicare Other | Admitting: Internal Medicine

## 2016-03-09 ENCOUNTER — Other Ambulatory Visit: Payer: Self-pay | Admitting: Family Medicine

## 2016-03-14 ENCOUNTER — Other Ambulatory Visit: Payer: Self-pay | Admitting: Internal Medicine

## 2016-03-30 ENCOUNTER — Other Ambulatory Visit: Payer: Medicare Other

## 2016-04-09 ENCOUNTER — Other Ambulatory Visit: Payer: Self-pay | Admitting: Internal Medicine

## 2016-04-21 ENCOUNTER — Telehealth: Payer: Self-pay | Admitting: *Deleted

## 2016-04-21 ENCOUNTER — Other Ambulatory Visit (HOSPITAL_BASED_OUTPATIENT_CLINIC_OR_DEPARTMENT_OTHER): Payer: Medicare Other

## 2016-04-21 ENCOUNTER — Other Ambulatory Visit: Payer: Self-pay | Admitting: Hematology and Oncology

## 2016-04-21 DIAGNOSIS — D509 Iron deficiency anemia, unspecified: Secondary | ICD-10-CM

## 2016-04-21 LAB — CBC & DIFF AND RETIC
BASO%: 0.3 % (ref 0.0–2.0)
BASOS ABS: 0 10*3/uL (ref 0.0–0.1)
EOS ABS: 0.2 10*3/uL (ref 0.0–0.5)
EOS%: 2.5 % (ref 0.0–7.0)
HEMATOCRIT: 42.8 % (ref 34.8–46.6)
HEMOGLOBIN: 14 g/dL (ref 11.6–15.9)
Immature Retic Fract: 4.3 % (ref 1.60–10.00)
LYMPH%: 15.8 % (ref 14.0–49.7)
MCH: 28.6 pg (ref 25.1–34.0)
MCHC: 32.7 g/dL (ref 31.5–36.0)
MCV: 87.5 fL (ref 79.5–101.0)
MONO#: 0.4 10*3/uL (ref 0.1–0.9)
MONO%: 5.4 % (ref 0.0–14.0)
NEUT#: 5 10*3/uL (ref 1.5–6.5)
NEUT%: 76 % (ref 38.4–76.8)
Platelets: 272 10*3/uL (ref 145–400)
RBC: 4.89 10*6/uL (ref 3.70–5.45)
RDW: 13.6 % (ref 11.2–14.5)
Retic %: 1.58 % (ref 0.70–2.10)
Retic Ct Abs: 77.26 10*3/uL (ref 33.70–90.70)
WBC: 6.5 10*3/uL (ref 3.9–10.3)
lymph#: 1 10*3/uL (ref 0.9–3.3)

## 2016-04-21 LAB — FERRITIN: Ferritin: 56 ng/ml (ref 9–269)

## 2016-04-21 NOTE — Telephone Encounter (Signed)
Informed pt of Dr. Gorsuch's message. She verbalized understanding.  

## 2016-04-21 NOTE — Telephone Encounter (Signed)
-----   Message from Artis DelayNi Gorsuch, MD sent at 04/21/2016 11:49 AM EDT ----- Regarding: labs stable Pls let her know labs are stable She is not anemic

## 2016-05-13 ENCOUNTER — Encounter: Payer: Self-pay | Admitting: Internal Medicine

## 2016-05-13 ENCOUNTER — Ambulatory Visit (INDEPENDENT_AMBULATORY_CARE_PROVIDER_SITE_OTHER): Payer: Medicare Other | Admitting: Internal Medicine

## 2016-05-13 VITALS — BP 120/74 | HR 84 | Ht 62.0 in | Wt 195.5 lb

## 2016-05-13 DIAGNOSIS — D509 Iron deficiency anemia, unspecified: Secondary | ICD-10-CM

## 2016-05-13 DIAGNOSIS — Z8711 Personal history of peptic ulcer disease: Secondary | ICD-10-CM

## 2016-05-13 DIAGNOSIS — K219 Gastro-esophageal reflux disease without esophagitis: Secondary | ICD-10-CM

## 2016-05-13 NOTE — Progress Notes (Signed)
Subjective:    Patient ID: Melanie [name], female    DOB: 17-Aug-1935, 81 y.o.   MRN: [id number]  HPI Melanie [name] is a 81 year old female with history of IDA, hiatal hernia, duodenal ulcer (H. pylori negative), colonic angiodysplasia (ablated with ABC) who is here for follow-up. She was last seen in November 2016. She is followed by Dr. Bertis Ruddy with hematology. Recently her iron studies and hemoglobin have been normal. She reports from a GI perspective she has been feeling well. Her right upper quadrant pain previously noticed with movement seems to be better. She reports good appetite without nausea or vomiting. Denies heartburn. Denies trouble swallowing. She does try to avoid meats and fried fatty foods. This seems to help her reflux. Reports bowel movements recently is regular. She continues MiraLAX daily which helps prevent constipation. She is having trouble with bilateral lower extremity pain which she feels is neuropathy and also restless leg. She requests the office contact information for Dr. Arbutus Leas. She had CBC and ferritin in July 2017 which was normal.  Review of Systems As per history of present illness, otherwise negative  Current Medications, Allergies, Past Medical History, Past Surgical History, Family History and Social History were reviewed in [employer] record.     Objective:   Physical Exam BP 120/74 (BP Location: Left Arm, Patient Position: Sitting, Cuff Size: Normal)   Pulse 84   Ht  (1.575 m) Comment: height measured without shoes  Wt 195 lb 8 oz (88.7 kg)   BMI 35.76 kg/m  Constitutional: Well-developed and well-nourished. No distress. HEENT: Normocephalic and atraumatic.  Conjunctivae are normal.  No scleral icterus. Neck: Neck supple. Trachea midline. Cardiovascular: Normal rate, regular rhythm and intact distal pulses. Pulmonary/chest: Effort normal and breath sounds normal. No wheezing, rales or rhonchi. Abdominal: Soft, obese,  nontender, nondistended. Bowel sounds active throughout Extremities: no clubbing, cyanosis, or edema Neurological: Alert and oriented to person place and time. Skin: Skin is warm and dry.  Psychiatric: Normal mood and affect. Behavior is normal.  CBC    Component Value Date/Time   WBC 6.5 04/21/2016 0951   WBC 5.3 11/12/2011 0745   RBC 4.89 04/21/2016 0951   RBC 3.32 (L) 01/10/2015 1150   RBC 4.52 11/12/2011 0745   HGB 14.0 04/21/2016 0951   HCT 42.8 04/21/2016 0951   PLT 272 04/21/2016 0951   MCV 87.5 04/21/2016 0951   MCH 28.6 04/21/2016 0951   MCH 22.3 (L) 11/12/2011 0745   MCHC 32.7 04/21/2016 0951   MCHC 30.5 11/12/2011 0745   RDW 13.6 04/21/2016 0951   LYMPHSABS 1.0 04/21/2016 0951   MONOABS 0.4 04/21/2016 0951   EOSABS 0.2 04/21/2016 0951   BASOSABS 0.0 04/21/2016 0951   Iron/TIBC/Ferritin/ %Sat    Component Value Date/Time   IRON 116 04/09/2015 1029   TIBC 305 04/09/2015 1029   FERRITIN 56 04/21/2016 0951   IRONPCTSAT 38 04/09/2015 1029   IRONPCTSAT 8 (L) 12/23/2012 1101       Assessment & Plan:  81 year old female with history of IDA, hiatal hernia, duodenal ulcer (H. pylori negative), colonic angiodysplasia (ablated with ABC) who is here for follow-up.  1. IDA -- improved after iron infusion. Normal hemoglobin in early July. Following with Dr. Bertis Ruddy which with hematology for periodic iron transfusion as needed. We have modified her risk factors including treating ulcer disease and ablating colonic angiodysplastic lesion.  2. GERD with history of ulcer disease -- continue omeprazole 40 mg daily. Continue GERD diet  3. Constipation -- continue MiraLAX 17 g daily  6 to 12 month follow-up, sooner if necessary 15 minutes spent with the patient today. Greater than 50% was spent in counseling and coordination of care with the patient

## 2016-05-13 NOTE — Patient Instructions (Signed)
If you are age 79 or older, your body mass index should be between 23-30. Your Body mass index is 35.76 kg/m. If this is out of the aforementioned range listed, please consider follow up with your Primary Care Provider.  If you are age 1 or younger, your body mass index should be between 19-25. Your Body mass index is 35.76 kg/m. If this is out of the aformentioned range listed, please consider follow up with your Primary Care Provider.   Please continue current medications.  Dr Lurena Joiner Tat @ [facility] [phone]  Follow up in 9-12 months   Thank you for choosing [facility]  Dr. Erick Blinks

## 2016-06-29 ENCOUNTER — Other Ambulatory Visit (HOSPITAL_BASED_OUTPATIENT_CLINIC_OR_DEPARTMENT_OTHER): Payer: Medicare Other

## 2016-06-29 ENCOUNTER — Telehealth: Payer: Self-pay | Admitting: *Deleted

## 2016-06-29 DIAGNOSIS — D509 Iron deficiency anemia, unspecified: Secondary | ICD-10-CM | POA: Diagnosis present

## 2016-06-29 LAB — CBC & DIFF AND RETIC
BASO%: 0.4 % (ref 0.0–2.0)
BASOS ABS: 0 10*3/uL (ref 0.0–0.1)
EOS ABS: 0.2 10*3/uL (ref 0.0–0.5)
EOS%: 4.5 % (ref 0.0–7.0)
HEMATOCRIT: 42.7 % (ref 34.8–46.6)
HEMOGLOBIN: 14 g/dL (ref 11.6–15.9)
Immature Retic Fract: 3 % (ref 1.60–10.00)
LYMPH%: 19.2 % (ref 14.0–49.7)
MCH: 28.9 pg (ref 25.1–34.0)
MCHC: 32.8 g/dL (ref 31.5–36.0)
MCV: 88 fL (ref 79.5–101.0)
MONO#: 0.5 10*3/uL (ref 0.1–0.9)
MONO%: 8.7 % (ref 0.0–14.0)
NEUT#: 3.5 10*3/uL (ref 1.5–6.5)
NEUT%: 67.2 % (ref 38.4–76.8)
PLATELETS: 248 10*3/uL (ref 145–400)
RBC: 4.85 10*6/uL (ref 3.70–5.45)
RDW: 13.6 % (ref 11.2–14.5)
RETIC %: 1.59 % (ref 0.70–2.10)
Retic Ct Abs: 77.12 10*3/uL (ref 33.70–90.70)
WBC: 5.2 10*3/uL (ref 3.9–10.3)
lymph#: 1 10*3/uL (ref 0.9–3.3)

## 2016-06-29 LAB — FERRITIN: Ferritin: 52 ng/ml (ref 9–269)

## 2016-06-29 NOTE — Telephone Encounter (Signed)
-----   Message from Artis DelayNi Gorsuch, MD sent at 06/29/2016 10:44 AM EDT ----- Regarding: labs stable Pls call her with test results ----- Message ----- From: Interface, [facility] One Sent: 06/29/2016   9:40 AM To: Artis DelayNi Gorsuch, MD

## 2016-06-29 NOTE — Telephone Encounter (Signed)
Notified of results

## 2016-07-11 ENCOUNTER — Other Ambulatory Visit: Payer: Self-pay | Admitting: Internal Medicine

## 2016-07-13 ENCOUNTER — Telehealth: Payer: Self-pay | Admitting: Internal Medicine

## 2016-07-13 NOTE — Telephone Encounter (Signed)
Rx was already sent this morning

## 2016-07-22 ENCOUNTER — Telehealth: Payer: Self-pay | Admitting: Hematology and Oncology

## 2016-07-22 NOTE — Telephone Encounter (Signed)
09/28/2016 Appointment rescheduled to 10/26/2016 per patient request. The patient will be out of [place] in [place] and does not plan to return until January of 2018.

## 2016-08-04 ENCOUNTER — Other Ambulatory Visit: Payer: Self-pay | Admitting: Family Medicine

## 2016-08-04 ENCOUNTER — Encounter: Payer: Self-pay | Admitting: Family Medicine

## 2016-08-04 MED ORDER — METOPROLOL TARTRATE 50 MG PO TABS: 50.0000 mg | ORAL_TABLET | Freq: Two times a day (BID) | ORAL | 0 refills | Status: DC

## 2016-08-04 NOTE — Telephone Encounter (Signed)
Medication called/sent to requested pharmacy and letter sent to make appt

## 2016-08-17 ENCOUNTER — Other Ambulatory Visit: Payer: Self-pay | Admitting: Family Medicine

## 2016-09-10 ENCOUNTER — Telehealth: Payer: Self-pay | Admitting: Internal Medicine

## 2016-09-10 NOTE — Telephone Encounter (Signed)
Patient is requesting to establish an appt with MD.  Would Dr. Yetta BarreJones be able to take patient on?

## 2016-09-28 ENCOUNTER — Other Ambulatory Visit: Payer: Medicare Other

## 2016-09-28 ENCOUNTER — Ambulatory Visit: Payer: Medicare Other | Admitting: Hematology and Oncology

## 2016-10-26 ENCOUNTER — Ambulatory Visit (HOSPITAL_BASED_OUTPATIENT_CLINIC_OR_DEPARTMENT_OTHER): Payer: Medicare Other | Admitting: Hematology and Oncology

## 2016-10-26 ENCOUNTER — Encounter: Payer: Self-pay | Admitting: Hematology and Oncology

## 2016-10-26 ENCOUNTER — Telehealth: Payer: Self-pay | Admitting: Hematology and Oncology

## 2016-10-26 ENCOUNTER — Other Ambulatory Visit (HOSPITAL_BASED_OUTPATIENT_CLINIC_OR_DEPARTMENT_OTHER): Payer: Medicare Other

## 2016-10-26 DIAGNOSIS — K5520 Angiodysplasia of colon without hemorrhage: Secondary | ICD-10-CM | POA: Diagnosis not present

## 2016-10-26 DIAGNOSIS — D509 Iron deficiency anemia, unspecified: Secondary | ICD-10-CM

## 2016-10-26 LAB — CBC & DIFF AND RETIC
BASO%: 0.3 % (ref 0.0–2.0)
Basophils Absolute: 0 10*3/uL (ref 0.0–0.1)
EOS%: 4 % (ref 0.0–7.0)
Eosinophils Absolute: 0.3 10*3/uL (ref 0.0–0.5)
HCT: 39.7 % (ref 34.8–46.6)
HGB: 13.2 g/dL (ref 11.6–15.9)
IMMATURE RETIC FRACT: 5.3 % (ref 1.60–10.00)
LYMPH#: 1.2 10*3/uL (ref 0.9–3.3)
LYMPH%: 18.8 % (ref 14.0–49.7)
MCH: 29.1 pg (ref 25.1–34.0)
MCHC: 33.2 g/dL (ref 31.5–36.0)
MCV: 87.6 fL (ref 79.5–101.0)
MONO#: 0.4 10*3/uL (ref 0.1–0.9)
MONO%: 6 % (ref 0.0–14.0)
NEUT#: 4.5 10*3/uL (ref 1.5–6.5)
NEUT%: 70.9 % (ref 38.4–76.8)
PLATELETS: 283 10*3/uL (ref 145–400)
RBC: 4.53 10*6/uL (ref 3.70–5.45)
RDW: 13.7 % (ref 11.2–14.5)
RETIC CT ABS: 100.11 10*3/uL — AB (ref 33.70–90.70)
Retic %: 2.21 % — ABNORMAL HIGH (ref 0.70–2.10)
WBC: 6.3 10*3/uL (ref 3.9–10.3)

## 2016-10-26 LAB — FERRITIN: FERRITIN: 20 ng/mL (ref 9–269)

## 2016-10-26 MED ORDER — FOLIC ACID 1 MG PO TABS: 1.0000 mg | ORAL_TABLET | Freq: Every day | ORAL | 3 refills | Status: DC

## 2016-10-26 NOTE — Assessment & Plan Note (Signed)
She has history of iron deficiency anemia and angiodysplasia of the colon. She also has possible malabsorption syndrome. She had extensive investigations by gastroenterologist. I told the patient with the new guidelines, she would not get IV iron unless she is anemic. She has stable hemoglobin and iron studies over the past 12 months She has close follow-up with her GI specialist as well. I recommend folic acid supplementation and she agreed

## 2016-10-26 NOTE — Progress Notes (Signed)
[facility] OFFICE PROGRESS NOTE  [name],[name], MD SUMMARY OF HEMATOLOGIC HISTORY:  The patient has been complaining of fatigue and was found to have severe iron deficiency anemia. Approximately a year or 2 ago, she received 2 units of blood transfusion because of this. She had extensive GI evaluation with EGD and colonoscopy and was never found to have any sort of GI bleed. She has received 4 intravenous doses of iron infusion and the last infusion in 3 months ago. Bone marrow aspirate and biopsy in January 2013 showed no evidence of malignancy or persistent absence of iron stores. In 2014, she received 6 doses of intravenous iron infusion. She was referred to gastroenterology for further evaluation due to suspected GI bleed. Repeat GI endoscopy dated 12/26/2013 showed angiodysplastic lesion and moderate diverticulosis. The patient had received numerous intravenous iron infusion almost on a monthly basis from 03/15/2013 to 01/17/15. Her last intravenous iron was in July 2016 INTERVAL HISTORY: Melanie [name] 82 y.o. female returns for further follow-up. The patient shared with me that she is currently divorced for over a year She had recent bladder infection, treated with doxycycline She feels well. 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   I have reviewed the past medical history, past surgical history, social history and family history with the patient and they are unchanged from previous note.  ALLERGIES:  is allergic to cephalexin; clindamycin/lincomycin; codeine; levofloxacin; morphine and related; other; quinolones; sulfa antibiotics; tramadol; amoxicillin; fexofenadine hcl; floxin [ocuflox]; neurontin [gabapentin]; and septra [bactrim].  MEDICATIONS:  Current Outpatient Prescriptions   Medication Sig Dispense Refill  . aspirin 81 MG tablet Take 81 mg by mouth daily.     . Cyanocobalamin (VITAMIN B12 PO) Take 1,500 mcg by mouth daily.    . fluticasone (FLONASE) 50 MCG/ACT nasal spray Place 1 spray into both nostrils daily.    . folic acid (FOLVITE) 1 MG tablet Take 1 tablet (1 mg total) by mouth daily. 90 tablet 3  . Magnesium 400 MG CAPS Take 400 mg by mouth daily.    . Menthol-Methyl Salicylate (MUSCLE RUB) 10-15 % CREA Apply 1 application topically as needed for muscle pain (apply to leg as needed).    . metoprolol (LOPRESSOR) 50 MG tablet Take 1 tablet (50 mg total) by mouth 2 (two) times daily. 180 tablet 0  . omeprazole (PRILOSEC) 40 MG capsule TAKE ONE CAPSULE BY MOUTH EVERY DAY 30 capsule 2  . polyethylene glycol (MIRALAX / GLYCOLAX) packet Take 17 g by mouth daily.    Marland Kitchen. UNABLE TO FIND Med Name:  [facility] Hot Lidocaine Cream over the counter as needed for joint and muscle pain     No current facility-administered medications for this visit.      REVIEW OF SYSTEMS:   Constitutional: Denies fevers, chills or night sweats Eyes: Denies blurriness of vision Ears, nose, mouth, throat, and face: Denies mucositis or sore throat Respiratory: Denies cough, dyspnea or wheezes Cardiovascular: Denies palpitation, chest discomfort or lower extremity swelling Gastrointestinal:  Denies nausea, heartburn or change in bowel habits Skin: Denies abnormal skin rashes Lymphatics: Denies new lymphadenopathy or easy bruising Neurological:Denies numbness, tingling or new weaknesses Behavioral/Psych: Mood is stable, no new changes  All other systems were reviewed with the patient and are negative.  PHYSICAL EXAMINATION: ECOG PERFORMANCE STATUS: 0 - Asymptomatic  Vitals:   10/26/16 0852  BP: (!) 146/76  Pulse: 82  Resp: 18  Temp: 98.2 F (36.8 C)   Filed Weights   10/26/16 0852  Weight: 197 lb 3.2 oz (89.4 kg)    GENERAL:alert, no distress and comfortable SKIN: skin color,  texture, turgor are normal, no rashes or significant lesions EYES: normal, Conjunctiva are pink and non-injected, sclera clear Musculoskeletal:no cyanosis of digits and no clubbing  NEURO: alert & oriented x 3 with fluent speech, no focal motor/sensory deficits  LABORATORY DATA:  I have reviewed the data as listed     Component Value Date/Time   NA 141 08/22/2015 1041   NA 144 02/12/2014 0849   K 4.3 08/22/2015 1041   K 3.6 02/12/2014 0849   CL 106 08/22/2015 1041   CL 109 (H) 12/23/2012 1100   CO2 29 08/22/2015 1041   CO2 24 02/12/2014 0849   GLUCOSE 95 08/22/2015 1041   GLUCOSE 152 (H) 02/12/2014 0849   GLUCOSE 106 (H) 12/23/2012 1100   BUN 16 08/22/2015 1041   BUN 15.4 02/12/2014 0849   CREATININE 0.85 08/22/2015 1041   CREATININE 0.8 02/12/2014 0849   CALCIUM 11.1 (H) 08/22/2015 1041   CALCIUM 10.7 (H) 02/12/2014 0849   PROT 7.1 08/22/2015 1041   PROT 6.3 (L) 02/12/2014 0849   ALBUMIN 4.1 08/22/2015 1041   ALBUMIN 3.4 (L) 02/12/2014 0849   AST 17 08/22/2015 1041   AST 13 02/12/2014 0849   ALT 16 08/22/2015 1041   ALT 8 02/12/2014 0849   ALKPHOS 89 08/22/2015 1041   ALKPHOS 78 02/12/2014 0849   BILITOT 0.5 08/22/2015 1041   BILITOT 0.25 02/12/2014 0849   GFRNONAA 80 (L) 08/22/2011 1205   GFRAA >90 08/22/2011 1205    No results found for: SPEP, UPEP  Lab Results  Component Value Date   WBC 6.3 10/26/2016   NEUTROABS 4.5 10/26/2016   HGB 13.2 10/26/2016   HCT 39.7 10/26/2016   MCV 87.6 10/26/2016   PLT 283 10/26/2016      Chemistry      Component Value Date/Time   NA 141 08/22/2015 1041   NA 144 02/12/2014 0849   K 4.3 08/22/2015 1041   K 3.6 02/12/2014 0849   CL 106 08/22/2015 1041   CL 109 (H) 12/23/2012 1100   CO2 29 08/22/2015 1041   CO2 24 02/12/2014 0849   BUN 16 08/22/2015 1041   BUN 15.4 02/12/2014 0849   CREATININE 0.85 08/22/2015 1041   CREATININE 0.8 02/12/2014 0849      Component Value Date/Time   CALCIUM 11.1 (H) 08/22/2015 1041    CALCIUM 10.7 (H) 02/12/2014 0849   ALKPHOS 89 08/22/2015 1041   ALKPHOS 78 02/12/2014 0849   AST 17 08/22/2015 1041   AST 13 02/12/2014 0849   ALT 16 08/22/2015 1041   ALT 8 02/12/2014 0849   BILITOT 0.5 08/22/2015 1041   BILITOT 0.25 02/12/2014 0849       ASSESSMENT & PLAN:  Iron deficiency anemia The most likely cause of her anemia is due to chronic blood loss/malabsorption syndrome. She had extensive investigations by gastroenterologist. The patient has stable CBC for over a year and has not received any intravenous iron infusion since 2016 She is establishing with new primary care doctor soon I recommend transitioning her blood work monitoring through her primary care doctor only. She would like to think about it I plan to bring her back for another blood draw in 6 months. She agreed  Angiodysplasia of colon She has history of iron deficiency anemia and angiodysplasia of the colon. She also has possible malabsorption syndrome. She had extensive investigations by gastroenterologist. I told the patient with the new guidelines, she would not get IV iron unless she is anemic. She has stable hemoglobin and iron studies over the past 12 months She has close follow-up with her GI specialist as well. I recommend folic acid supplementation and she agreed   No orders of the defined types were placed in this encounter.   All questions were answered. The patient knows to call the clinic with any problems, questions or concerns. No barriers to learning was detected.  I spent 10 minutes counseling the patient face to face. The total time spent in the appointment was 15 minutes and more than 50% was on counseling.     [name], MD 1/15/201810:30 AM

## 2016-10-26 NOTE — Telephone Encounter (Signed)
Gave patient avs report and appointments for July. Lab only per 10/26/16 los.

## 2016-10-26 NOTE — Assessment & Plan Note (Signed)
The most likely cause of her anemia is due to chronic blood loss/malabsorption syndrome. She had extensive investigations by gastroenterologist. The patient has stable CBC for over a year and has not received any intravenous iron infusion since 2016 She is establishing with new primary care doctor soon I recommend transitioning her blood work monitoring through her primary care doctor only. She would like to think about it I plan to bring her back for another blood draw in 6 months. She agreed

## 2016-10-30 ENCOUNTER — Other Ambulatory Visit: Payer: Self-pay | Admitting: Internal Medicine

## 2016-11-02 ENCOUNTER — Encounter: Payer: Self-pay | Admitting: Family Medicine

## 2016-11-02 ENCOUNTER — Ambulatory Visit (INDEPENDENT_AMBULATORY_CARE_PROVIDER_SITE_OTHER): Payer: Medicare Other | Admitting: Family Medicine

## 2016-11-02 VITALS — BP 140/80 | HR 83 | Resp 12 | Ht 62.0 in | Wt 195.1 lb

## 2016-11-02 DIAGNOSIS — I10 Essential (primary) hypertension: Secondary | ICD-10-CM

## 2016-11-02 DIAGNOSIS — M159 Polyosteoarthritis, unspecified: Secondary | ICD-10-CM

## 2016-11-02 DIAGNOSIS — K269 Duodenal ulcer, unspecified as acute or chronic, without hemorrhage or perforation: Secondary | ICD-10-CM | POA: Diagnosis not present

## 2016-11-02 LAB — BASIC METABOLIC PANEL
BUN: 18 mg/dL (ref 6–23)
CO2: 28 mEq/L (ref 19–32)
Calcium: 10.6 mg/dL — ABNORMAL HIGH (ref 8.4–10.5)
Chloride: 106 mEq/L (ref 96–112)
Creatinine, Ser: 0.75 mg/dL (ref 0.40–1.20)
GFR: 79.11 mL/min (ref >60.00)
Glucose, Bld: 99 mg/dL (ref 70–99)
Potassium: 4.3 mEq/L (ref 3.5–5.1)
SODIUM: 140 meq/L (ref 135–145)

## 2016-11-02 MED ORDER — METOPROLOL TARTRATE 50 MG PO TABS: 50.0000 mg | ORAL_TABLET | Freq: Two times a day (BID) | ORAL | 1 refills | Status: AC

## 2016-11-02 NOTE — Patient Instructions (Signed)
A few things to remember from today's visit:   Essential hypertension  Duodenal ulcer disease  Osteoarthritis of multiple joints, unspecified osteoarthritis type   Medicare covers a annual preventive visit, which is strongly recommended , it is once per year and involves a series of questions to identify risk factors; so we can try to prevent possible complications. This does not need to be done by a doctor.  We have a nurse Banker(RN) here that is highly qualified to do it, it can be arrange same date you have a follow up appointment with me or labs scheduled, and it 100% covered by Medicare. So before you leave today I would like for you to arrange visit with [name] for Medicare wellness visit.  Please be sure medication list is accurate. If a new problem present, please set up appointment sooner than planned today.

## 2016-11-02 NOTE — Progress Notes (Signed)
Pre visit review using our clinic review tool, if applicable. No additional management support is needed unless otherwise documented below in the visit note. 

## 2016-11-02 NOTE — Progress Notes (Signed)
HPI:   Melanie [name] is a 82 y.o. female, who is here today to establish care with me.  Former PCP: Dr Tanya Nones. Last preventive routine visit: over a year ago.   She lives alone. Daughter lives in [place] state and visits periodically. She needs assistance for some ADL's and IADL's. She uses a cane for transfer and wears pull ups for occasional urine leakage, stress incontinence. She does not drive, uses [employer] service for transportation.   No falls in the past year and denies depression symptoms.  Hx of OA (knees since 2015, lower back, and hips) OTC Icy Hot with Lidocaine helps with pain.  She follows with hematologists , now every 6 months. She has Hx of iron def anemia, PUD seemed to be the etiology. She takes Omeprazole 40 mg daily. Hx of A-V GI malformations, angiodysplasia of colon.  Denies abdominal pain, nausea, vomiting, changes in bowel habits, blood in stool or melena.  She takes Aspirin 81 mg daily.  Lab Results  Component Value Date   WBC 6.3 10/26/2016   HGB 13.2 10/26/2016   HCT 39.7 10/26/2016   MCV 87.6 10/26/2016   PLT 283 10/26/2016    She also follows with ENT as needed for right cerumen impaction, hearing loss, and chronic tinnitus.   Hypertension:  Currently on Metoprolol  50 mg bid. She has Hx of HTN for many years, attributed to stress due to marital issues. She taking medications as instructed, no side effects reported.  She has not noted unusual headache, visual changes, exertional chest pain, dyspnea, or edema.   Lab Results  Component Value Date   CREATININE 0.85 08/22/2015   BUN 16 08/22/2015   NA 141 08/22/2015   K 4.3 08/22/2015   CL 106 08/22/2015   CO2 29 08/22/2015     Recently treated for UTI, 09/2016 while she was visiting daughter. Hx of stress and urgency incontinence, improved after abx treatment (Doxycycline). She denies dysuria, abdominal pain, or gross hematuria.    Hx of hyper Ca++  Lab Results    Component Value Date   ALT 16 08/22/2015   AST 17 08/22/2015   ALKPHOS 89 08/22/2015   BILITOT 0.5 08/22/2015   She has had some dental issues, broken teeth, she states that she has removed small tooth pieces with twisters  to relieve gum discomfort. DEXA done 1-2 years ago, 09/21/14, I cannot open report.  According to pt, her former PCP recommended stopping Ca++ supplementation.   Finally she also mentions that she is concerned "very worry" about her heart and requesting an "echocardiogram." According to pt, she has requested it from former PCP but has not been ordered. She denies chest pain, dyspnea, palpitations. She states that she would like it done because years ago she had one done and some abnormalities was reported.  She has not followed with cardiologists in the past.    Review of Systems  Constitutional: Negative for activity change, appetite change, fatigue, fever and unexpected weight change.  HENT: Positive for dental problem, hearing loss (stable) and tinnitus (chronic). Negative for ear pain, mouth sores, nosebleeds, trouble swallowing and voice change.   Eyes: Negative for pain, redness and visual disturbance.  Respiratory: Negative for cough, shortness of breath and wheezing.   Cardiovascular: Negative for chest pain, palpitations and leg swelling.  Gastrointestinal: Negative for abdominal pain, blood in stool, nausea and vomiting.       Negative for changes in bowel habits.  Endocrine: Negative for  cold intolerance, heat intolerance, polydipsia, polyphagia and polyuria.  Genitourinary: Negative for decreased urine volume, difficulty urinating, dysuria, hematuria and vaginal discharge.  Musculoskeletal: Positive for arthralgias and gait problem.  Skin: Negative for rash.  Neurological: Negative for syncope, weakness and headaches.  Psychiatric/Behavioral: Negative for confusion. The patient is nervous/anxious.       Current Outpatient Prescriptions on File  Prior to Visit  Medication Sig Dispense Refill  . aspirin 81 MG tablet Take 81 mg by mouth daily.     . Cyanocobalamin (VITAMIN B12 PO) Take 1,500 mcg by mouth daily.    . fluticasone (FLONASE) 50 MCG/ACT nasal spray Place 1 spray into both nostrils daily.    . folic acid (FOLVITE) 1 MG tablet Take 1 tablet (1 mg total) by mouth daily. 90 tablet 3  . Magnesium 400 MG CAPS Take 400 mg by mouth daily.    . Menthol-Methyl Salicylate (MUSCLE RUB) 10-15 % CREA Apply 1 application topically as needed for muscle pain (apply to leg as needed).    Marland Kitchen omeprazole (PRILOSEC) 40 MG capsule TAKE ONE CAPSULE BY MOUTH EVERY DAY 90 capsule 1  . polyethylene glycol (MIRALAX / GLYCOLAX) packet Take 17 g by mouth daily.    Marland Kitchen UNABLE TO FIND Med Name:  [facility] Lidocaine Cream over the counter as needed for joint and muscle pain     No current facility-administered medications on file prior to visit.      Past Medical History:  Diagnosis Date  . Allergy   . Anemia   . Arthritis    right hip is most painful and affects mobility as far as ambulating steps  . AVM (arteriovenous malformation)    of cecum  . Cataract   . Complication of anesthesia    after gallbladder surgery, had memory issues 2 days after for several days  . Diverticula of colon   . Duodenal ulcer 2015   x 4 nonbleeding  . Duodenitis   . Hearing loss   . Hiatal hernia   . Hypercalcemia 08/18/2013  . Hypertension   . Iron deficiency anemia, unspecified 12/25/2012   Allergies  Allergen Reactions  . Cephalexin Other (See Comments)    Affected pt's child -  Therefore , pt does not take Keflex.  . Clindamycin/Lincomycin   . Codeine Nausea And Vomiting  . Levofloxacin Other (See Comments)    Hallucinations.  . Morphine And Related Nausea And Vomiting  . Other     Another antibiotic that starts with cina?  . Quinolones Other (See Comments)    Hallucinations.  . Sulfa Antibiotics Nausea And Vomiting  . Tramadol Nausea And Vomiting  .  Amoxicillin Nausea Only  . Fexofenadine Hcl Other (See Comments)    unknown reaction  . Floxin [Ocuflox] Other (See Comments)    unknown allergy  . Neurontin [Gabapentin] Palpitations  . Septra [Bactrim] Other (See Comments)    Unknown reaction    Family History  Problem Relation Age of Onset  . Asthma Other   . Hypertension Other   . Hyperlipidemia Other   . Stroke Other   . Diabetes Mother   . Heart disease Mother   . Diverticulosis Mother   . Heart disease Father     Social History   Social History  . Marital status: Divorced    Spouse name: N/A  . Number of children: 3  . Years of education: N/A   Occupational History  . Retired    Social History Main Topics  .  Smoking status: Never Smoker  . Smokeless tobacco: Never Used  . Alcohol use No  . Drug use: No  . Sexual activity: Not Currently   Other Topics Concern  . None   Social History Narrative  . None    Vitals:   11/02/16 1253  BP: 140/80  Pulse: 83  Resp: 12   O2 sat at RA 98%.  Body mass index is 35.69 kg/m.   Physical Exam  Nursing note and vitals reviewed. Constitutional: She is oriented to person, place, and time. She appears well-developed. No distress.  HENT:  Head: Atraumatic.  Mouth/Throat: Oropharynx is clear and moist and mucous membranes are normal. Abnormal dentition.  Eyes: Conjunctivae and EOM are normal. Pupils are equal, round, and reactive to light.  Neck: No JVD present.  Cardiovascular: Normal rate and regular rhythm.   Murmur (? soft SEM RUSB) heard. Pulses:      Dorsalis pedis pulses are 2+ on the right side, and 2+ on the left side.  Respiratory: Effort normal and breath sounds normal. No respiratory distress.  GI: Soft. She exhibits no mass. There is no hepatomegaly. There is no tenderness.  Musculoskeletal: She exhibits no edema or tenderness.       Thoracic back: She exhibits no tenderness.       Lumbar back: She exhibits no tenderness.  Knee crepitus  bilateral. No effusion, mild limitation of flexion, no erythema.  Lymphadenopathy:    She has no cervical adenopathy.  Neurological: She is alert and oriented to person, place, and time. She has normal strength. Coordination normal.  Stable gait assisted with cane. SLR negative bilateral.  Skin: Skin is warm. No erythema.  Psychiatric: Her mood appears anxious.  Well groomed, good eye contact.      ASSESSMENT AND PLAN:   Melanie [name] was seen today for establish care.  Diagnoses and all orders for this visit:  Lab Results  Component Value Date   CREATININE 0.75 11/02/2016   BUN 18 11/02/2016   NA 140 11/02/2016   K 4.3 11/02/2016   CL 106 11/02/2016   CO2 28 11/02/2016    Osteoarthritis of multiple joints, unspecified osteoarthritis type  Stable problem. Continue OTC topical Icy Hot with Lidocaine, which seems to help. Fall precautions discussed. F/U as needed.  Hypercalcemia  Stable. Possible etiologes discussed. Further recommendations will be given according to lab results. Instructed about warning signs.  -     Basic metabolic panel -     Calcium, ionized -     VITAMIN D 25 Hydroxy (Vit-D Deficiency, Fractures)  Duodenal ulcer disease  Hx of A-V malformations. Asymptomatic. No changes in current management.  Essential hypertension  Otherwise adequately controlled. No changes in current management. DASH-low salt diet recommended. Eye exam recommended at least annually. F/U in 3-4 months, before if needed.  -     Basic metabolic panel -     metoprolol (LOPRESSOR) 50 MG tablet; Take 1 tablet (50 mg total) by mouth 2 (two) times daily.    1:04 pm to 1:51 pm > 50% of visit was dedicated to reassurance, since she does not have new complaints, she has not have symptoms that suggest a concerning acute problems. We discuss medication side effects, reviewed of PMHx, fall precautions, and plan of care. I do not see echo report, she has had a few EKG's done,  currently she is asymptomatic. I will review records and make recommendations accordingly.  I was planning 6 months follow up but she would  like 3 months follow up.     [name]. [name], MD  [facility]. [place] office.

## 2016-11-03 LAB — CALCIUM, IONIZED: CALCIUM ION: 6 mg/dL — AB (ref 4.8–5.6)

## 2016-11-03 LAB — VITAMIN D 25 HYDROXY (VIT D DEFICIENCY, FRACTURES): VITD: 15.28 ng/mL — AB (ref 30.00–100.00)

## 2016-11-04 ENCOUNTER — Telehealth: Payer: Self-pay | Admitting: Family Medicine

## 2016-11-04 DIAGNOSIS — E2839 Other primary ovarian failure: Secondary | ICD-10-CM

## 2016-11-04 NOTE — Telephone Encounter (Signed)
Pt would like an [facility] for [facility]  Pt wants to get at the same time as her mammogram  Fax;  [phone]  Pt wants to know if Vit D lab is back and if she needs a Rx for Vit D sent to [facility] [address]

## 2016-11-05 ENCOUNTER — Other Ambulatory Visit: Payer: Self-pay

## 2016-11-05 MED ORDER — VITAMIN D (ERGOCALCIFEROL) 1.25 MG (50000 UNIT) PO CAPS: 50000.0000 [IU] | ORAL_CAPSULE | ORAL | 1 refills | Status: DC

## 2016-11-05 NOTE — Telephone Encounter (Signed)
I called and spoke with patient. She said that she gets the bone density scans every other year, so it should be covered & would like to have it done.  In regards to other labs- she is making an appointment with Darl PikesSusan in 2 weeks, and can do them then.

## 2016-11-05 NOTE — Telephone Encounter (Signed)
Labs are back and sent to your inbox.  In regard to DEXA, she had one in 09/2014. I do not think her health insurance will cover one now. DEXA is usually recommended every 2-5 years depending of results.  Thanks, BJ

## 2016-11-05 NOTE — Telephone Encounter (Signed)
What dx for the dexa scan?  Do you have her vitamin D results back?

## 2016-11-06 ENCOUNTER — Other Ambulatory Visit: Payer: Self-pay | Admitting: Family Medicine

## 2016-11-06 NOTE — Telephone Encounter (Signed)
[facility]

## 2016-11-06 NOTE — Telephone Encounter (Signed)
DEXA can be ordered but please specified "upon pt request". You could use Estrogen deficiency state, she may need to sign a waiver because her insurance may not cover it.   Thanks, BJ

## 2016-11-19 ENCOUNTER — Other Ambulatory Visit: Payer: Self-pay | Admitting: Internal Medicine

## 2016-11-19 DIAGNOSIS — J984 Other disorders of lung: Secondary | ICD-10-CM

## 2016-11-20 ENCOUNTER — Telehealth: Payer: Self-pay | Admitting: Family Medicine

## 2016-11-20 ENCOUNTER — Telehealth: Payer: Self-pay | Admitting: Internal Medicine

## 2016-11-20 NOTE — Telephone Encounter (Signed)
° ° ° ° °  Pt call to ask if a copy of her lab she had on 11/02/16 be mailed to her.

## 2016-11-20 NOTE — Telephone Encounter (Signed)
Left message for pt that Dr. Rhea BeltonPyrtle is gone for the day and he will return on Monday. Message sent to Dr. Rhea BeltonPyrtle.

## 2016-11-20 NOTE — Telephone Encounter (Signed)
Labs placed in envelope to be mailed out to patient.

## 2016-11-23 ENCOUNTER — Ambulatory Visit
Admission: RE | Admit: 2016-11-23 | Discharge: 2016-11-23 | Disposition: A | Discharge status: Home / Self Care | Payer: Medicare Other | Source: Ambulatory Visit | Attending: Internal Medicine | Admitting: Internal Medicine

## 2016-11-23 DIAGNOSIS — J984 Other disorders of lung: Secondary | ICD-10-CM

## 2016-11-23 MED ORDER — IOPAMIDOL (ISOVUE-300) INJECTION 61%
75.0000 mL | Freq: Once | INTRAVENOUS | Status: AC | PRN
  Administered 2016-11-23: 75 mL via INTRAVENOUS

## 2016-11-23 NOTE — Telephone Encounter (Signed)
Pt called to let me know that her left-sided chest pain was having [facility] She reports CXR showed "mass" and CT chest was done. I reviewed this with her. Shows coronary artery calcification, large hiatal hernia, and fortunately no other abnormalities in the chest I reviewed results with her by phone and she has followup with them this Thursday to discuss further She thanked me for the call and will call back if/as needed

## 2016-11-24 ENCOUNTER — Other Ambulatory Visit: Payer: Self-pay | Admitting: Family Medicine

## 2016-11-24 ENCOUNTER — Other Ambulatory Visit: Payer: Medicare Other

## 2016-11-24 DIAGNOSIS — E2839 Other primary ovarian failure: Secondary | ICD-10-CM

## 2016-11-24 DIAGNOSIS — Z1231 Encounter for screening mammogram for malignant neoplasm of breast: Secondary | ICD-10-CM

## 2016-12-08 ENCOUNTER — Other Ambulatory Visit: Payer: Medicare Other

## 2016-12-08 ENCOUNTER — Ambulatory Visit: Payer: Medicare Other

## 2017-01-25 ENCOUNTER — Ambulatory Visit
Admission: RE | Admit: 2017-01-25 | Discharge: 2017-01-25 | Disposition: A | Discharge status: Home / Self Care | Payer: Medicare Other | Source: Ambulatory Visit | Attending: Family Medicine | Admitting: Family Medicine

## 2017-01-25 DIAGNOSIS — E2839 Other primary ovarian failure: Secondary | ICD-10-CM

## 2017-01-25 DIAGNOSIS — Z1231 Encounter for screening mammogram for malignant neoplasm of breast: Secondary | ICD-10-CM

## 2017-01-27 ENCOUNTER — Encounter: Payer: Self-pay | Admitting: Internal Medicine

## 2017-01-27 ENCOUNTER — Encounter: Payer: Self-pay | Admitting: Family Medicine

## 2017-01-27 DIAGNOSIS — M810 Age-related osteoporosis without current pathological fracture: Secondary | ICD-10-CM | POA: Insufficient documentation

## 2017-02-01 ENCOUNTER — Ambulatory Visit: Payer: Medicare Other | Admitting: Family Medicine

## 2017-04-16 ENCOUNTER — Other Ambulatory Visit: Payer: Self-pay | Admitting: Internal Medicine

## 2017-04-19 ENCOUNTER — Telehealth: Payer: Self-pay | Admitting: *Deleted

## 2017-04-19 NOTE — Telephone Encounter (Signed)
Pt left message to cancel lab appt for Tuesday

## 2017-04-20 ENCOUNTER — Other Ambulatory Visit: Payer: Medicare Other

## 2017-04-22 ENCOUNTER — Telehealth: Payer: Self-pay | Admitting: Hematology and Oncology

## 2017-04-22 NOTE — Telephone Encounter (Signed)
Left message for patient to call back a r/s missed lab appt from  7/10

## 2017-05-06 ENCOUNTER — Ambulatory Visit: Payer: Medicare Other | Admitting: Internal Medicine

## 2017-05-07 ENCOUNTER — Other Ambulatory Visit: Payer: Medicare Other

## 2017-05-21 ENCOUNTER — Telehealth: Payer: Self-pay | Admitting: *Deleted

## 2017-05-21 ENCOUNTER — Other Ambulatory Visit (HOSPITAL_BASED_OUTPATIENT_CLINIC_OR_DEPARTMENT_OTHER): Payer: Medicare Other

## 2017-05-21 ENCOUNTER — Other Ambulatory Visit: Payer: Self-pay | Admitting: Hematology and Oncology

## 2017-05-21 DIAGNOSIS — D509 Iron deficiency anemia, unspecified: Secondary | ICD-10-CM | POA: Diagnosis not present

## 2017-05-21 DIAGNOSIS — K5520 Angiodysplasia of colon without hemorrhage: Secondary | ICD-10-CM

## 2017-05-21 LAB — CBC WITH DIFFERENTIAL/PLATELET
BASO%: 0.4 % (ref 0.0–2.0)
Basophils Absolute: 0 10*3/uL (ref 0.0–0.1)
EOS ABS: 0.3 10*3/uL (ref 0.0–0.5)
EOS%: 4.6 % (ref 0.0–7.0)
HEMATOCRIT: 34.9 % (ref 34.8–46.6)
HEMOGLOBIN: 10.9 g/dL — AB (ref 11.6–15.9)
LYMPH#: 1.6 10*3/uL (ref 0.9–3.3)
LYMPH%: 29.4 % (ref 14.0–49.7)
MCH: 24.7 pg — AB (ref 25.1–34.0)
MCHC: 31.2 g/dL — ABNORMAL LOW (ref 31.5–36.0)
MCV: 79 fL — ABNORMAL LOW (ref 79.5–101.0)
MONO#: 0.4 10*3/uL (ref 0.1–0.9)
MONO%: 6.5 % (ref 0.0–14.0)
NEUT#: 3.2 10*3/uL (ref 1.5–6.5)
NEUT%: 59.1 % (ref 38.4–76.8)
Platelets: 331 10*3/uL (ref 145–400)
RBC: 4.42 10*6/uL (ref 3.70–5.45)
RDW: 15.6 % — AB (ref 11.2–14.5)
WBC: 5.4 10*3/uL (ref 3.9–10.3)

## 2017-05-21 LAB — FERRITIN: Ferritin: 7 ng/ml — ABNORMAL LOW (ref 9–269)

## 2017-05-21 NOTE — Telephone Encounter (Signed)
I can see her on 8/28: see me at 1030 am and IV iron on 8/28 If OK with her place scheduling msg Second dose might be delayed due to Labor day holiday

## 2017-05-21 NOTE — Telephone Encounter (Signed)
-----   Message from Artis DelayNi Gorsuch, MD sent at 05/21/2017 10:32 AM EDT ----- Regarding: labs Please tell her she has recurrent iron def anemia I can see her and start IV iron on 8/21 If acceptable to her, I can see her on 8/21 at 930 am, and IV iron same day and 8/28. Please place scheduling msg ----- Message ----- From: Interface, Lab In Three Zero One Sent: 05/21/2017   9:26 AM To: Artis DelayNi Gorsuch, MD

## 2017-05-21 NOTE — Telephone Encounter (Signed)
Pt will come on 8/28 @ 1030 with Iron afterwards. Msg to scheduler

## 2017-05-21 NOTE — Telephone Encounter (Signed)
LM with note below. Requested call back to confirm appts

## 2017-05-21 NOTE — Telephone Encounter (Signed)
Has appt with ENT on 8/21 and it takes "months" to get an appt. Is available any other days.

## 2017-06-08 ENCOUNTER — Ambulatory Visit (HOSPITAL_BASED_OUTPATIENT_CLINIC_OR_DEPARTMENT_OTHER): Payer: Medicare Other

## 2017-06-08 ENCOUNTER — Ambulatory Visit (HOSPITAL_BASED_OUTPATIENT_CLINIC_OR_DEPARTMENT_OTHER): Payer: Medicare Other | Admitting: Hematology and Oncology

## 2017-06-08 ENCOUNTER — Telehealth: Payer: Self-pay | Admitting: Hematology and Oncology

## 2017-06-08 VITALS — BP 139/66 | HR 95 | Temp 99.3°F | Resp 18 | Wt 198.3 lb

## 2017-06-08 DIAGNOSIS — K5520 Angiodysplasia of colon without hemorrhage: Secondary | ICD-10-CM

## 2017-06-08 DIAGNOSIS — D509 Iron deficiency anemia, unspecified: Secondary | ICD-10-CM | POA: Diagnosis present

## 2017-06-08 MED ORDER — SODIUM CHLORIDE 0.9 % IV SOLN
510.0000 mg | Freq: Once | INTRAVENOUS | Status: AC
  Administered 2017-06-08: 510 mg via INTRAVENOUS
  Filled 2017-06-08: qty 17

## 2017-06-08 MED ORDER — ACETAMINOPHEN 325 MG PO TABS
650.0000 mg | ORAL_TABLET | Freq: Four times a day (QID) | ORAL | Status: DC | PRN
  Administered 2017-06-08: 650 mg via ORAL

## 2017-06-08 MED ORDER — SODIUM CHLORIDE 0.9 % IV SOLN
Freq: Once | INTRAVENOUS | Status: AC
  Administered 2017-06-08: 12:00:00 via INTRAVENOUS

## 2017-06-08 MED ORDER — DIPHENHYDRAMINE HCL 50 MG/ML IJ SOLN
12.5000 mg | Freq: Once | INTRAMUSCULAR | Status: AC
Start: 2017-06-08 — End: 2017-06-08
  Administered 2017-06-08: 12.5 mg via INTRAVENOUS

## 2017-06-08 MED ORDER — DIPHENHYDRAMINE HCL 50 MG/ML IJ SOLN
INTRAMUSCULAR | Status: AC
  Filled 2017-06-08: qty 1

## 2017-06-08 MED ORDER — ACETAMINOPHEN 325 MG PO TABS
ORAL_TABLET | ORAL | Status: AC
  Filled 2017-06-08: qty 2

## 2017-06-08 NOTE — Patient Instructions (Signed)

## 2017-06-08 NOTE — Telephone Encounter (Signed)
Gave patient avs and calendar for September and November appointment

## 2017-06-09 ENCOUNTER — Telehealth: Payer: Self-pay | Admitting: *Deleted

## 2017-06-09 NOTE — Telephone Encounter (Signed)
[facility] for [facility] to see if they would be able to follow patient with weekly phone calls for wellness check.

## 2017-06-10 ENCOUNTER — Encounter: Payer: Self-pay | Admitting: Hematology and Oncology

## 2017-06-10 NOTE — Progress Notes (Signed)
[facility] OFFICE PROGRESS NOTE  Melanie [name], [name], MD SUMMARY OF HEMATOLOGIC HISTORY:  The patient has been complaining of fatigue and was found to have severe iron deficiency anemia. Approximately a year or 2 ago, she received 2 units of blood transfusion because of this. She had extensive GI evaluation with EGD and colonoscopy and was never found to have any sort of GI bleed. She has received 4 intravenous doses of iron infusion and the last infusion in 3 months ago. Bone marrow aspirate and biopsy in January 2013 showed no evidence of malignancy or persistent absence of iron stores. In 2014, she received 6 doses of intravenous iron infusion. She was referred to gastroenterology for further evaluation due to suspected GI bleed. Repeat GI endoscopy dated 12/26/2013 showed angiodysplastic lesion and moderate diverticulosis. The patient had received numerous intravenous iron infusion almost on a monthly basis from 03/15/2013 to 01/17/15. Her last intravenous iron was in July 2016 INTERVAL HISTORY: Melanie [name] 82 y.o. [name] returns for further follow-up. She complain of fatigue. The patient denies any recent signs or symptoms of bleeding such as spontaneous epistaxis, hematuria or hematochezia. She shares significant amount of time during the visit to explain her current living situation. She has lost her spouse.  She does not have friends and neighbors of family members to keep a tab on her. She denies recent fall She prepares all meals Otherwise, she feels well  I have reviewed the past medical history, past surgical history, social history and family history with the patient and they are unchanged from previous note.  ALLERGIES:  is allergic to cephalexin; clindamycin/lincomycin; codeine; levofloxacin; morphine and related; other; quinolones; sulfa antibiotics; tramadol; amoxicillin; fexofenadine hcl; floxin [ocuflox]; neurontin [gabapentin]; and septra  [bactrim].  MEDICATIONS:  Current Outpatient Prescriptions  Medication Sig Dispense Refill  . aspirin 81 MG tablet Take 81 mg by mouth daily.     . Cyanocobalamin (VITAMIN B12 PO) Take 1,500 mcg by mouth daily.    . fluticasone (FLONASE) 50 MCG/ACT nasal spray Place 1 spray into both nostrils daily.    . folic acid (FOLVITE) 1 MG tablet Take 1 tablet (1 mg total) by mouth daily. 90 tablet 3  . Magnesium 400 MG CAPS Take 400 mg by mouth daily.    . Menthol-Methyl Salicylate (MUSCLE RUB) 10-15 % CREA Apply 1 application topically as needed for muscle pain (apply to leg as needed).    . metoprolol (LOPRESSOR) 50 MG tablet Take 1 tablet (50 mg total) by mouth 2 (two) times daily. 180 tablet 1  . omeprazole (PRILOSEC) 40 MG capsule TAKE ONE CAPSULE BY MOUTH EVERY DAY 90 capsule 0  . polyethylene glycol (MIRALAX / GLYCOLAX) packet Take 17 [name] by mouth daily.    Marland Kitchen. UNABLE TO FIND Med Name:  [facility] Lidocaine Cream over the counter as needed for joint and muscle pain    . Vitamin D, Ergocalciferol, (DRISDOL) 50000 units CAPS capsule Take 1 capsule (50,000 Units total) by mouth every 7 (seven) days. 4 capsule 1   No current facility-administered medications for this visit.      REVIEW OF SYSTEMS:   Constitutional: Denies fevers, chills or night sweats Eyes: Denies blurriness of vision Ears, nose, mouth, throat, and face: Denies mucositis or sore throat Respiratory: Denies cough, dyspnea or wheezes Cardiovascular: Denies palpitation, chest discomfort or lower extremity swelling Gastrointestinal:  Denies nausea, heartburn or change in bowel habits Skin: Denies abnormal skin rashes Lymphatics: Denies new lymphadenopathy or easy bruising  Neurological:Denies numbness, tingling or new weaknesses Behavioral/Psych: Mood is stable, no new changes  All other systems were reviewed with the patient and are negative.  PHYSICAL EXAMINATION: ECOG PERFORMANCE STATUS: 0 - Asymptomatic  Vitals:   06/08/17  1006  BP: 139/66  Pulse: 95  Resp: 18  Temp: 99.3 F (37.4 C)  SpO2: 98%   Filed Weights   06/08/17 1006  Weight: 198 lb 4.8 oz (89.9 kg)    GENERAL:alert, no distress and comfortable SKIN: skin color, texture, turgor are normal, no rashes or significant lesions EYES: normal, Conjunctiva are pink and non-injected, sclera clear Musculoskeletal:no cyanosis of digits and no clubbing  NEURO: alert & oriented x 3 with fluent speech, no focal motor/sensory deficits  LABORATORY DATA:  I have reviewed the data as listed     Component Value Date/Time   NA 140 11/02/2016 1403   NA 144 02/12/2014 0849   K 4.3 11/02/2016 1403   K 3.6 02/12/2014 0849   CL 106 11/02/2016 1403   CL 109 (H) 12/23/2012 1100   CO2 28 11/02/2016 1403   CO2 24 02/12/2014 0849   GLUCOSE 99 11/02/2016 1403   GLUCOSE 152 (H) 02/12/2014 0849   GLUCOSE 106 (H) 12/23/2012 1100   BUN 18 11/02/2016 1403   BUN 15.4 02/12/2014 0849   CREATININE 0.75 11/02/2016 1403   CREATININE 0.8 02/12/2014 0849   CALCIUM 10.6 (H) 11/02/2016 1403   CALCIUM 10.7 (H) 02/12/2014 0849   PROT 7.1 08/22/2015 1041   PROT 6.3 (L) 02/12/2014 0849   ALBUMIN 4.1 08/22/2015 1041   ALBUMIN 3.4 (L) 02/12/2014 0849   AST 17 08/22/2015 1041   AST 13 02/12/2014 0849   ALT 16 08/22/2015 1041   ALT 8 02/12/2014 0849   ALKPHOS 89 08/22/2015 1041   ALKPHOS 78 02/12/2014 0849   BILITOT 0.5 08/22/2015 1041   BILITOT 0.25 02/12/2014 0849   GFRNONAA 80 (L) 08/22/2011 1205   GFRAA >90 08/22/2011 1205    No results found for: SPEP, UPEP  Lab Results  Component Value Date   WBC 5.4 05/21/2017   NEUTROABS 3.2 05/21/2017   HGB 10.9 (L) 05/21/2017   HCT 34.9 05/21/2017   MCV 79.0 (L) 05/21/2017   PLT 331 05/21/2017      Chemistry      Component Value Date/Time   NA 140 11/02/2016 1403   NA 144 02/12/2014 0849   K 4.3 11/02/2016 1403   K 3.6 02/12/2014 0849   CL 106 11/02/2016 1403   CL 109 (H) 12/23/2012 1100   CO2 28 11/02/2016  1403   CO2 24 02/12/2014 0849   BUN 18 11/02/2016 1403   BUN 15.4 02/12/2014 0849   CREATININE 0.75 11/02/2016 1403   CREATININE 0.8 02/12/2014 0849      Component Value Date/Time   CALCIUM 10.6 (H) 11/02/2016 1403   CALCIUM 10.7 (H) 02/12/2014 0849   ALKPHOS 89 08/22/2015 1041   ALKPHOS 78 02/12/2014 0849   AST 17 08/22/2015 1041   AST 13 02/12/2014 0849   ALT 16 08/22/2015 1041   ALT 8 02/12/2014 0849   BILITOT 0.5 08/22/2015 1041   BILITOT 0.25 02/12/2014 0849      ASSESSMENT & PLAN:  Iron deficiency anemia The most likely cause of her anemia is due to chronic blood loss/malabsorption syndrome. She had extensive investigations by gastroenterologist. The patient has stable CBC for over a year and has not received any intravenous iron infusion for a while She could not tolerate oral  iron in the past The most likely cause of her anemia is due to chronic blood loss/malabsorption syndrome. We discussed some of the risks, benefits, and alternatives of intravenous iron infusions. The patient is symptomatic from anemia and the iron level is critically low. She tolerated oral iron supplement poorly and desires to achieved higher levels of iron faster for adequate hematopoesis. Some of the side-effects to be expected including risks of infusion reactions, phlebitis, headaches, nausea and fatigue.  The patient is willing to proceed. Patient education material was dispensed.  Goal is to keep ferritin level greater than 50 I plan to recheck her blood count again in a few months after IV iron.    Orders Placed This Encounter  Procedures  . CBC with Differential/Platelet    Standing Status:   Standing    Number of Occurrences:   9    Standing Expiration Date:   06/08/2018  . Ferritin    Standing Status:   Standing    Number of Occurrences:   9    Standing Expiration Date:   06/08/2018    All questions were answered. The patient knows to call the clinic with any problems, questions or  concerns. No barriers to learning was detected.  I spent 10 minutes counseling the patient face to face. The total time spent in the appointment was 15 minutes and more than 50% was on counseling.     [name], MD 8/30/20188:08 AM

## 2017-06-10 NOTE — Assessment & Plan Note (Signed)
The most likely cause of her anemia is due to chronic blood loss/malabsorption syndrome. She had extensive investigations by gastroenterologist. The patient has stable CBC for over a year and has not received any intravenous iron infusion for a while She could not tolerate oral iron in the past The most likely cause of her anemia is due to chronic blood loss/malabsorption syndrome. We discussed some of the risks, benefits, and alternatives of intravenous iron infusions. The patient is symptomatic from anemia and the iron level is critically low. She tolerated oral iron supplement poorly and desires to achieved higher levels of iron faster for adequate hematopoesis. Some of the side-effects to be expected including risks of infusion reactions, phlebitis, headaches, nausea and fatigue.  The patient is willing to proceed. Patient education material was dispensed.  Goal is to keep ferritin level greater than 50 I plan to recheck her blood count again in a few months after IV iron.

## 2017-06-18 ENCOUNTER — Ambulatory Visit (HOSPITAL_BASED_OUTPATIENT_CLINIC_OR_DEPARTMENT_OTHER): Payer: Medicare Other

## 2017-06-18 VITALS — BP 125/78 | HR 75 | Temp 97.5°F | Resp 18

## 2017-06-18 DIAGNOSIS — K5520 Angiodysplasia of colon without hemorrhage: Secondary | ICD-10-CM

## 2017-06-18 DIAGNOSIS — D509 Iron deficiency anemia, unspecified: Secondary | ICD-10-CM | POA: Diagnosis present

## 2017-06-18 MED ORDER — DIPHENHYDRAMINE HCL 50 MG/ML IJ SOLN
12.5000 mg | Freq: Once | INTRAMUSCULAR | Status: AC
  Administered 2017-06-18: 12.5 mg via INTRAVENOUS

## 2017-06-18 MED ORDER — ACETAMINOPHEN 325 MG PO TABS
650.0000 mg | ORAL_TABLET | Freq: Four times a day (QID) | ORAL | Status: DC | PRN
  Administered 2017-06-18: 650 mg via ORAL

## 2017-06-18 MED ORDER — ACETAMINOPHEN 325 MG PO TABS
ORAL_TABLET | ORAL | Status: AC
  Filled 2017-06-18: qty 2

## 2017-06-18 MED ORDER — DIPHENHYDRAMINE HCL 50 MG/ML IJ SOLN
INTRAMUSCULAR | Status: AC
  Filled 2017-06-18: qty 1

## 2017-06-18 MED ORDER — SODIUM CHLORIDE 0.9 % IV SOLN
Freq: Once | INTRAVENOUS | Status: AC
  Administered 2017-06-18: 14:00:00 via INTRAVENOUS

## 2017-06-18 MED ORDER — SODIUM CHLORIDE 0.9 % IV SOLN
510.0000 mg | Freq: Once | INTRAVENOUS | Status: AC
  Administered 2017-06-18: 510 mg via INTRAVENOUS
  Filled 2017-06-18: qty 17

## 2017-06-18 NOTE — Patient Instructions (Signed)

## 2017-07-13 ENCOUNTER — Telehealth: Payer: Self-pay

## 2017-07-13 ENCOUNTER — Other Ambulatory Visit: Payer: Self-pay | Admitting: Internal Medicine

## 2017-07-13 NOTE — Telephone Encounter (Signed)
Called with below message. 

## 2017-07-13 NOTE — Telephone Encounter (Signed)
OK for pneumonia shots. She should double check with prior PCP/pharmacy to make sure she did not have it already; most patients get them at age 80

## 2017-07-13 NOTE — Telephone Encounter (Signed)
Patient called and asked if was okay to get the flu shot. Told her that Dr. Bertis Ruddy encouraged all her patients to get the flu shot.  She wants to know if it would be okay to get the pneumonia shot or what is Dr. Bertis Ruddy thoughts on that vaccine.

## 2017-07-21 ENCOUNTER — Encounter (INDEPENDENT_AMBULATORY_CARE_PROVIDER_SITE_OTHER): Payer: Self-pay

## 2017-07-21 ENCOUNTER — Ambulatory Visit (INDEPENDENT_AMBULATORY_CARE_PROVIDER_SITE_OTHER): Payer: Medicare Other | Admitting: Internal Medicine

## 2017-07-21 ENCOUNTER — Encounter: Payer: Self-pay | Admitting: Internal Medicine

## 2017-07-21 VITALS — BP 128/80 | HR 74 | Ht 65.0 in | Wt 194.0 lb

## 2017-07-21 DIAGNOSIS — D500 Iron deficiency anemia secondary to blood loss (chronic): Secondary | ICD-10-CM

## 2017-07-21 DIAGNOSIS — K5909 Other constipation: Secondary | ICD-10-CM | POA: Diagnosis not present

## 2017-07-21 DIAGNOSIS — K219 Gastro-esophageal reflux disease without esophagitis: Secondary | ICD-10-CM

## 2017-07-21 MED ORDER — POLYETHYLENE GLYCOL 3350 17 GM/SCOOP PO POWD: 17.0000 g | Freq: Every day | ORAL | 1 refills | Status: DC

## 2017-07-21 MED ORDER — POLYETHYLENE GLYCOL 3350 17 GM/SCOOP PO POWD: 1.0000 | Freq: Every day | ORAL | 1 refills | Status: DC

## 2017-07-21 MED ORDER — OMEPRAZOLE 40 MG PO CPDR: 40.0000 mg | DELAYED_RELEASE_CAPSULE | Freq: Every day | ORAL | 1 refills | Status: DC

## 2017-07-21 NOTE — Addendum Note (Signed)
Addended by: Richardson Chiquito on: 07/21/2017 04:54 PM   Modules accepted: Orders

## 2017-07-21 NOTE — Progress Notes (Signed)
Subjective:    Patient ID: Melanie [name], female    DOB: [birth year], 82 y.o.   MRN: [id number]  HPI Oberia Beaudoin (formerly Verdie Mosher) is an 82 year old female with a history of IDA, hiatal hernia, history of H. pylori negative duodenal ulcer, colonic angiodysplasia treated with APC who is here for follow-up. She was last seen in August 2017. She is followed by Dr. Bertis Ruddy with hematology where she has received IV iron infusion. She was found to be anemic with recurrent significant iron deficiency. She received 2 doses of IV iron recently on 06/08/2017 and 06/18/2017. Since this time she has been feeling better.  She reports that her right upper quadrant pain has improved. It is still there on occasion but overall this has been better. She's not having heartburn or trouble swallowing. She is using omeprazole 40 mg daily and with this denies heartburn. She does have heartburn if this medication is stopped. She continues MiraLAX daily which helps for a constipation. She denies blood in her stool and melena.  She has repeat labs scheduled for September 08, 2017 at hematologist office.   Review of Systems As per history of present illness, otherwise negative  Current Medications, Allergies, Past Medical History, Past Surgical History, Family History and Social History were reviewed in [employer] record.     Objective:   Physical Exam BP 128/80   Pulse 74   Ht  (1.651 m)   Wt 194 lb (88 kg)   BMI 32.28 kg/m  Constitutional: Well-developed and well-nourished. No distress. HEENT: Normocephalic and atraumatic.  Conjunctivae are normal.  No scleral icterus. Neck: Neck supple. Trachea midline. Cardiovascular: Normal rate, regular rhythm and intact distal pulses.  Pulmonary/chest: Effort normal and breath sounds normal. No wheezing, rales or rhonchi. Abdominal: Soft, nontender, nondistended. Bowel sounds active throughout. Extremities: no clubbing,  cyanosis, or edema Neurological: Alert and oriented to person place and time. Skin: Skin is warm and dry. Psychiatric: Normal mood and affect. Behavior is normal.  CBC    Component Value Date/Time   WBC 5.4 05/21/2017 0908   WBC 5.3 11/12/2011 0745   RBC 4.42 05/21/2017 0908   RBC 3.32 (L) 01/10/2015 1150   RBC 4.52 11/12/2011 0745   HGB 10.9 (L) 05/21/2017 0908   HCT 34.9 05/21/2017 0908   PLT 331 05/21/2017 0908   MCV 79.0 (L) 05/21/2017 0908   MCH 24.7 (L) 05/21/2017 0908   MCH 22.3 (L) 11/12/2011 0745   MCHC 31.2 (L) 05/21/2017 0908   MCHC 30.5 11/12/2011 0745   RDW 15.6 (H) 05/21/2017 0908   LYMPHSABS 1.6 05/21/2017 0908   MONOABS 0.4 05/21/2017 0908   EOSABS 0.3 05/21/2017 0908   BASOSABS 0.0 05/21/2017 0908   Iron/TIBC/Ferritin/ %Sat    Component Value Date/Time   IRON 116 04/09/2015 1029   TIBC 305 04/09/2015 1029   FERRITIN 7 (L) 05/21/2017 0908   IRONPCTSAT 38 04/09/2015 1029   IRONPCTSAT 8 (L) 12/23/2012 1101      Assessment & Plan:   82 year old female with a history of IDA, hiatal hernia, history of H. pylori negative duodenal ulcer, colonic angiodysplasia treated with APC who is here for follow-up.  1. IDA -- Likely from chronic GI blood loss. Improved with IV iron in the past after being intolerant to oral iron. I expect she will need intermittent chronic lifelong IV iron. We have modified her risk factors including treating her peptic ulcer disease and ablating her angiodysplastic lesions found during endoscopy. --IV  iron recently, she will have follow-up labs and follow-up with hematology next month  2. GERD with history of PUD -- no symptoms to warrant further endoscopy. Continue omeprazole 40 mg daily.  3. Constipation -- mild and improved with daily MiraLAX. Continue MiraLAX 17 g daily  6-12 month follow-up, sooner if needed 15 minutes spent with the patient today. Greater than 50% was spent in counseling and coordination of care with the  patient

## 2017-07-21 NOTE — Patient Instructions (Addendum)
If you are age 80 or older, your body mass index should be between 23-30. Your Body mass index is 32.28 kg/m. If this is out of the aforementioned range listed, please consider follow up with your Primary Care Provider.  If you are age 63 or younger, your body mass index should be between 19-25. Your Body mass index is 32.28 kg/m. If this is out of the aformentioned range listed, please consider follow up with your Primary Care Provider.   We have sent the following medications to your pharmacy for you to pick up at your convenience: Miralax Omeprazole  Please follow up with Dr. Rhea Belton in 6 months. We will contact you by letter when it is time to schedule this.

## 2017-08-09 ENCOUNTER — Telehealth: Payer: Self-pay | Admitting: Internal Medicine

## 2017-08-09 NOTE — Telephone Encounter (Signed)
Pt aware.

## 2017-08-09 NOTE — Telephone Encounter (Signed)
Ok to try Please reassure patient we will watch her iron studies and blood counts closely (she recently sent me a letter). She also has received IV iron

## 2017-08-09 NOTE — Telephone Encounter (Signed)
Pt calling stating that the urologist has placed her on Vesicare. She is concerned because the papers from the pharmacy stated do not take if you have ulcer disease or slow emptying of stomach. Pt wants to know if Dr. Rhea BeltonPyrtle thinks it is ok for her to take. Please advise.

## 2017-09-08 ENCOUNTER — Other Ambulatory Visit (HOSPITAL_BASED_OUTPATIENT_CLINIC_OR_DEPARTMENT_OTHER): Payer: Medicare Other

## 2017-09-08 DIAGNOSIS — D509 Iron deficiency anemia, unspecified: Secondary | ICD-10-CM | POA: Diagnosis present

## 2017-09-08 LAB — CBC WITH DIFFERENTIAL/PLATELET
BASO%: 0.8 % (ref 0.0–2.0)
BASOS ABS: 0 10*3/uL (ref 0.0–0.1)
EOS ABS: 0.2 10*3/uL (ref 0.0–0.5)
EOS%: 3.7 % (ref 0.0–7.0)
HEMATOCRIT: 33 % — AB (ref 34.8–46.6)
HEMOGLOBIN: 10.3 g/dL — AB (ref 11.6–15.9)
LYMPH%: 31.8 % (ref 14.0–49.7)
MCH: 24.7 pg — AB (ref 25.1–34.0)
MCHC: 31.1 g/dL — AB (ref 31.5–36.0)
MCV: 79.2 fL — AB (ref 79.5–101.0)
MONO#: 0.4 10*3/uL (ref 0.1–0.9)
MONO%: 8.5 % (ref 0.0–14.0)
NEUT#: 2.7 10*3/uL (ref 1.5–6.5)
NEUT%: 55.2 % (ref 38.4–76.8)
Platelets: 393 10*3/uL (ref 145–400)
RBC: 4.16 10*6/uL (ref 3.70–5.45)
RDW: 18.3 % — AB (ref 11.2–14.5)
WBC: 4.8 10*3/uL (ref 3.9–10.3)
lymph#: 1.5 10*3/uL (ref 0.9–3.3)

## 2017-09-08 LAB — FERRITIN: Ferritin: 7 ng/ml — ABNORMAL LOW (ref 9–269)

## 2017-09-09 ENCOUNTER — Telehealth: Payer: Self-pay | Admitting: Hematology and Oncology

## 2017-09-09 NOTE — Telephone Encounter (Signed)
Scheduled appt per 11/28 sch message -  Patient is aware of appt date and time. [facility] .

## 2017-09-13 ENCOUNTER — Telehealth: Payer: Self-pay | Admitting: Hematology and Oncology

## 2017-09-13 NOTE — Telephone Encounter (Signed)
Scheduled appt per 11/29 sch message - patient is aware of appt added for f/u

## 2017-09-17 ENCOUNTER — Ambulatory Visit (HOSPITAL_BASED_OUTPATIENT_CLINIC_OR_DEPARTMENT_OTHER): Payer: Medicare Other | Admitting: Hematology and Oncology

## 2017-09-17 ENCOUNTER — Ambulatory Visit (HOSPITAL_BASED_OUTPATIENT_CLINIC_OR_DEPARTMENT_OTHER): Payer: Medicare Other

## 2017-09-17 ENCOUNTER — Encounter: Payer: Self-pay | Admitting: Hematology and Oncology

## 2017-09-17 ENCOUNTER — Telehealth: Payer: Self-pay | Admitting: Hematology and Oncology

## 2017-09-17 VITALS — BP 129/64 | HR 79 | Temp 97.7°F | Resp 20

## 2017-09-17 DIAGNOSIS — D509 Iron deficiency anemia, unspecified: Secondary | ICD-10-CM

## 2017-09-17 DIAGNOSIS — K5520 Angiodysplasia of colon without hemorrhage: Secondary | ICD-10-CM

## 2017-09-17 MED ORDER — ACETAMINOPHEN 160 MG/5ML PO SOLN
650.0000 mg | Freq: Once | ORAL | Status: AC
  Administered 2017-09-17: 650 mg via ORAL
  Filled 2017-09-17: qty 20.3

## 2017-09-17 MED ORDER — FERUMOXYTOL INJECTION 510 MG/17 ML
510.0000 mg | Freq: Once | INTRAVENOUS | Status: AC
  Administered 2017-09-17: 510 mg via INTRAVENOUS
  Filled 2017-09-17: qty 17

## 2017-09-17 MED ORDER — DIPHENHYDRAMINE HCL 50 MG/ML IJ SOLN
INTRAMUSCULAR | Status: AC
  Filled 2017-09-17: qty 1

## 2017-09-17 MED ORDER — ACETAMINOPHEN 325 MG PO TABS: 650.0000 mg | ORAL_TABLET | Freq: Four times a day (QID) | ORAL | Status: DC | PRN

## 2017-09-17 MED ORDER — DIPHENHYDRAMINE HCL 50 MG/ML IJ SOLN
12.5000 mg | Freq: Once | INTRAMUSCULAR | Status: AC
  Administered 2017-09-17: 12.5 mg via INTRAVENOUS

## 2017-09-17 NOTE — Assessment & Plan Note (Signed)
The most likely cause of her anemia is due to chronic blood loss/malabsorption syndrome. She had extensive investigations by gastroenterologist. She could not tolerate oral iron in the past The most likely cause of her anemia is due to chronic blood loss/malabsorption syndrome. We discussed some of the risks, benefits, and alternatives of intravenous iron infusions. The patient is symptomatic from anemia and the iron level is critically low. She tolerated oral iron supplement poorly and desires to achieved higher levels of iron faster for adequate hematopoesis. Some of the side-effects to be expected including risks of infusion reactions, phlebitis, headaches, nausea and fatigue.  The patient is willing to proceed. Patient education material was dispensed.  Goal is to keep ferritin level greater than 50 I plan to recheck her blood count again in a few months after IV iron.

## 2017-09-17 NOTE — Progress Notes (Signed)
[facility] OFFICE PROGRESS NOTE  [name], [name], MD SUMMARY OF HEMATOLOGIC HISTORY:  The patient has been complaining of fatigue and was found to have severe iron deficiency anemia. Approximately a year or 2 ago, she received 2 units of blood transfusion because of this. She had extensive GI evaluation with EGD and colonoscopy and was never found to have any sort of GI bleed. She has received 4 intravenous doses of iron infusion and the last infusion in 3 months ago. Bone marrow aspirate and biopsy in January 2013 showed no evidence of malignancy or persistent absence of iron stores. In 2014, she received 6 doses of intravenous iron infusion. She was referred to gastroenterology for further evaluation due to suspected GI bleed. Repeat GI endoscopy dated 12/26/2013 showed angiodysplastic lesion and moderate diverticulosis. The patient had received numerous intravenous iron infusion almost on a monthly basis from 03/15/2013 to 01/17/15. Her last intravenous iron was in July 2016, and then resumed in 2018 INTERVAL HISTORY: Melanie [name] 82 y.o. female returns for further follow-up. The patient denies any recent signs or symptoms of bleeding such as spontaneous epistaxis, hematuria or hematochezia. She complained of recent urinary symptoms and is being evaluated by urologist She appears to have difficulties keeping appointment to see other specialists for various reasons. She denies pica She have memory concentration difficulties.  Denies chest pain or shortness of breath with her anemia  I have reviewed the past medical history, past surgical history, social history and family history with the patient and they are unchanged from previous note.  ALLERGIES:  is allergic to cephalexin; clindamycin/lincomycin; codeine; levofloxacin; morphine and related; other; quinolones; sulfa antibiotics; tramadol; amoxicillin; fexofenadine hcl; floxin [ocuflox]; neurontin [gabapentin]; and septra  [bactrim].  MEDICATIONS:  Current Outpatient Medications  Medication Sig Dispense Refill  . magnesium oxide (MAG-OX) 400 MG tablet Take 400 mg by mouth daily.    Marland Kitchen. aspirin 81 MG tablet Take 81 mg by mouth daily.     . Cyanocobalamin (VITAMIN B12 PO) Take 500 mcg by mouth daily.     . folic acid (FOLVITE) 1 MG tablet Take 1 tablet (1 mg total) by mouth daily. 90 tablet 3  . Menthol-Methyl Salicylate (MUSCLE RUB) 10-15 % CREA Apply 1 application topically as needed for muscle pain (apply to leg as needed).    . metoprolol (LOPRESSOR) 50 MG tablet Take 1 tablet (50 mg total) by mouth 2 (two) times daily. 180 tablet 1  . omeprazole (PRILOSEC) 40 MG capsule Take 1 capsule (40 mg total) by mouth daily. 90 capsule 1  . polyethylene glycol powder (GLYCOLAX/MIRALAX) powder Take 17 [name] by mouth daily. 1051 [name] 1  . UNABLE TO FIND Med Name:  [facility] Hot Lidocaine Cream over the counter as needed for joint and muscle pain     No current facility-administered medications for this visit.    Facility-Administered Medications Ordered in Other Visits  Medication Dose Route Frequency Provider Last Rate Last Dose  . ferumoxytol (FERAHEME) 510 mg in sodium chloride 0.9 % 100 mL IVPB  510 mg Intravenous Once Artis DelayGorsuch, Jettie Lazare, MD   510 mg at 09/17/17 1319     REVIEW OF SYSTEMS:   Constitutional: Denies fevers, chills or night sweats Eyes: Denies blurriness of vision Ears, nose, mouth, throat, and face: Denies mucositis or sore throat Respiratory: Denies cough, dyspnea or wheezes Cardiovascular: Denies palpitation, chest discomfort or lower extremity swelling Gastrointestinal:  Denies nausea, heartburn or change in bowel habits Skin: Denies abnormal skin rashes  Lymphatics: Denies new lymphadenopathy or easy bruising Neurological:Denies numbness, tingling or new weaknesses Behavioral/Psych: Mood is stable, no new changes  All other systems were reviewed with the patient and are negative.  PHYSICAL EXAMINATION: ECOG  PERFORMANCE STATUS: 1 - Symptomatic but completely ambulatory  Vitals:   09/17/17 1140  BP: 121/76  Pulse: 81  Resp: (!) 24  Temp: 98.4 F (36.9 C)  SpO2: 100%   Filed Weights   09/17/17 1140  Weight: 189 lb 14.4 oz (86.1 kg)    GENERAL:alert, no distress and comfortable SKIN: skin color is pale, texture, turgor are normal, no rashes or significant lesions EYES: normal, Conjunctiva are pink and non-injected, sclera clear OROPHARYNX:no exudate, no erythema and lips, buccal mucosa, and tongue normal  NECK: supple, thyroid normal size, non-tender, without nodularity LYMPH:  no palpable lymphadenopathy in the cervical, axillary or inguinal LUNGS: clear to auscultation and percussion with normal breathing effort HEART: regular rate & rhythm and no murmurs and no lower extremity edema ABDOMEN:abdomen soft, non-tender and normal bowel sounds Musculoskeletal:no cyanosis of digits and no clubbing  NEURO: alert & oriented x 3 with fluent speech, no focal motor/sensory deficits  LABORATORY DATA:  I have reviewed the data as listed     Component Value Date/Time   NA 140 11/02/2016 1403   NA 144 02/12/2014 0849   K 4.3 11/02/2016 1403   K 3.6 02/12/2014 0849   CL 106 11/02/2016 1403   CL 109 (H) 12/23/2012 1100   CO2 28 11/02/2016 1403   CO2 24 02/12/2014 0849   GLUCOSE 99 11/02/2016 1403   GLUCOSE 152 (H) 02/12/2014 0849   GLUCOSE 106 (H) 12/23/2012 1100   BUN 18 11/02/2016 1403   BUN 15.4 02/12/2014 0849   CREATININE 0.75 11/02/2016 1403   CREATININE 0.8 02/12/2014 0849   CALCIUM 10.6 (H) 11/02/2016 1403   CALCIUM 10.7 (H) 02/12/2014 0849   PROT 7.1 08/22/2015 1041   PROT 6.3 (L) 02/12/2014 0849   ALBUMIN 4.1 08/22/2015 1041   ALBUMIN 3.4 (L) 02/12/2014 0849   AST 17 08/22/2015 1041   AST 13 02/12/2014 0849   ALT 16 08/22/2015 1041   ALT 8 02/12/2014 0849   ALKPHOS 89 08/22/2015 1041   ALKPHOS 78 02/12/2014 0849   BILITOT 0.5 08/22/2015 1041   BILITOT 0.25 02/12/2014  0849   GFRNONAA 80 (L) 08/22/2011 1205   GFRAA >90 08/22/2011 1205    No results found for: SPEP, UPEP  Lab Results  Component Value Date   WBC 4.8 09/08/2017   NEUTROABS 2.7 09/08/2017   HGB 10.3 (L) 09/08/2017   HCT 33.0 (L) 09/08/2017   MCV 79.2 (L) 09/08/2017   PLT 393 09/08/2017      Chemistry      Component Value Date/Time   NA 140 11/02/2016 1403   NA 144 02/12/2014 0849   K 4.3 11/02/2016 1403   K 3.6 02/12/2014 0849   CL 106 11/02/2016 1403   CL 109 (H) 12/23/2012 1100   CO2 28 11/02/2016 1403   CO2 24 02/12/2014 0849   BUN 18 11/02/2016 1403   BUN 15.4 02/12/2014 0849   CREATININE 0.75 11/02/2016 1403   CREATININE 0.8 02/12/2014 0849      Component Value Date/Time   CALCIUM 10.6 (H) 11/02/2016 1403   CALCIUM 10.7 (H) 02/12/2014 0849   ALKPHOS 89 08/22/2015 1041   ALKPHOS 78 02/12/2014 0849   AST 17 08/22/2015 1041   AST 13 02/12/2014 0849   ALT 16 08/22/2015 1041  ALT 8 02/12/2014 0849   BILITOT 0.5 08/22/2015 1041   BILITOT 0.25 02/12/2014 0849      ASSESSMENT & PLAN:  Iron deficiency anemia The most likely cause of her anemia is due to chronic blood loss/malabsorption syndrome. She had extensive investigations by gastroenterologist. She could not tolerate oral iron in the past The most likely cause of her anemia is due to chronic blood loss/malabsorption syndrome. We discussed some of the risks, benefits, and alternatives of intravenous iron infusions. The patient is symptomatic from anemia and the iron level is critically low. She tolerated oral iron supplement poorly and desires to achieved higher levels of iron faster for adequate hematopoesis. Some of the side-effects to be expected including risks of infusion reactions, phlebitis, headaches, nausea and fatigue.  The patient is willing to proceed. Patient education material was dispensed.  Goal is to keep ferritin level greater than 50 I plan to recheck her blood count again in a few months  after IV iron.    No orders of the defined types were placed in this encounter.   All questions were answered. The patient knows to call the clinic with any problems, questions or concerns. No barriers to learning was detected.  I spent 10 minutes counseling the patient face to face. The total time spent in the appointment was 15 minutes and more than 50% was on counseling.     Artis DelayNi Poppi Scantling, MD 12/7/20181:31 PM

## 2017-09-17 NOTE — Patient Instructions (Signed)

## 2017-09-17 NOTE — Telephone Encounter (Signed)
Gave avs and calendar for December and February 2019 °

## 2017-10-01 ENCOUNTER — Ambulatory Visit (HOSPITAL_BASED_OUTPATIENT_CLINIC_OR_DEPARTMENT_OTHER): Payer: Medicare Other

## 2017-10-01 VITALS — BP 135/72 | HR 72 | Temp 97.7°F | Resp 18

## 2017-10-01 DIAGNOSIS — K5520 Angiodysplasia of colon without hemorrhage: Secondary | ICD-10-CM

## 2017-10-01 DIAGNOSIS — D509 Iron deficiency anemia, unspecified: Secondary | ICD-10-CM

## 2017-10-01 MED ORDER — ACETAMINOPHEN 325 MG PO TABS: 650.0000 mg | ORAL_TABLET | Freq: Once | ORAL | Status: DC

## 2017-10-01 MED ORDER — SODIUM CHLORIDE 0.9 % IV SOLN
510.0000 mg | Freq: Once | INTRAVENOUS | Status: AC
  Administered 2017-10-01: 510 mg via INTRAVENOUS
  Filled 2017-10-01: qty 17

## 2017-10-01 MED ORDER — DIPHENHYDRAMINE HCL 50 MG/ML IJ SOLN
INTRAMUSCULAR | Status: AC
  Filled 2017-10-01: qty 1

## 2017-10-01 MED ORDER — DIPHENHYDRAMINE HCL 50 MG/ML IJ SOLN
12.5000 mg | Freq: Once | INTRAMUSCULAR | Status: AC
  Administered 2017-10-01: 12.5 mg via INTRAVENOUS

## 2017-10-01 MED ORDER — ACETAMINOPHEN 160 MG/5ML PO SOLN
650.0000 mg | Freq: Once | ORAL | Status: AC
  Administered 2017-10-01: 650 mg via ORAL
  Filled 2017-10-01: qty 20.3

## 2017-10-01 NOTE — Patient Instructions (Signed)

## 2017-10-19 ENCOUNTER — Other Ambulatory Visit: Payer: Self-pay | Admitting: Hematology and Oncology

## 2017-10-27 ENCOUNTER — Telehealth: Payer: Self-pay | Admitting: Internal Medicine

## 2017-10-27 MED ORDER — PANTOPRAZOLE SODIUM 40 MG PO TBEC: 40.0000 mg | DELAYED_RELEASE_TABLET | Freq: Every day | ORAL | 0 refills | Status: DC

## 2017-10-27 NOTE — Telephone Encounter (Signed)
Spoke at length with patient regarding omeprazole and her experiences with foul smell and taste of the medication. She states that this was the case with 2 different bottles of the same medication. Her thought process with the conversation went in several directions so a bit hard to follow but seems she wants a different medication. I have spoken to pharmacist, Ms. Nix at [facility] [address] who seems very familiar with patient. She states that pantoprazole is on patient's formulary for $1.25 monthly and is in tablet form so may be a bit easier for patient to handle. Dr Rhea BeltonPyrtle has given verbal orders that it is fine to switch patient from omeprazole to pantropazole. New rx for pantoprazole has been sent and omeprazole has been discontinued.

## 2017-12-09 ENCOUNTER — Inpatient Hospital Stay: Payer: Medicare Other

## 2017-12-09 ENCOUNTER — Encounter: Payer: Self-pay | Admitting: Hematology and Oncology

## 2017-12-09 ENCOUNTER — Inpatient Hospital Stay: Payer: Medicare Other | Attending: Hematology and Oncology

## 2017-12-09 ENCOUNTER — Ambulatory Visit: Payer: Medicare Other

## 2017-12-09 ENCOUNTER — Other Ambulatory Visit: Payer: Self-pay | Admitting: Hematology and Oncology

## 2017-12-09 ENCOUNTER — Inpatient Hospital Stay (HOSPITAL_BASED_OUTPATIENT_CLINIC_OR_DEPARTMENT_OTHER): Payer: Medicare Other | Admitting: Hematology and Oncology

## 2017-12-09 DIAGNOSIS — K5520 Angiodysplasia of colon without hemorrhage: Secondary | ICD-10-CM

## 2017-12-09 DIAGNOSIS — D509 Iron deficiency anemia, unspecified: Secondary | ICD-10-CM | POA: Diagnosis present

## 2017-12-09 LAB — PREPARE RBC (CROSSMATCH)

## 2017-12-09 LAB — SAMPLE TO BLOOD BANK

## 2017-12-09 LAB — CBC WITH DIFFERENTIAL/PLATELET
Basophils Absolute: 0 10*3/uL (ref 0.0–0.1)
Basophils Relative: 0 %
Eosinophils Absolute: 0.1 10*3/uL (ref 0.0–0.5)
Eosinophils Relative: 2 %
HCT: 21.6 % — ABNORMAL LOW (ref 34.8–46.6)
Hemoglobin: 6.2 g/dL — CL (ref 11.6–15.9)
LYMPHS ABS: 0.9 10*3/uL (ref 0.9–3.3)
LYMPHS PCT: 20 %
MCH: 21.6 pg — AB (ref 25.1–34.0)
MCHC: 28.7 g/dL — ABNORMAL LOW (ref 31.5–36.0)
MCV: 75.3 fL — AB (ref 79.5–101.0)
Monocytes Absolute: 0.4 10*3/uL (ref 0.1–0.9)
Monocytes Relative: 8 %
NEUTROS PCT: 70 %
Neutro Abs: 3.3 10*3/uL (ref 1.5–6.5)
Platelets: 364 10*3/uL (ref 145–400)
RBC: 2.87 MIL/uL — AB (ref 3.70–5.45)
RDW: 18.8 % — ABNORMAL HIGH (ref 11.2–14.5)
WBC: 4.7 10*3/uL (ref 3.9–10.3)

## 2017-12-09 LAB — FERRITIN: Ferritin: 4 ng/mL — ABNORMAL LOW (ref 9–269)

## 2017-12-09 LAB — ABO/RH: ABO/RH(D): O POS

## 2017-12-09 MED ORDER — ACETAMINOPHEN 325 MG PO TABS
650.0000 mg | ORAL_TABLET | Freq: Once | ORAL | Status: AC
  Administered 2017-12-09: 650 mg via ORAL

## 2017-12-09 MED ORDER — SODIUM CHLORIDE 0.9 % IV SOLN
250.0000 mL | Freq: Once | INTRAVENOUS | Status: AC
  Administered 2017-12-09: 250 mL via INTRAVENOUS

## 2017-12-09 MED ORDER — DIPHENHYDRAMINE HCL 25 MG PO CAPS
25.0000 mg | ORAL_CAPSULE | Freq: Once | ORAL | Status: AC
  Administered 2017-12-09: 25 mg via ORAL

## 2017-12-09 MED ORDER — DIPHENHYDRAMINE HCL 25 MG PO CAPS
ORAL_CAPSULE | ORAL | Status: AC
  Filled 2017-12-09: qty 1

## 2017-12-09 MED ORDER — ACETAMINOPHEN 325 MG PO TABS
ORAL_TABLET | ORAL | Status: AC
  Filled 2017-12-09: qty 1

## 2017-12-09 MED ORDER — ACETAMINOPHEN 325 MG PO TABS
ORAL_TABLET | ORAL | Status: AC
  Filled 2017-12-09: qty 2

## 2017-12-09 NOTE — Patient Instructions (Signed)

## 2017-12-09 NOTE — Assessment & Plan Note (Signed)
We discussed some of the risks, benefits, and alternatives of blood transfusions. The patient is symptomatic from anemia and the hemoglobin level is critically low.  Some of the side-effects to be expected including risks of transfusion reactions, chills, infection, syndrome of volume overload and risk of hospitalization from various reasons and the patient is willing to proceed and went ahead to sign consent today. I recommend she contact her GI physician for further follow-up I will give her 2 units of blood and bring her back in 2 weeks for further blood transfusion and iron infusion

## 2017-12-09 NOTE — Assessment & Plan Note (Signed)
She has history of iron deficiency anemia and angiodysplasia of the colon. She also has possible malabsorption syndrome. She had extensive investigations by gastroenterologist. She has not taken any proton pump inhibitor I recommend further evaluation by her GI physician to exclude active bleeding

## 2017-12-09 NOTE — Progress Notes (Signed)
[facility] OFFICE PROGRESS NOTE  [name], [name], MD SUMMARY OF HEMATOLOGIC HISTORY:  The patient has been complaining of fatigue and was found to have severe iron deficiency anemia. Approximately a year or 2 ago, she received 2 units of blood transfusion because of this. She had extensive GI evaluation with EGD and colonoscopy and was never found to have any sort of GI bleed. She has received 4 intravenous doses of iron infusion and the last infusion in 3 months ago. Bone marrow aspirate and biopsy in January 2013 showed no evidence of malignancy or persistent absence of iron stores. In 2014, she received 6 doses of intravenous iron infusion. She was referred to gastroenterology for further evaluation due to suspected GI bleed. Repeat GI endoscopy dated 12/26/2013 showed angiodysplastic lesion and moderate diverticulosis. The patient had received numerous intravenous iron infusion almost on a monthly basis from 03/15/2013 to 01/17/15. Her last intravenous iron was in July 2016, and then resumed in 2018 INTERVAL HISTORY: Melanie [name] 83 y.o. female returns for further follow-up. She complained of fatigue and palpitation The patient denies any recent signs or symptoms of bleeding such as spontaneous epistaxis, hematuria or hematochezia. She has no recent pica. She has not been taking proton pump inhibitor as prescribed  I have reviewed the past medical history, past surgical history, social history and family history with the patient and they are unchanged from previous note.  ALLERGIES:  is allergic to cephalexin; clindamycin/lincomycin; codeine; levofloxacin; morphine and related; other; quinolones; sulfa antibiotics; tramadol; amoxicillin; fexofenadine hcl; floxin [ocuflox]; neurontin [gabapentin]; and septra [bactrim].  MEDICATIONS:  Current Outpatient Medications  Medication Sig Dispense Refill  . aspirin 81 MG tablet Take 81 mg by mouth daily.     . Cyanocobalamin  (VITAMIN B12 PO) Take 500 mcg by mouth daily.     . folic acid (FOLVITE) 1 MG tablet TAKE 1 TABLET (1 MG TOTAL) BY MOUTH DAILY. 90 tablet 3  . magnesium oxide (MAG-OX) 400 MG tablet Take 400 mg by mouth daily.    . Menthol-Methyl Salicylate (MUSCLE RUB) 10-15 % CREA Apply 1 application topically as needed for muscle pain (apply to leg as needed).    . metoprolol (LOPRESSOR) 50 MG tablet Take 1 tablet (50 mg total) by mouth 2 (two) times daily. 180 tablet 1  . pantoprazole (PROTONIX) 40 MG tablet Take 1 tablet (40 mg total) by mouth daily. 90 tablet 0  . polyethylene glycol powder (GLYCOLAX/MIRALAX) powder Take 17 g by mouth daily. 1051 g 1  . UNABLE TO FIND Med Name:  [facility] Lidocaine Cream over the counter as needed for joint and muscle pain     No current facility-administered medications for this visit.      REVIEW OF SYSTEMS:   Constitutional: Denies fevers, chills or night sweats Eyes: Denies blurriness of vision Ears, nose, mouth, throat, and face: Denies mucositis or sore throat Respiratory: Denies cough, dyspnea or wheezes Cardiovascular: Denies palpitation, chest discomfort or lower extremity swelling Gastrointestinal:  Denies nausea, heartburn or change in bowel habits Skin: Denies abnormal skin rashes Lymphatics: Denies new lymphadenopathy or easy bruising Neurological:Denies numbness, tingling or new weaknesses Behavioral/Psych: Mood is stable, no new changes  All other systems were reviewed with the patient and are negative.  PHYSICAL EXAMINATION: ECOG PERFORMANCE STATUS: 1 - Symptomatic but completely ambulatory  Vitals:   12/09/17 1312  BP: (!) 158/64  Pulse: (!) 104  Resp: 18  Temp: 98.2 F (36.8 C)  SpO2: 100%  Filed Weights   12/09/17 1312  Weight: 185 lb 11.2 oz (84.2 kg)    GENERAL:alert, no distress and comfortable SKIN: skin color is pale, texture, turgor are normal, no rashes or significant lesions EYES: normal, Conjunctiva are pale and  non-injected, sclera clear OROPHARYNX:no exudate, no erythema and lips, buccal mucosa, and tongue normal  NECK: supple, thyroid normal size, non-tender, without nodularity LYMPH:  no palpable lymphadenopathy in the cervical, axillary or inguinal LUNGS: clear to auscultation and percussion with normal breathing effort HEART: Noted tachycardia with low murmur, no lower extremity edema ABDOMEN:abdomen soft, non-tender and normal bowel sounds Musculoskeletal:no cyanosis of digits and no clubbing  NEURO: alert & oriented x 3 with fluent speech, no focal motor/sensory deficits  LABORATORY DATA:  I have reviewed the data as listed     Component Value Date/Time   NA 140 11/02/2016 1403   NA 144 02/12/2014 0849   K 4.3 11/02/2016 1403   K 3.6 02/12/2014 0849   CL 106 11/02/2016 1403   CL 109 (H) 12/23/2012 1100   CO2 28 11/02/2016 1403   CO2 24 02/12/2014 0849   GLUCOSE 99 11/02/2016 1403   GLUCOSE 152 (H) 02/12/2014 0849   GLUCOSE 106 (H) 12/23/2012 1100   BUN 18 11/02/2016 1403   BUN 15.4 02/12/2014 0849   CREATININE 0.75 11/02/2016 1403   CREATININE 0.8 02/12/2014 0849   CALCIUM 10.6 (H) 11/02/2016 1403   CALCIUM 10.7 (H) 02/12/2014 0849   PROT 7.1 08/22/2015 1041   PROT 6.3 (L) 02/12/2014 0849   ALBUMIN 4.1 08/22/2015 1041   ALBUMIN 3.4 (L) 02/12/2014 0849   AST 17 08/22/2015 1041   AST 13 02/12/2014 0849   ALT 16 08/22/2015 1041   ALT 8 02/12/2014 0849   ALKPHOS 89 08/22/2015 1041   ALKPHOS 78 02/12/2014 0849   BILITOT 0.5 08/22/2015 1041   BILITOT 0.25 02/12/2014 0849   GFRNONAA 80 (L) 08/22/2011 1205   GFRAA >90 08/22/2011 1205    No results found for: SPEP, UPEP  Lab Results  Component Value Date   WBC 4.7 12/09/2017   NEUTROABS 3.3 12/09/2017   HGB 6.2 (LL) 12/09/2017   HCT 21.6 (L) 12/09/2017   MCV 75.3 (L) 12/09/2017   PLT 364 12/09/2017      Chemistry      Component Value Date/Time   NA 140 11/02/2016 1403   NA 144 02/12/2014 0849   K 4.3 11/02/2016  1403   K 3.6 02/12/2014 0849   CL 106 11/02/2016 1403   CL 109 (H) 12/23/2012 1100   CO2 28 11/02/2016 1403   CO2 24 02/12/2014 0849   BUN 18 11/02/2016 1403   BUN 15.4 02/12/2014 0849   CREATININE 0.75 11/02/2016 1403   CREATININE 0.8 02/12/2014 0849      Component Value Date/Time   CALCIUM 10.6 (H) 11/02/2016 1403   CALCIUM 10.7 (H) 02/12/2014 0849   ALKPHOS 89 08/22/2015 1041   ALKPHOS 78 02/12/2014 0849   AST 17 08/22/2015 1041   AST 13 02/12/2014 0849   ALT 16 08/22/2015 1041   ALT 8 02/12/2014 0849   BILITOT 0.5 08/22/2015 1041   BILITOT 0.25 02/12/2014 0849      ASSESSMENT & PLAN:  Iron deficiency anemia We discussed some of the risks, benefits, and alternatives of blood transfusions. The patient is symptomatic from anemia and the hemoglobin level is critically low.  Some of the side-effects to be expected including risks of transfusion reactions, chills, infection, syndrome of volume overload  and risk of hospitalization from various reasons and the patient is willing to proceed and went ahead to sign consent today. I recommend she contact her GI physician for further follow-up I will give her 2 units of blood and bring her back in 2 weeks for further blood transfusion and iron infusion  Angiodysplasia of colon She has history of iron deficiency anemia and angiodysplasia of the colon. She also has possible malabsorption syndrome. She had extensive investigations by gastroenterologist. She has not taken any proton pump inhibitor I recommend further evaluation by her GI physician to exclude active bleeding    Orders Placed This Encounter  Procedures  . Prepare RBC    Standing Status:   Standing    Number of Occurrences:   1    Order Specific Question:   # of Units    Answer:   2 units    Order Specific Question:   Transfusion Indications    Answer:   Symptomatic Anemia    Order Specific Question:   If emergent release call blood bank    Answer:   Not emergent  release  . Type and screen    Standing Status:   Future    Standing Expiration Date:   12/09/2018  . Type and screen  . Sample to Blood Bank    Standing Status:   Standing    Number of Occurrences:   33    Standing Expiration Date:   12/09/2018  . ABO/Rh    All questions were answered. The patient knows to call the clinic with any problems, questions or concerns. No barriers to learning was detected.  I spent 15 minutes counseling the patient face to face. The total time spent in the appointment was 20 minutes and more than 50% was on counseling.     [name], MD 2/28/20191:36 PM

## 2017-12-11 LAB — TYPE AND SCREEN
ABO/RH(D): O POS
ANTIBODY SCREEN: NEGATIVE
Unit division: 0
Unit division: 0

## 2017-12-11 LAB — BPAM RBC
BLOOD PRODUCT EXPIRATION DATE: 201903312359
BLOOD PRODUCT EXPIRATION DATE: 201903312359
ISSUE DATE / TIME: 201902281331
ISSUE DATE / TIME: 201902281331
UNIT TYPE AND RH: 5100
UNIT TYPE AND RH: 5100

## 2017-12-15 ENCOUNTER — Telehealth: Payer: Self-pay | Admitting: Internal Medicine

## 2017-12-15 NOTE — Telephone Encounter (Signed)
Pt states she has had to get blood transfusions recently and her Ferritin is way down. She is seeing hematology for IDA and is scheduled for more Iron infusions. Per OV note with Hematology they wanted pt to follow-up with GI to see if there is any GI cause for low Hgb and low Ferritin. Pt scheduled to see Amy Esterwood PA 12/30/17@10 :30am. Pt aware of appt.

## 2017-12-20 ENCOUNTER — Inpatient Hospital Stay: Payer: Medicare Other

## 2017-12-20 ENCOUNTER — Inpatient Hospital Stay: Payer: Medicare Other | Attending: Hematology and Oncology

## 2017-12-20 VITALS — BP 121/61 | HR 81 | Temp 98.0°F | Resp 16

## 2017-12-20 DIAGNOSIS — D509 Iron deficiency anemia, unspecified: Secondary | ICD-10-CM | POA: Insufficient documentation

## 2017-12-20 DIAGNOSIS — K5520 Angiodysplasia of colon without hemorrhage: Secondary | ICD-10-CM

## 2017-12-20 LAB — CBC WITH DIFFERENTIAL/PLATELET
Basophils Absolute: 0 10*3/uL (ref 0.0–0.1)
Basophils Relative: 1 %
EOS ABS: 0.1 10*3/uL (ref 0.0–0.5)
Eosinophils Relative: 3 %
HCT: 25.1 % — ABNORMAL LOW (ref 34.8–46.6)
HEMOGLOBIN: 7.6 g/dL — AB (ref 11.6–15.9)
Lymphocytes Relative: 26 %
Lymphs Abs: 0.8 10*3/uL — ABNORMAL LOW (ref 0.9–3.3)
MCH: 22.5 pg — AB (ref 25.1–34.0)
MCHC: 30.2 g/dL — AB (ref 31.5–36.0)
MCV: 74.6 fL — ABNORMAL LOW (ref 79.5–101.0)
MONOS PCT: 10 %
Monocytes Absolute: 0.3 10*3/uL (ref 0.1–0.9)
NEUTROS PCT: 60 %
Neutro Abs: 1.9 10*3/uL (ref 1.5–6.5)
Platelets: 392 10*3/uL (ref 145–400)
RBC: 3.37 MIL/uL — AB (ref 3.70–5.45)
RDW: 24.5 % — ABNORMAL HIGH (ref 11.2–14.5)
WBC: 3.2 10*3/uL — AB (ref 3.9–10.3)

## 2017-12-20 LAB — SAMPLE TO BLOOD BANK

## 2017-12-20 LAB — FERRITIN: Ferritin: 5 ng/mL — ABNORMAL LOW (ref 9–269)

## 2017-12-20 LAB — PREPARE RBC (CROSSMATCH)

## 2017-12-20 MED ORDER — DIPHENHYDRAMINE HCL 12.5 MG/5ML PO ELIX
25.0000 mg | ORAL_SOLUTION | Freq: Once | ORAL | Status: AC
  Administered 2017-12-20: 25 mg via ORAL

## 2017-12-20 MED ORDER — SODIUM CHLORIDE 0.9 % IV SOLN
250.0000 mL | Freq: Once | INTRAVENOUS | Status: AC
  Administered 2017-12-20: 250 mL via INTRAVENOUS

## 2017-12-20 MED ORDER — FERUMOXYTOL INJECTION 510 MG/17 ML
510.0000 mg | Freq: Once | INTRAVENOUS | Status: AC
  Administered 2017-12-20: 510 mg via INTRAVENOUS
  Filled 2017-12-20: qty 17

## 2017-12-20 MED ORDER — SODIUM CHLORIDE 0.9 % IV SOLN
Freq: Once | INTRAVENOUS | Status: AC
  Administered 2017-12-20: 10:00:00 via INTRAVENOUS

## 2017-12-20 MED ORDER — ACETAMINOPHEN 160 MG/5ML PO SOLN
650.0000 mg | Freq: Once | ORAL | Status: AC
  Administered 2017-12-20: 650 mg via ORAL

## 2017-12-20 MED ORDER — ACETAMINOPHEN 325 MG PO TABS: 650.0000 mg | ORAL_TABLET | Freq: Once | ORAL | Status: DC

## 2017-12-20 MED ORDER — DIPHENHYDRAMINE HCL 25 MG PO CAPS: 25.0000 mg | ORAL_CAPSULE | Freq: Once | ORAL | Status: DC

## 2017-12-20 MED ORDER — DIPHENHYDRAMINE HCL 12.5 MG/5ML PO ELIX
ORAL_SOLUTION | ORAL | Status: AC
  Filled 2017-12-20: qty 10

## 2017-12-20 MED ORDER — ACETAMINOPHEN 160 MG/5ML PO SOLN
ORAL | Status: AC
  Filled 2017-12-20: qty 20.3

## 2017-12-20 NOTE — Patient Instructions (Addendum)
Blood Transfusion, Adult A blood transfusion is a procedure in which you receive donated blood, including plasma, platelets, and red blood cells, through an IV tube. You may need a blood transfusion because of illness, surgery, or injury. The blood may come from a donor. You may also be able to donate blood for yourself (autologous blood donation) before a surgery if you know that you might require a blood transfusion. The blood given in a transfusion is made up of different types of cells. You may receive:  Red blood cells. These carry oxygen to the cells in the body.  White blood cells. These help you fight infections.  Platelets. These help your blood to clot.  Plasma. This is the liquid part of your blood and it helps with fluid imbalances.  If you have hemophilia or another clotting disorder, you may also receive other types of blood products. Tell a health care provider about:  Any allergies you have.  All medicines you are taking, including vitamins, herbs, eye drops, creams, and over-the-counter medicines.  Any problems you or family members have had with anesthetic medicines.  Any blood disorders you have.  Any surgeries you have had.  Any medical conditions you have, including any recent fever or cold symptoms.  Whether you are pregnant or may be pregnant.  Any previous reactions you have had during a blood transfusion. What are the risks? Generally, this is a safe procedure. However, problems may occur, including:  Having an allergic reaction to something in the donated blood. Hives and itching may be symptoms of this type of reaction.  Fever. This may be a reaction to the white blood cells in the transfused blood. Nausea or chest pain may accompany a fever.  Iron overload. This can happen from having many transfusions.  Transfusion-related acute lung injury (TRALI). This is a rare reaction that causes lung damage. The cause is not known.TRALI can occur within hours  of a transfusion or several days later.  Sudden (acute) or delayed hemolytic reactions. This happens if your blood does not match the cells in your transfusion. Your body's defense system (immune system) may try to attack the new cells. This complication is rare. The symptoms include fever, chills, nausea, and low back pain or chest pain.  Infection or disease transmission. This is rare.  What happens before the procedure?  You will have a blood test to determine your blood type. This is necessary to know what kind of blood your body will accept and to match it to the donor blood.  If you are going to have a planned surgery, you may be able to do an autologous blood donation. This may be done in case you need to have a transfusion.  If you have had an allergic reaction to a transfusion in the past, you may be given medicine to help prevent a reaction. This medicine may be given to you by mouth or through an IV tube.  You will have your temperature, blood pressure, and pulse monitored before the transfusion.  Follow instructions from your health care provider about eating and drinking restrictions.  Ask your health care provider about: ? Changing or stopping your regular medicines. This is especially important if you are taking diabetes medicines or blood thinners. ? Taking medicines such as aspirin and ibuprofen. These medicines can thin your blood. Do not take these medicines before your procedure if your health care provider instructs you not to. What happens during the procedure?  An IV tube will   be inserted into one of your veins.  The bag of donated blood will be attached to your IV tube. The blood will then enter through your vein.  Your temperature, blood pressure, and pulse will be monitored regularly during the transfusion. This monitoring is done to detect early signs of a transfusion reaction.  If you have any signs or symptoms of a reaction, your transfusion will be stopped  and you may be given medicine.  When the transfusion is complete, your IV tube will be removed.  Pressure may be applied to the IV site for a few minutes.  A bandage (dressing) will be applied. The procedure may vary among health care providers and hospitals. What happens after the procedure?  Your temperature, blood pressure, heart rate, breathing rate, and blood oxygen level will be monitored often.  Your blood may be tested to see how you are responding to the transfusion.  You may be warmed with fluids or blankets to maintain a normal body temperature. Summary  A blood transfusion is a procedure in which you receive donated blood, including plasma, platelets, and red blood cells, through an IV tube.  Your temperature, blood pressure, and pulse will be monitored before, during, and after the transfusion.  Your blood may be tested after the transfusion to see how your body has responded. This information is not intended to replace advice given to you by your health care provider. Make sure you discuss any questions you have with your health care provider. Document Released: 09/25/2000 Document Revised: 06/25/2016 Document Reviewed: 06/25/2016 Elsevier Interactive Patient Education  2018 [employer].  Ferumoxytol injection What is this medicine? FERUMOXYTOL is an iron complex. Iron is used to make healthy red blood cells, which carry oxygen and nutrients throughout the body. This medicine is used to treat iron deficiency anemia in people with chronic kidney disease. This medicine may be used for other purposes; ask your health care provider or pharmacist if you have questions. COMMON BRAND NAME(S): Feraheme What should I tell my health care provider before I take this medicine? They need to know if you have any of these conditions: -anemia not caused by low iron levels -high levels of iron in the blood -magnetic resonance imaging (MRI) test scheduled -an unusual or allergic  reaction to iron, other medicines, foods, dyes, or preservatives -pregnant or trying to get pregnant -breast-feeding How should I use this medicine? This medicine is for injection into a vein. It is given by a health care professional in a hospital or clinic setting. Talk to your pediatrician regarding the use of this medicine in children. Special care may be needed. Overdosage: If you think you have taken too much of this medicine contact a poison control center or emergency room at once. NOTE: This medicine is only for you. Do not share this medicine with others. What if I miss a dose? It is important not to miss your dose. Call your doctor or health care professional if you are unable to keep an appointment. What may interact with this medicine? This medicine may interact with the following medications: -other iron products This list may not describe all possible interactions. Give your health care provider a list of all the medicines, herbs, non-prescription drugs, or dietary supplements you use. Also tell them if you smoke, drink alcohol, or use illegal drugs. Some items may interact with your medicine. What should I watch for while using this medicine? Visit your doctor or healthcare professional regularly. Tell your doctor or healthcare   professional if your symptoms do not start to get better or if they get worse. You may need blood work done while you are taking this medicine. You may need to follow a special diet. Talk to your doctor. Foods that contain iron include: whole grains/cereals, dried fruits, beans, or peas, leafy green vegetables, and organ meats (liver, kidney). What side effects may I notice from receiving this medicine? Side effects that you should report to your doctor or health care professional as soon as possible: -allergic reactions like skin rash, itching or hives, swelling of the face, lips, or tongue -breathing problems -changes in blood pressure -feeling faint or  lightheaded, falls -fever or chills -flushing, sweating, or hot feelings -swelling of the ankles or feet Side effects that usually do not require medical attention (report to your doctor or health care professional if they continue or are bothersome): -diarrhea -headache -nausea, vomiting -stomach pain This list may not describe all possible side effects. Call your doctor for medical advice about side effects. You may report side effects to FDA at 1-800-FDA-1088. Where should I keep my medicine? This drug is given in a hospital or clinic and will not be stored at home. NOTE: This sheet is a summary. It may not cover all possible information. If you have questions about this medicine, talk to your doctor, pharmacist, or health care provider.  2018 Elsevier/Gold Standard (2015-10-31 12:41:49)  

## 2017-12-21 LAB — BPAM RBC
BLOOD PRODUCT EXPIRATION DATE: 201904032359
Blood Product Expiration Date: 201904032359
ISSUE DATE / TIME: 201903111046
ISSUE DATE / TIME: 201903111046
UNIT TYPE AND RH: 5100
Unit Type and Rh: 5100

## 2017-12-21 LAB — TYPE AND SCREEN
ABO/RH(D): O POS
ANTIBODY SCREEN: NEGATIVE
Unit division: 0
Unit division: 0

## 2017-12-23 ENCOUNTER — Telehealth: Payer: Self-pay

## 2017-12-23 NOTE — Telephone Encounter (Signed)
She called and left message to call her.  Called back. She wanted to make sure we have the corrected phone number and to make sure she has her appt times for 3/18. Clarified that we have the correct phone number. Given appt times and dates. She is concerned about her lab work. She is requesting a lab appt in between her 3/18 and 4/18 lab appt. She feels it is to long to wait.

## 2017-12-27 ENCOUNTER — Inpatient Hospital Stay: Payer: Medicare Other

## 2017-12-27 VITALS — BP 121/55 | HR 69 | Temp 97.9°F | Resp 18

## 2017-12-27 DIAGNOSIS — D509 Iron deficiency anemia, unspecified: Secondary | ICD-10-CM

## 2017-12-27 DIAGNOSIS — K5520 Angiodysplasia of colon without hemorrhage: Secondary | ICD-10-CM

## 2017-12-27 LAB — CBC WITH DIFFERENTIAL/PLATELET
BASOS ABS: 0 10*3/uL (ref 0.0–0.1)
Basophils Relative: 0 %
Eosinophils Absolute: 0.2 10*3/uL (ref 0.0–0.5)
Eosinophils Relative: 4 %
HEMATOCRIT: 37.1 % (ref 34.8–46.6)
HEMOGLOBIN: 11.3 g/dL — AB (ref 11.6–15.9)
LYMPHS PCT: 19 %
Lymphs Abs: 0.8 10*3/uL — ABNORMAL LOW (ref 0.9–3.3)
MCH: 24.8 pg — ABNORMAL LOW (ref 25.1–34.0)
MCHC: 30.5 g/dL — ABNORMAL LOW (ref 31.5–36.0)
MCV: 81.5 fL (ref 79.5–101.0)
MONO ABS: 0.4 10*3/uL (ref 0.1–0.9)
Monocytes Relative: 8 %
NEUTROS ABS: 3.1 10*3/uL (ref 1.5–6.5)
NEUTROS PCT: 69 %
Platelets: 353 10*3/uL (ref 145–400)
RBC: 4.55 MIL/uL (ref 3.70–5.45)
RDW: 24.3 % — AB (ref 11.2–14.5)
WBC: 4.5 10*3/uL (ref 3.9–10.3)

## 2017-12-27 LAB — FERRITIN: Ferritin: 211 ng/mL (ref 9–269)

## 2017-12-27 LAB — SAMPLE TO BLOOD BANK

## 2017-12-27 MED ORDER — ACETAMINOPHEN 160 MG/5ML PO SOLN
ORAL | Status: AC
Start: 2017-12-27 — End: 2017-12-27
  Filled 2017-12-27: qty 20.3

## 2017-12-27 MED ORDER — DIPHENHYDRAMINE HCL 25 MG PO TABS
25.0000 mg | ORAL_TABLET | Freq: Once | ORAL | Status: DC
  Filled 2017-12-27: qty 1

## 2017-12-27 MED ORDER — DIPHENHYDRAMINE HCL 25 MG PO CAPS
ORAL_CAPSULE | ORAL | Status: AC
  Filled 2017-12-27: qty 1

## 2017-12-27 MED ORDER — ACETAMINOPHEN 160 MG/5ML PO SOLN
650.0000 mg | Freq: Once | ORAL | Status: AC
  Administered 2017-12-27: 650 mg via ORAL

## 2017-12-27 MED ORDER — DIPHENHYDRAMINE HCL 12.5 MG/5ML PO ELIX
ORAL_SOLUTION | ORAL | Status: AC
  Filled 2017-12-27: qty 10

## 2017-12-27 MED ORDER — SODIUM CHLORIDE 0.9 % IV SOLN
510.0000 mg | Freq: Once | INTRAVENOUS | Status: AC
  Administered 2017-12-27: 510 mg via INTRAVENOUS
  Filled 2017-12-27: qty 17

## 2017-12-27 MED ORDER — DIPHENHYDRAMINE HCL 12.5 MG/5ML PO ELIX
25.0000 mg | ORAL_SOLUTION | Freq: Once | ORAL | Status: AC
  Administered 2017-12-27: 25 mg via ORAL

## 2017-12-27 MED ORDER — SODIUM CHLORIDE 0.9 % IV SOLN
Freq: Once | INTRAVENOUS | Status: AC
  Administered 2017-12-27: 14:00:00 via INTRAVENOUS

## 2017-12-27 MED ORDER — ACETAMINOPHEN 325 MG PO TABS: 650.0000 mg | ORAL_TABLET | ORAL | Status: DC | PRN

## 2017-12-27 NOTE — Patient Instructions (Signed)

## 2017-12-30 ENCOUNTER — Ambulatory Visit: Payer: Medicare Other | Admitting: Physician Assistant

## 2018-01-05 ENCOUNTER — Encounter: Payer: Self-pay | Admitting: Internal Medicine

## 2018-01-11 ENCOUNTER — Other Ambulatory Visit: Payer: Self-pay | Admitting: Internal Medicine

## 2018-01-23 ENCOUNTER — Other Ambulatory Visit: Payer: Self-pay | Admitting: Internal Medicine

## 2018-01-27 ENCOUNTER — Telehealth: Payer: Self-pay

## 2018-01-27 ENCOUNTER — Inpatient Hospital Stay (HOSPITAL_BASED_OUTPATIENT_CLINIC_OR_DEPARTMENT_OTHER): Payer: Medicare Other | Admitting: Hematology and Oncology

## 2018-01-27 ENCOUNTER — Telehealth: Payer: Self-pay | Admitting: Hematology and Oncology

## 2018-01-27 ENCOUNTER — Other Ambulatory Visit: Payer: Self-pay

## 2018-01-27 ENCOUNTER — Encounter: Payer: Self-pay | Admitting: Hematology and Oncology

## 2018-01-27 ENCOUNTER — Inpatient Hospital Stay: Payer: Medicare Other

## 2018-01-27 ENCOUNTER — Inpatient Hospital Stay: Payer: Medicare Other | Attending: Hematology and Oncology

## 2018-01-27 DIAGNOSIS — K5730 Diverticulosis of large intestine without perforation or abscess without bleeding: Secondary | ICD-10-CM

## 2018-01-27 DIAGNOSIS — D509 Iron deficiency anemia, unspecified: Secondary | ICD-10-CM

## 2018-01-27 DIAGNOSIS — K5520 Angiodysplasia of colon without hemorrhage: Secondary | ICD-10-CM

## 2018-01-27 LAB — CBC WITH DIFFERENTIAL/PLATELET
BASOS ABS: 0 10*3/uL (ref 0.0–0.1)
Basophils Relative: 1 %
EOS PCT: 7 %
Eosinophils Absolute: 0.3 10*3/uL (ref 0.0–0.5)
HCT: 40.3 % (ref 34.8–46.6)
Hemoglobin: 12.8 g/dL (ref 11.6–15.9)
LYMPHS PCT: 26 %
Lymphs Abs: 1.4 10*3/uL (ref 0.9–3.3)
MCH: 26.6 pg (ref 25.1–34.0)
MCHC: 31.8 g/dL (ref 31.5–36.0)
MCV: 83.8 fL (ref 79.5–101.0)
Monocytes Absolute: 0.5 10*3/uL (ref 0.1–0.9)
Monocytes Relative: 9 %
Neutro Abs: 3 10*3/uL (ref 1.5–6.5)
Neutrophils Relative %: 57 %
PLATELETS: 227 10*3/uL (ref 145–400)
RBC: 4.81 MIL/uL (ref 3.70–5.45)
RDW: 23.1 % — ABNORMAL HIGH (ref 11.2–14.5)
WBC: 5.2 10*3/uL (ref 3.9–10.3)

## 2018-01-27 LAB — SAMPLE TO BLOOD BANK

## 2018-01-27 LAB — FERRITIN: FERRITIN: 108 ng/mL (ref 9–269)

## 2018-01-27 NOTE — Telephone Encounter (Signed)
Patient in lobby. Gave copy of labs and below message. Verbalized understanding.

## 2018-01-27 NOTE — Assessment & Plan Note (Signed)
She has resolution of iron deficiency anemia since recent blood transfusion and intravenous iron replacement therapy She denies obvious bleeding recently She has appointment to see GI service for further follow-up

## 2018-01-27 NOTE — Progress Notes (Signed)
[facility] OFFICE PROGRESS NOTE  Melanie [name], [name], MD  ASSESSMENT & PLAN:  Iron deficiency anemia She has resolution of iron deficiency anemia since recent blood transfusion and intravenous iron replacement therapy She denies obvious bleeding recently She has appointment to see GI service for further follow-up   No orders of the defined types were placed in this encounter.   INTERVAL HISTORY: Melanie [name] 83 y.o. female returns for further follow-up She tolerated recent intravenous iron and blood transfusion well She denies infusion reaction The patient denies any recent signs or symptoms of bleeding such as spontaneous epistaxis, hematuria or hematochezia. SUMMARY OF HEMATOLOGIC HISTORY:  The patient has been complaining of fatigue and was found to have severe iron deficiency anemia. Approximately a year or 2 ago, she received 2 units of blood transfusion because of this. She had extensive GI evaluation with EGD and colonoscopy and was never found to have any sort of GI bleed. She has received 4 intravenous doses of iron infusion and the last infusion in 3 months ago. Bone marrow aspirate and biopsy in January 2013 showed no evidence of malignancy or persistent absence of iron stores. In 2014, she received 6 doses of intravenous iron infusion. She was referred to gastroenterology for further evaluation due to suspected GI bleed. Repeat GI endoscopy dated 12/26/2013 showed angiodysplastic lesion and moderate diverticulosis. The patient had received numerous intravenous iron infusion almost on a monthly basis from 03/15/2013 to 01/17/15. Her last intravenous iron was in July 2016, and then resumed in 2018  I have reviewed the past medical history, past surgical history, social history and family history with the patient and they are unchanged from previous note.  ALLERGIES:  is allergic to cephalexin; clindamycin/lincomycin; codeine; levofloxacin; morphine and related;  other; quinolones; sulfa antibiotics; tramadol; amoxicillin; fexofenadine hcl; floxin [ocuflox]; neurontin [gabapentin]; and septra [bactrim].  MEDICATIONS:  Current Outpatient Medications  Medication Sig Dispense Refill  . aspirin 81 MG tablet Take 81 mg by mouth daily.     . Cyanocobalamin (VITAMIN B12 PO) Take 500 mcg by mouth daily.     . folic acid (FOLVITE) 1 MG tablet TAKE 1 TABLET (1 MG TOTAL) BY MOUTH DAILY. 90 tablet 3  . magnesium oxide (MAG-OX) 400 MG tablet Take 400 mg by mouth daily.    . Menthol-Methyl Salicylate (MUSCLE RUB) 10-15 % CREA Apply 1 application topically as needed for muscle pain (apply to leg as needed).    . metoprolol (LOPRESSOR) 50 MG tablet Take 1 tablet (50 mg total) by mouth 2 (two) times daily. 180 tablet 1  . pantoprazole (PROTONIX) 40 MG tablet TAKE 1 TABLET BY MOUTH EVERY DAY 90 tablet 0  . polyethylene glycol powder (GLYCOLAX/MIRALAX) powder Take 17 [name] by mouth daily. 1051 [name] 1  . UNABLE TO FIND Med Name:  [facility] Lidocaine Cream over the counter as needed for joint and muscle pain     No current facility-administered medications for this visit.      REVIEW OF SYSTEMS:   Constitutional: Denies fevers, chills or night sweats Eyes: Denies blurriness of vision Ears, nose, mouth, throat, and face: Denies mucositis or sore throat Respiratory: Denies cough, dyspnea or wheezes Cardiovascular: Denies palpitation, chest discomfort or lower extremity swelling Gastrointestinal:  Denies nausea, heartburn or change in bowel habits Skin: Denies abnormal skin rashes Lymphatics: Denies new lymphadenopathy or easy bruising Neurological:Denies numbness, tingling or new weaknesses Behavioral/Psych: Mood is stable, no new changes  All other systems were reviewed with  the patient and are negative.  PHYSICAL EXAMINATION: ECOG PERFORMANCE STATUS: 0 - Asymptomatic  Vitals:   01/27/18 1215  BP: 135/77  Pulse: 66  Resp: 18  Temp: 98.1 F (36.7 C)  SpO2: 99%    Filed Weights   01/27/18 1215  Weight: 184 lb 12.8 oz (83.8 kg)    GENERAL:alert, no distress and comfortable NEURO: alert & oriented x 3 with fluent speech, no focal motor/sensory deficits  LABORATORY DATA:  I have reviewed the data as listed     Component Value Date/Time   NA 140 11/02/2016 1403   NA 144 02/12/2014 0849   K 4.3 11/02/2016 1403   K 3.6 02/12/2014 0849   CL 106 11/02/2016 1403   CL 109 (H) 12/23/2012 1100   CO2 28 11/02/2016 1403   CO2 24 02/12/2014 0849   GLUCOSE 99 11/02/2016 1403   GLUCOSE 152 (H) 02/12/2014 0849   GLUCOSE 106 (H) 12/23/2012 1100   BUN 18 11/02/2016 1403   BUN 15.4 02/12/2014 0849   CREATININE 0.75 11/02/2016 1403   CREATININE 0.8 02/12/2014 0849   CALCIUM 10.6 (H) 11/02/2016 1403   CALCIUM 10.7 (H) 02/12/2014 0849   PROT 7.1 08/22/2015 1041   PROT 6.3 (L) 02/12/2014 0849   ALBUMIN 4.1 08/22/2015 1041   ALBUMIN 3.4 (L) 02/12/2014 0849   AST 17 08/22/2015 1041   AST 13 02/12/2014 0849   ALT 16 08/22/2015 1041   ALT 8 02/12/2014 0849   ALKPHOS 89 08/22/2015 1041   ALKPHOS 78 02/12/2014 0849   BILITOT 0.5 08/22/2015 1041   BILITOT 0.25 02/12/2014 0849   GFRNONAA 80 (L) 08/22/2011 1205   GFRAA >90 08/22/2011 1205    No results found for: SPEP, UPEP  Lab Results  Component Value Date   WBC 5.2 01/27/2018   NEUTROABS 3.0 01/27/2018   HGB 12.8 01/27/2018   HCT 40.3 01/27/2018   MCV 83.8 01/27/2018   PLT 227 01/27/2018      Chemistry      Component Value Date/Time   NA 140 11/02/2016 1403   NA 144 02/12/2014 0849   K 4.3 11/02/2016 1403   K 3.6 02/12/2014 0849   CL 106 11/02/2016 1403   CL 109 (H) 12/23/2012 1100   CO2 28 11/02/2016 1403   CO2 24 02/12/2014 0849   BUN 18 11/02/2016 1403   BUN 15.4 02/12/2014 0849   CREATININE 0.75 11/02/2016 1403   CREATININE 0.8 02/12/2014 0849      Component Value Date/Time   CALCIUM 10.6 (H) 11/02/2016 1403   CALCIUM 10.7 (H) 02/12/2014 0849   ALKPHOS 89 08/22/2015 1041    ALKPHOS 78 02/12/2014 0849   AST 17 08/22/2015 1041   AST 13 02/12/2014 0849   ALT 16 08/22/2015 1041   ALT 8 02/12/2014 0849   BILITOT 0.5 08/22/2015 1041   BILITOT 0.25 02/12/2014 0849       I spent 10 minutes counseling the patient face to face. The total time spent in the appointment was 15 minutes and more than 50% was on counseling.   All questions were answered. The patient knows to call the clinic with any problems, questions or concerns. No barriers to learning was detected.    Artis DelayNi Tesean Stump, MD 4/18/201912:29 PM

## 2018-01-27 NOTE — Telephone Encounter (Signed)
Gave avs and calendar ° °

## 2018-01-27 NOTE — Telephone Encounter (Signed)
-----   Message from Artis DelayNi Gorsuch, MD sent at 01/27/2018  1:32 PM EDT ----- Regarding: iron studies Let her know ferritin is 100 Very good Continue labs every 6 weeks as discussed ----- Message ----- From: [place], [place] Sent: 01/27/2018  11:51 AM To: Artis DelayNi Gorsuch, MD

## 2018-02-23 ENCOUNTER — Other Ambulatory Visit: Payer: Self-pay | Admitting: Family Medicine

## 2018-02-23 ENCOUNTER — Other Ambulatory Visit: Payer: Self-pay | Admitting: Internal Medicine

## 2018-02-23 DIAGNOSIS — Z1231 Encounter for screening mammogram for malignant neoplasm of breast: Secondary | ICD-10-CM

## 2018-03-10 ENCOUNTER — Inpatient Hospital Stay: Payer: Medicare Other | Attending: Hematology and Oncology

## 2018-03-10 ENCOUNTER — Telehealth: Payer: Self-pay

## 2018-03-10 DIAGNOSIS — D509 Iron deficiency anemia, unspecified: Secondary | ICD-10-CM | POA: Diagnosis present

## 2018-03-10 DIAGNOSIS — K5520 Angiodysplasia of colon without hemorrhage: Secondary | ICD-10-CM

## 2018-03-10 LAB — CBC WITH DIFFERENTIAL/PLATELET
Basophils Absolute: 0 10*3/uL (ref 0.0–0.1)
Basophils Relative: 1 %
EOS ABS: 0.2 10*3/uL (ref 0.0–0.5)
Eosinophils Relative: 5 %
HCT: 42.6 % (ref 34.8–46.6)
Hemoglobin: 13.9 g/dL (ref 11.6–15.9)
Lymphocytes Relative: 26 %
Lymphs Abs: 1.1 10*3/uL (ref 0.9–3.3)
MCH: 28 pg (ref 25.1–34.0)
MCHC: 32.6 g/dL (ref 31.5–36.0)
MCV: 85.7 fL (ref 79.5–101.0)
Monocytes Absolute: 0.4 10*3/uL (ref 0.1–0.9)
Monocytes Relative: 8 %
NEUTROS ABS: 2.6 10*3/uL (ref 1.5–6.5)
NEUTROS PCT: 60 %
PLATELETS: 232 10*3/uL (ref 145–400)
RBC: 4.97 MIL/uL (ref 3.70–5.45)
RDW: 17.7 % — ABNORMAL HIGH (ref 11.2–14.5)
WBC: 4.4 10*3/uL (ref 3.9–10.3)

## 2018-03-10 LAB — SAMPLE TO BLOOD BANK

## 2018-03-10 LAB — FERRITIN: Ferritin: 36 ng/mL (ref 9–269)

## 2018-03-10 NOTE — Telephone Encounter (Signed)
Called and given below message. She verbalized understanding. 

## 2018-03-10 NOTE — Telephone Encounter (Signed)
-----   Message from [name], MD sent at 03/10/2018 11:54 AM EDT ----- Regarding: labs ok Let her know she is not anemic Iron study is OK Will continue labs monitoring as scheduled ----- Message ----- From: Leory Plowman, Lab In [place] Sent: 03/10/2018  10:50 AM To: [name], MD

## 2018-03-29 ENCOUNTER — Ambulatory Visit (INDEPENDENT_AMBULATORY_CARE_PROVIDER_SITE_OTHER): Payer: Medicare Other | Admitting: Internal Medicine

## 2018-03-29 ENCOUNTER — Encounter: Payer: Self-pay | Admitting: Internal Medicine

## 2018-03-29 VITALS — BP 120/74 | HR 80 | Ht 62.0 in | Wt 183.4 lb

## 2018-03-29 DIAGNOSIS — K219 Gastro-esophageal reflux disease without esophagitis: Secondary | ICD-10-CM

## 2018-03-29 DIAGNOSIS — D500 Iron deficiency anemia secondary to blood loss (chronic): Secondary | ICD-10-CM | POA: Diagnosis not present

## 2018-03-29 DIAGNOSIS — K5909 Other constipation: Secondary | ICD-10-CM | POA: Diagnosis not present

## 2018-03-29 DIAGNOSIS — Z8711 Personal history of peptic ulcer disease: Secondary | ICD-10-CM

## 2018-03-29 MED ORDER — PANTOPRAZOLE SODIUM 40 MG PO TBEC: 40.0000 mg | DELAYED_RELEASE_TABLET | Freq: Every day | ORAL | 1 refills | Status: DC

## 2018-03-29 MED ORDER — POLYETHYLENE GLYCOL 3350 17 GM/SCOOP PO POWD: 17.0000 g | Freq: Every day | ORAL | 1 refills | Status: DC

## 2018-03-29 NOTE — Progress Notes (Signed)
Subjective:    Patient ID: Melanie [name], female    DOB: 10/29/1934, 83 y.o.   MRN: [id number]  HPI Melanie [name] is an 83 year old female with hx of IDA, colonic angiodysplastic lesions (prior APC), history of H. pylori negative duodenal ulcer, hiatal hernia and GERD who is here for follow-up.  She was last seen on 07/21/2017 and presents alone today.  She reports overall she is doing well today.  She has been following with Dr. Bertis Ruddy with hematology for periodic IV iron infusion as well as blood count monitoring.  She had labs performed last month which showed a normal hemoglobin and normal ferritin.  She has labs scheduled for July 11, August 22, October 4 along with follow-up with hematology on October 4.  She has had no overt bleeding.  No abdominal pain.  Her right upper quadrant abdominal pain has resolved.  Constipation is under good control with MiraLAX which she takes daily.  She has continue pantoprazole 40 mg daily and has no heartburn, dysphagia or odynophagia.  She changed pantoprazole after finding a foul smell with her generic omeprazole.  Omeprazole was tested and found to be okay per [employer].  Review of Systems As per HPI, otherwise negative  Current Medications, Allergies, Past Medical History, Past Surgical History, Family History and Social History were reviewed in [employer] record.     Objective:   Physical Exam BP 120/74 (BP Location: Left Arm, Patient Position: Sitting, Cuff Size: Normal)   Pulse 80   Ht 5\' 2"  (1.575 m)   Wt 183 lb 6 oz (83.2 kg)   BMI 33.54 kg/m  Constitutional: Well-developed and well-nourished. No distress. HEENT: Normocephalic and atraumatic. Conjunctivae are normal.  No scleral icterus. Neck: Neck supple. Trachea midline. Cardiovascular: Normal rate, regular rhythm and intact distal pulses.  Abdominal: Soft, nontender, nondistended. Bowel sounds active throughout. Extremities: no clubbing, cyanosis, or  edema Neurological: Alert and oriented to person place and time. Skin: Skin is warm and dry. Psychiatric: Normal mood and affect. Behavior is normal.  CBC    Component Value Date/Time   WBC 4.4 03/10/2018 1018   RBC 4.97 03/10/2018 1018   HGB 13.9 03/10/2018 1018   HGB 10.3 (L) 09/08/2017 1114   HCT 42.6 03/10/2018 1018   HCT 33.0 (L) 09/08/2017 1114   PLT 232 03/10/2018 1018   PLT 393 09/08/2017 1114   MCV 85.7 03/10/2018 1018   MCV 79.2 (L) 09/08/2017 1114   MCH 28.0 03/10/2018 1018   MCHC 32.6 03/10/2018 1018   RDW 17.7 (H) 03/10/2018 1018   RDW 18.3 (H) 09/08/2017 1114   LYMPHSABS 1.1 03/10/2018 1018   LYMPHSABS 1.5 09/08/2017 1114   MONOABS 0.4 03/10/2018 1018   MONOABS 0.4 09/08/2017 1114   EOSABS 0.2 03/10/2018 1018   EOSABS 0.2 09/08/2017 1114   BASOSABS 0.0 03/10/2018 1018   BASOSABS 0.0 09/08/2017 1114   Iron/TIBC/Ferritin/ %Sat    Component Value Date/Time   IRON 116 04/09/2015 1029   TIBC 305 04/09/2015 1029   FERRITIN 36 03/10/2018 1019   FERRITIN 7 (L) 09/08/2017 1114   IRONPCTSAT 38 04/09/2015 1029   IRONPCTSAT 8 (L) 12/23/2012 1101       Assessment & Plan:  83 year old female with hx of IDA, colonic angiodysplastic lesions (prior APC), history of H. pylori negative duodenal ulcer, hiatal hernia and GERD who is here for follow-up.    1.  IDA --likely from chronic GI blood loss.  Improved with  normalization of hemoglobin with periodic IV iron.  She will continue to follow with hematology for blood count and iron store monitoring and IV iron when needed  2.  GERD with history of PUD --asymptomatic.  Continue pantoprazole 40 mg daily  3.  Chronic constipation --improved with daily MiraLAX.  Continue MiraLAX 17 g daily  Follow-up in the office in 6 to 12 months, sooner if needed  15 minutes spent with the patient today. Greater than 50% was spent in counseling and coordination of care with the patient

## 2018-03-29 NOTE — Patient Instructions (Signed)
We have sent the following medications to your pharmacy for you to pick up at your convenience: Pantoprazole 40 mg daily Miralax  Please follow up with Dr Rhea BeltonPyrtle in 6 months.  If you are age 265 or older, your body mass index should be between 23-30. Your Body mass index is 33.54 kg/m. If this is out of the aforementioned range listed, please consider follow up with your Primary Care Provider.  If you are age 81 or younger, your body mass index should be between 19-25. Your Body mass index is 33.54 kg/m. If this is out of the aformentioned range listed, please consider follow up with your Primary Care Provider.

## 2018-04-20 ENCOUNTER — Ambulatory Visit
Admission: RE | Admit: 2018-04-20 | Discharge: 2018-04-20 | Disposition: A | Discharge status: Home / Self Care | Payer: Medicare Other | Source: Ambulatory Visit | Attending: Internal Medicine | Admitting: Internal Medicine

## 2018-04-20 DIAGNOSIS — Z1231 Encounter for screening mammogram for malignant neoplasm of breast: Secondary | ICD-10-CM

## 2018-04-21 ENCOUNTER — Inpatient Hospital Stay: Payer: Medicare Other | Attending: Hematology and Oncology

## 2018-04-21 ENCOUNTER — Other Ambulatory Visit: Payer: Self-pay | Admitting: Internal Medicine

## 2018-04-21 DIAGNOSIS — D509 Iron deficiency anemia, unspecified: Secondary | ICD-10-CM | POA: Insufficient documentation

## 2018-04-21 DIAGNOSIS — K5520 Angiodysplasia of colon without hemorrhage: Secondary | ICD-10-CM

## 2018-04-21 DIAGNOSIS — R928 Other abnormal and inconclusive findings on diagnostic imaging of breast: Secondary | ICD-10-CM

## 2018-04-21 LAB — SAMPLE TO BLOOD BANK

## 2018-04-21 LAB — CBC WITH DIFFERENTIAL/PLATELET
Basophils Absolute: 0 10*3/uL (ref 0.0–0.1)
Basophils Relative: 1 %
EOS ABS: 0.2 10*3/uL (ref 0.0–0.5)
EOS PCT: 4 %
HCT: 39.8 % (ref 34.8–46.6)
Hemoglobin: 13 g/dL (ref 11.6–15.9)
LYMPHS ABS: 1.2 10*3/uL (ref 0.9–3.3)
Lymphocytes Relative: 21 %
MCH: 28.6 pg (ref 25.1–34.0)
MCHC: 32.6 g/dL (ref 31.5–36.0)
MCV: 87.7 fL (ref 79.5–101.0)
MONOS PCT: 6 %
Monocytes Absolute: 0.3 10*3/uL (ref 0.1–0.9)
Neutro Abs: 3.9 10*3/uL (ref 1.5–6.5)
Neutrophils Relative %: 68 %
PLATELETS: 243 10*3/uL (ref 145–400)
RBC: 4.54 MIL/uL (ref 3.70–5.45)
RDW: 13.7 % (ref 11.2–14.5)
WBC: 5.6 10*3/uL (ref 3.9–10.3)

## 2018-04-21 LAB — FERRITIN: Ferritin: 25 ng/mL (ref 11–307)

## 2018-04-22 ENCOUNTER — Telehealth: Payer: Self-pay

## 2018-04-22 NOTE — Telephone Encounter (Signed)
Spoke with pt by phone to let her know Dr Bertis RuddyGorsuch would like to see her again in 3 weeks and do blood work.  Pt request that schedulers speak to her directly before scheduling appt d/t she is dependent on public transportation and also needs to schedule around other appts.  Msg sent to scheduling dept to call pt.

## 2018-04-25 ENCOUNTER — Telehealth: Payer: Self-pay | Admitting: Hematology and Oncology

## 2018-04-25 NOTE — Telephone Encounter (Signed)
Per sch msg, called patient and scheduled patient for lab/dr.  Mailed patient calendar.

## 2018-04-28 ENCOUNTER — Ambulatory Visit
Admission: RE | Admit: 2018-04-28 | Discharge: 2018-04-28 | Disposition: A | Discharge status: Home / Self Care | Payer: Medicare Other | Source: Ambulatory Visit | Attending: Internal Medicine | Admitting: Internal Medicine

## 2018-04-28 ENCOUNTER — Ambulatory Visit: Payer: Medicare Other

## 2018-04-28 DIAGNOSIS — R928 Other abnormal and inconclusive findings on diagnostic imaging of breast: Secondary | ICD-10-CM

## 2018-05-12 ENCOUNTER — Telehealth: Payer: Self-pay | Admitting: Hematology and Oncology

## 2018-05-12 ENCOUNTER — Inpatient Hospital Stay (HOSPITAL_BASED_OUTPATIENT_CLINIC_OR_DEPARTMENT_OTHER): Payer: Medicare Other | Admitting: Hematology and Oncology

## 2018-05-12 ENCOUNTER — Telehealth: Payer: Self-pay

## 2018-05-12 ENCOUNTER — Inpatient Hospital Stay: Payer: Medicare Other | Attending: Hematology and Oncology

## 2018-05-12 DIAGNOSIS — D509 Iron deficiency anemia, unspecified: Secondary | ICD-10-CM | POA: Insufficient documentation

## 2018-05-12 DIAGNOSIS — H579 Unspecified disorder of eye and adnexa: Secondary | ICD-10-CM | POA: Insufficient documentation

## 2018-05-12 DIAGNOSIS — R6889 Other general symptoms and signs: Secondary | ICD-10-CM

## 2018-05-12 DIAGNOSIS — N329 Bladder disorder, unspecified: Secondary | ICD-10-CM

## 2018-05-12 DIAGNOSIS — K5520 Angiodysplasia of colon without hemorrhage: Secondary | ICD-10-CM

## 2018-05-12 DIAGNOSIS — D500 Iron deficiency anemia secondary to blood loss (chronic): Secondary | ICD-10-CM

## 2018-05-12 LAB — CBC WITH DIFFERENTIAL/PLATELET
Basophils Absolute: 0.1 10*3/uL (ref 0.0–0.1)
Basophils Relative: 1 %
EOS ABS: 0.3 10*3/uL (ref 0.0–0.5)
Eosinophils Relative: 5 %
HCT: 39.9 % (ref 34.8–46.6)
HEMOGLOBIN: 13.2 g/dL (ref 11.6–15.9)
LYMPHS ABS: 1.4 10*3/uL (ref 0.9–3.3)
LYMPHS PCT: 26 %
MCH: 29 pg (ref 25.1–34.0)
MCHC: 33.1 g/dL (ref 31.5–36.0)
MCV: 87.7 fL (ref 79.5–101.0)
MONOS PCT: 8 %
Monocytes Absolute: 0.5 10*3/uL (ref 0.1–0.9)
NEUTROS PCT: 60 %
Neutro Abs: 3.3 10*3/uL (ref 1.5–6.5)
Platelets: 260 10*3/uL (ref 145–400)
RBC: 4.55 MIL/uL (ref 3.70–5.45)
RDW: 14.1 % (ref 11.2–14.5)
WBC: 5.6 10*3/uL (ref 3.9–10.3)

## 2018-05-12 LAB — SAMPLE TO BLOOD BANK

## 2018-05-12 LAB — FERRITIN: FERRITIN: 14 ng/mL (ref 11–307)

## 2018-05-12 NOTE — Telephone Encounter (Signed)
Gave copy of Ferritin results [facility]

## 2018-05-12 NOTE — Telephone Encounter (Signed)
Gave avs and calendar ° °

## 2018-05-13 ENCOUNTER — Encounter: Payer: Self-pay | Admitting: Hematology and Oncology

## 2018-05-13 DIAGNOSIS — R6889 Other general symptoms and signs: Secondary | ICD-10-CM | POA: Insufficient documentation

## 2018-05-13 NOTE — Assessment & Plan Note (Signed)
She is currently not anemic although her serum ferritin is trending down I suspect she may need intravenous iron infusion soon She is returning in 3 weeks for blood count check and if she starts to become anemic, we will resume intravenous iron infusion We discussed the risk, benefits, side effects of IV iron and she agreed to proceed The patient is wondering why she cannot get intravenous iron right now I told her, due to insurance reimbursement, typically, despite mild iron deficiency, if she is not anemic, her insurance may not pay for the treatment We will continue to monitor her blood counts carefully

## 2018-05-13 NOTE — Progress Notes (Signed)
[facility] OFFICE PROGRESS NOTE  Melanie Real, MD  ASSESSMENT & PLAN:  Iron deficiency anemia She is currently not anemic although her serum ferritin is trending down I suspect she may need intravenous iron infusion soon She is returning in 3 weeks for blood count check and if she starts to become anemic, we will resume intravenous iron infusion We discussed the risk, benefits, side effects of IV iron and she agreed to proceed The patient is wondering why she cannot get intravenous iron right now I told her, due to insurance reimbursement, typically, despite mild iron deficiency, if she is not anemic, her insurance may not pay for the treatment We will continue to monitor her blood counts carefully  Multiple complaints She has multiple other complaints and felt compelled that she shared with me today.  I tried to address all her other concerns to the best of my abilities:  1) she complained of some bladder issues.  She found that the urologist was not helpful.  We discussed potential referral to see a gynecologist because her bladder issues could be related to pelvic floor laxity 2) she complained of problem with her eyes.  I recommend seeing an ophthalmologist for further evaluation and management 3) she complain of her recent bills in regards to her intravenous iron infusion.  I recommend she discuss this with the financial advocate 4) she complained of her bladder leakage is costing a lot of adult diapers and would like insurance to pay for that.  I would defer to her insurance company and read this regard 5) she complained about her mother having multiple different issues.  I am not able to help her 6) she complained of previous gallbladder issues.  I am not able to help her   No orders of the defined types were placed in this encounter.   INTERVAL HISTORY: Melanie [name] 83 y.o. female returns for further follow-up. The patient is mildly hypertensive today but did  not take her blood pressure medication. She tolerated last iron infusion well. The patient denies any recent signs or symptoms of bleeding such as spontaneous epistaxis, hematuria or hematochezia. She denies recent pica or restless leg.  She had prior history of restless leg syndrome in the past She denies cough, chest pain or shortness of breath  She started to tell me a lot of different issues in regards to her urinary incontinence.  Apparently, she saw a urologist and felt that he was not being helpful and she waited for long time to be seen but yet was not seen in the tests they ordered was not helpful.  She ended up paying a lot for her adult diapers and had difficulties getting insurance to pay for it.  She also started telling me about previous issue with gallbladder, her eyes, her significant bills and also in terms of her mother also having issues with iron deficiency and many other medical problems.  SUMMARY OF HEMATOLOGIC HISTORY:  The patient has been complaining of fatigue and was found to have severe iron deficiency anemia. Approximately a year or 2 ago, she received 2 units of blood transfusion because of this. She had extensive GI evaluation with EGD and colonoscopy and was never found to have any sort of GI bleed. She has received 4 intravenous doses of iron infusion and the last infusion in 3 months ago. Bone marrow aspirate and biopsy in January 2013 showed no evidence of malignancy or persistent absence of iron stores. In 2014, she  received 6 doses of intravenous iron infusion. She was referred to gastroenterology for further evaluation due to suspected GI bleed. Repeat GI endoscopy dated 12/26/2013 showed angiodysplastic lesion and moderate diverticulosis. The patient had received numerous intravenous iron infusion almost on a monthly basis from 03/15/2013 to 01/17/15. Her last intravenous iron was in July 2016, and then resumed in 2018  I have reviewed the past medical history, past  surgical history, social history and family history with the patient and they are unchanged from previous note.  ALLERGIES:  is allergic to cephalexin; clindamycin/lincomycin; codeine; levofloxacin; morphine and related; other; quinolones; sulfa antibiotics; tramadol; amoxicillin; fexofenadine hcl; floxin [ocuflox]; neurontin [gabapentin]; and septra [bactrim].  MEDICATIONS:  Current Outpatient Medications  Medication Sig Dispense Refill  . Vitamin D, Ergocalciferol, (DRISDOL) 50000 units CAPS capsule Take 50,000 Units by mouth daily.    Marland Kitchen. aspirin 81 MG tablet Take 81 mg by mouth daily.     . Cyanocobalamin (VITAMIN B12 PO) Take 500 mcg by mouth daily.     Marland Kitchen. erythromycin ophthalmic ointment Place 1 application into both eyes at bedtime.    . folic acid (FOLVITE) 1 MG tablet TAKE 1 TABLET (1 MG TOTAL) BY MOUTH DAILY. 90 tablet 3  . magnesium oxide (MAG-OX) 400 MG tablet Take 400 mg by mouth daily.    . Menthol-Methyl Salicylate (MUSCLE RUB) 10-15 % CREA Apply 1 application topically as needed for muscle pain (apply to leg as needed).    . metoprolol (LOPRESSOR) 50 MG tablet Take 1 tablet (50 mg total) by mouth 2 (two) times daily. 180 tablet 1  . pantoprazole (PROTONIX) 40 MG tablet Take 1 tablet (40 mg total) by mouth daily. 90 tablet 1  . polyethylene glycol powder (GLYCOLAX/MIRALAX) powder Take 17 g by mouth daily. 1051 g 1  . prednisoLONE acetate (PRED FORTE) 1 % ophthalmic suspension Place 1 drop into both eyes 2 (two) times daily.  0  . RESTASIS MULTIDOSE 0.05 % ophthalmic emulsion Place 1 drop into both eyes 2 (two) times daily.  6  . UNABLE TO FIND Med Name:  [facility] Lidocaine Cream over the counter as needed for joint and muscle pain     No current facility-administered medications for this visit.      REVIEW OF SYSTEMS:   Constitutional: Denies fevers, chills or night sweats Eyes: Denies blurriness of vision Ears, nose, mouth, throat, and face: Denies mucositis or sore  throat Respiratory: Denies cough, dyspnea or wheezes Cardiovascular: Denies palpitation, chest discomfort or lower extremity swelling Gastrointestinal:  Denies nausea, heartburn or change in bowel habits Skin: Denies abnormal skin rashes Lymphatics: Denies new lymphadenopathy or easy bruising Neurological:Denies numbness, tingling or new weaknesses Behavioral/Psych: Mood is stable, no new changes  All other systems were reviewed with the patient and are negative.  PHYSICAL EXAMINATION: ECOG PERFORMANCE STATUS: 1 - Symptomatic but completely ambulatory  Vitals:   05/12/18 0824  BP: (!) 164/96  Pulse: 77  Resp: 18  Temp: 97.8 F (36.6 C)  SpO2: 98%   Filed Weights   05/12/18 0824  Weight: 186 lb 11.2 oz (84.7 kg)    GENERAL:alert, no distress and comfortable SKIN: skin color, texture, turgor are normal, no rashes or significant lesions EYES: normal, Conjunctiva are pink and non-injected, sclera clear OROPHARYNX:no exudate, no erythema and lips, buccal mucosa, and tongue normal  NECK: supple, thyroid normal size, non-tender, without nodularity LYMPH:  no palpable lymphadenopathy in the cervical, axillary or inguinal LUNGS: clear to auscultation and percussion with normal  breathing effort HEART: regular rate & rhythm and no murmurs and no lower extremity edema ABDOMEN:abdomen soft, non-tender and normal bowel sounds Musculoskeletal:no cyanosis of digits and no clubbing  NEURO: alert & oriented x 3 with fluent speech, no focal motor/sensory deficits  LABORATORY DATA:  I have reviewed the data as listed     Component Value Date/Time   NA 140 11/02/2016 1403   NA 144 02/12/2014 0849   K 4.3 11/02/2016 1403   K 3.6 02/12/2014 0849   CL 106 11/02/2016 1403   CL 109 (H) 12/23/2012 1100   CO2 28 11/02/2016 1403   CO2 24 02/12/2014 0849   GLUCOSE 99 11/02/2016 1403   GLUCOSE 152 (H) 02/12/2014 0849   GLUCOSE 106 (H) 12/23/2012 1100   BUN 18 11/02/2016 1403   BUN 15.4  02/12/2014 0849   CREATININE 0.75 11/02/2016 1403   CREATININE 0.8 02/12/2014 0849   CALCIUM 10.6 (H) 11/02/2016 1403   CALCIUM 10.7 (H) 02/12/2014 0849   PROT 7.1 08/22/2015 1041   PROT 6.3 (L) 02/12/2014 0849   ALBUMIN 4.1 08/22/2015 1041   ALBUMIN 3.4 (L) 02/12/2014 0849   AST 17 08/22/2015 1041   AST 13 02/12/2014 0849   ALT 16 08/22/2015 1041   ALT 8 02/12/2014 0849   ALKPHOS 89 08/22/2015 1041   ALKPHOS 78 02/12/2014 0849   BILITOT 0.5 08/22/2015 1041   BILITOT 0.25 02/12/2014 0849   GFRNONAA 80 (L) 08/22/2011 1205   GFRAA >90 08/22/2011 1205    No results found for: SPEP, UPEP  Lab Results  Component Value Date   WBC 5.6 05/12/2018   NEUTROABS 3.3 05/12/2018   HGB 13.2 05/12/2018   HCT 39.9 05/12/2018   MCV 87.7 05/12/2018   PLT 260 05/12/2018      Chemistry      Component Value Date/Time   NA 140 11/02/2016 1403   NA 144 02/12/2014 0849   K 4.3 11/02/2016 1403   K 3.6 02/12/2014 0849   CL 106 11/02/2016 1403   CL 109 (H) 12/23/2012 1100   CO2 28 11/02/2016 1403   CO2 24 02/12/2014 0849   BUN 18 11/02/2016 1403   BUN 15.4 02/12/2014 0849   CREATININE 0.75 11/02/2016 1403   CREATININE 0.8 02/12/2014 0849      Component Value Date/Time   CALCIUM 10.6 (H) 11/02/2016 1403   CALCIUM 10.7 (H) 02/12/2014 0849   ALKPHOS 89 08/22/2015 1041   ALKPHOS 78 02/12/2014 0849   AST 17 08/22/2015 1041   AST 13 02/12/2014 0849   ALT 16 08/22/2015 1041   ALT 8 02/12/2014 0849   BILITOT 0.5 08/22/2015 1041   BILITOT 0.25 02/12/2014 0849      I spent 15 minutes counseling the patient face to face. The total time spent in the appointment was 20 minutes and more than 50% was on counseling.   All questions were answered. The patient knows to call the clinic with any problems, questions or concerns. No barriers to learning was detected.    [name], MD 8/2/201911:32 AM

## 2018-05-13 NOTE — Assessment & Plan Note (Addendum)
She has multiple other complaints and felt compelled that she shared with me today.  I tried to address all her other concerns to the best of my abilities:  1) she complained of some bladder issues.  She found that the urologist was not helpful.  We discussed potential referral to see a gynecologist because her bladder issues could be related to pelvic floor laxity 2) she complained of problem with her eyes.  I recommend seeing an ophthalmologist for further evaluation and management 3) she complain of her recent bills in regards to her intravenous iron infusion.  I recommend she discuss this with the financial advocate 4) she complained of her bladder leakage is costing a lot of adult diapers and would like insurance to pay for that.  I would defer to her insurance company and read this regard 5) she complained about her mother having multiple different issues.  I am not able to help her 6) she complained of previous gallbladder issues.  I am not able to help her

## 2018-06-02 ENCOUNTER — Inpatient Hospital Stay: Payer: Medicare Other

## 2018-06-02 ENCOUNTER — Other Ambulatory Visit: Payer: Self-pay | Admitting: Hematology and Oncology

## 2018-06-02 VITALS — BP 135/65 | HR 67 | Temp 98.0°F | Resp 18

## 2018-06-02 DIAGNOSIS — D509 Iron deficiency anemia, unspecified: Secondary | ICD-10-CM

## 2018-06-02 DIAGNOSIS — K5520 Angiodysplasia of colon without hemorrhage: Secondary | ICD-10-CM

## 2018-06-02 LAB — CBC WITH DIFFERENTIAL/PLATELET
BASOS ABS: 0 10*3/uL (ref 0.0–0.1)
Basophils Relative: 0 %
EOS PCT: 4 %
Eosinophils Absolute: 0.2 10*3/uL (ref 0.0–0.5)
HCT: 41 % (ref 34.8–46.6)
Hemoglobin: 13.5 g/dL (ref 11.6–15.9)
LYMPHS ABS: 1.4 10*3/uL (ref 0.9–3.3)
LYMPHS PCT: 28 %
MCH: 29.1 pg (ref 25.1–34.0)
MCHC: 32.9 g/dL (ref 31.5–36.0)
MCV: 88.4 fL (ref 79.5–101.0)
Monocytes Absolute: 0.4 10*3/uL (ref 0.1–0.9)
Monocytes Relative: 9 %
NEUTROS ABS: 3 10*3/uL (ref 1.5–6.5)
NEUTROS PCT: 59 %
PLATELETS: 273 10*3/uL (ref 145–400)
RBC: 4.64 MIL/uL (ref 3.70–5.45)
RDW: 13.6 % (ref 11.2–14.5)
WBC: 5 10*3/uL (ref 3.9–10.3)

## 2018-06-02 LAB — SAMPLE TO BLOOD BANK

## 2018-06-02 LAB — FERRITIN: FERRITIN: 10 ng/mL — AB (ref 11–307)

## 2018-06-02 MED ORDER — DIPHENHYDRAMINE HCL 25 MG PO CAPS
ORAL_CAPSULE | ORAL | Status: AC
  Filled 2018-06-02: qty 1

## 2018-06-02 MED ORDER — ACETAMINOPHEN 500 MG PO TABS
ORAL_TABLET | ORAL | Status: AC
  Filled 2018-06-02: qty 2

## 2018-06-02 MED ORDER — SODIUM CHLORIDE 0.9 % IV SOLN
510.0000 mg | Freq: Once | INTRAVENOUS | Status: AC
  Administered 2018-06-02: 510 mg via INTRAVENOUS
  Filled 2018-06-02: qty 17

## 2018-06-02 MED ORDER — ACETAMINOPHEN 325 MG PO TABS
650.0000 mg | ORAL_TABLET | Freq: Once | ORAL | Status: AC
  Administered 2018-06-02: 650 mg via ORAL

## 2018-06-02 MED ORDER — DIPHENHYDRAMINE HCL 25 MG PO CAPS
25.0000 mg | ORAL_CAPSULE | Freq: Once | ORAL | Status: AC
  Administered 2018-06-02: 25 mg via ORAL

## 2018-06-02 MED ORDER — SODIUM CHLORIDE 0.9 % IV SOLN
Freq: Once | INTRAVENOUS | Status: AC
  Administered 2018-06-02: 15:00:00 via INTRAVENOUS
  Filled 2018-06-02: qty 250

## 2018-06-02 MED ORDER — ACETAMINOPHEN 325 MG PO TABS
ORAL_TABLET | ORAL | Status: AC
  Filled 2018-06-02: qty 2

## 2018-06-02 NOTE — Patient Instructions (Signed)

## 2018-07-14 ENCOUNTER — Other Ambulatory Visit: Payer: Medicare Other

## 2018-07-14 ENCOUNTER — Ambulatory Visit: Payer: Medicare Other | Admitting: Hematology and Oncology

## 2018-07-15 ENCOUNTER — Encounter: Payer: Self-pay | Admitting: Hematology and Oncology

## 2018-07-15 ENCOUNTER — Inpatient Hospital Stay: Payer: Medicare Other | Attending: Hematology and Oncology

## 2018-07-15 ENCOUNTER — Other Ambulatory Visit: Payer: Self-pay | Admitting: Hematology and Oncology

## 2018-07-15 ENCOUNTER — Inpatient Hospital Stay (HOSPITAL_BASED_OUTPATIENT_CLINIC_OR_DEPARTMENT_OTHER): Payer: Medicare Other | Admitting: Hematology and Oncology

## 2018-07-15 VITALS — BP 148/71 | HR 78 | Temp 97.8°F | Resp 18 | Ht 62.0 in | Wt 185.2 lb

## 2018-07-15 DIAGNOSIS — K5520 Angiodysplasia of colon without hemorrhage: Secondary | ICD-10-CM

## 2018-07-15 DIAGNOSIS — D509 Iron deficiency anemia, unspecified: Secondary | ICD-10-CM

## 2018-07-15 DIAGNOSIS — D500 Iron deficiency anemia secondary to blood loss (chronic): Secondary | ICD-10-CM

## 2018-07-15 DIAGNOSIS — K5730 Diverticulosis of large intestine without perforation or abscess without bleeding: Secondary | ICD-10-CM | POA: Insufficient documentation

## 2018-07-15 DIAGNOSIS — R21 Rash and other nonspecific skin eruption: Secondary | ICD-10-CM | POA: Insufficient documentation

## 2018-07-15 LAB — CBC WITH DIFFERENTIAL (CANCER CENTER ONLY)
Basophils Absolute: 0 10*3/uL (ref 0.0–0.1)
Basophils Relative: 1 %
EOS ABS: 0.3 10*3/uL (ref 0.0–0.5)
Eosinophils Relative: 6 %
HEMATOCRIT: 41.6 % (ref 34.8–46.6)
HEMOGLOBIN: 13.7 g/dL (ref 11.6–15.9)
LYMPHS ABS: 1.2 10*3/uL (ref 0.9–3.3)
Lymphocytes Relative: 27 %
MCH: 28.8 pg (ref 25.1–34.0)
MCHC: 33 g/dL (ref 31.5–36.0)
MCV: 87.3 fL (ref 79.5–101.0)
MONOS PCT: 7 %
Monocytes Absolute: 0.3 10*3/uL (ref 0.1–0.9)
NEUTROS ABS: 2.7 10*3/uL (ref 1.5–6.5)
NEUTROS PCT: 59 %
Platelet Count: 247 10*3/uL (ref 145–400)
RBC: 4.77 MIL/uL (ref 3.70–5.45)
RDW: 14.1 % (ref 11.2–14.5)
WBC: 4.6 10*3/uL (ref 3.9–10.3)

## 2018-07-15 LAB — SAMPLE TO BLOOD BANK

## 2018-07-15 MED ORDER — POLYETHYLENE GLYCOL 3350 17 GM/SCOOP PO POWD: 17.0000 g | Freq: Every day | ORAL | 1 refills | Status: AC | PRN

## 2018-07-15 NOTE — Progress Notes (Signed)
[facility] OFFICE PROGRESS NOTE  [name], MD  ASSESSMENT & PLAN:  Iron deficiency anemia She is not anemic.  Iron studies was not done but she will return in 6 weeks for repeat follow-up.  We will give her intravenous iron infusion if she become iron deficient again.  Skin rash She has nonspecific skin rash but is seeing a dermatologist for further management.  I will defer to them for further follow-up   Orders Placed This Encounter  Procedures  . CBC with Differential (Cancer Center Only)    Standing Status:   Future    Number of Occurrences:   1    Standing Expiration Date:   08/15/2018    INTERVAL HISTORY: Melanie [name] 83 y.o. female returns for further follow-up. The patient denies any recent signs or symptoms of bleeding such as spontaneous epistaxis, hematuria or hematochezia. She denies pica. She complained of some skin rash and nail infection with appointment pending to see dermatologist She denies recent infection, fever or chills  SUMMARY OF HEMATOLOGIC HISTORY:  The patient has been complaining of fatigue and was found to have severe iron deficiency anemia. Approximately a year or 2 ago, she received 2 units of blood transfusion because of this. She had extensive GI evaluation with EGD and colonoscopy and was never found to have any sort of GI bleed. She has received 4 intravenous doses of iron infusion and the last infusion in 3 months ago. Bone marrow aspirate and biopsy in January 2013 showed no evidence of malignancy or persistent absence of iron stores. In 2014, she received 6 doses of intravenous iron infusion. She was referred to gastroenterology for further evaluation due to suspected GI bleed. Repeat GI endoscopy dated 12/26/2013 showed angiodysplastic lesion and moderate diverticulosis. The patient had received numerous intravenous iron infusion almost on a monthly basis from 03/15/2013 to 01/17/15. Her last intravenous iron was in July 2016,  and then resumed in 2018  I have reviewed the past medical history, past surgical history, social history and family history with the patient and they are unchanged from previous note.  ALLERGIES:  is allergic to cephalexin; clindamycin/lincomycin; codeine; levofloxacin; morphine and related; other; quinolones; sulfa antibiotics; tramadol; amoxicillin; fexofenadine hcl; floxin [ocuflox]; neurontin [gabapentin]; and septra [bactrim].  MEDICATIONS:  Current Outpatient Medications  Medication Sig Dispense Refill  . cholecalciferol (VITAMIN D) 1000 units tablet Take 5,000 Units by mouth daily.    Marland Kitchen aspirin 81 MG tablet Take 81 mg by mouth daily.     . Cyanocobalamin (VITAMIN B12 PO) Take 500 mcg by mouth daily.     Marland Kitchen erythromycin ophthalmic ointment Place 1 application into both eyes at bedtime.    . folic acid (FOLVITE) 1 MG tablet TAKE 1 TABLET (1 MG TOTAL) BY MOUTH DAILY. 90 tablet 3  . magnesium oxide (MAG-OX) 400 MG tablet Take 400 mg by mouth daily.    . Menthol-Methyl Salicylate (MUSCLE RUB) 10-15 % CREA Apply 1 application topically as needed for muscle pain (apply to leg as needed).    . metoprolol (LOPRESSOR) 50 MG tablet Take 1 tablet (50 mg total) by mouth 2 (two) times daily. 180 tablet 1  . pantoprazole (PROTONIX) 40 MG tablet Take 1 tablet (40 mg total) by mouth daily. 90 tablet 1  . polyethylene glycol powder (GLYCOLAX/MIRALAX) powder Take 17 g by mouth daily as needed. 1051 g 1  . UNABLE TO FIND Med Name:  [facility] Lidocaine Cream over the counter as needed for  joint and muscle pain     No current facility-administered medications for this visit.      REVIEW OF SYSTEMS:   Constitutional: Denies fevers, chills or night sweats Eyes: Denies blurriness of vision Ears, nose, mouth, throat, and face: Denies mucositis or sore throat Respiratory: Denies cough, dyspnea or wheezes Cardiovascular: Denies palpitation, chest discomfort or lower extremity swelling Gastrointestinal:   Denies nausea, heartburn or change in bowel habits Lymphatics: Denies new lymphadenopathy or easy bruising Neurological:Denies numbness, tingling or new weaknesses Behavioral/Psych: Mood is stable, no new changes  All other systems were reviewed with the patient and are negative.  PHYSICAL EXAMINATION: ECOG PERFORMANCE STATUS: 1 - Symptomatic but completely ambulatory  Vitals:   07/15/18 1201  BP: (!) 148/71  Pulse: 78  Resp: 18  Temp: 97.8 F (36.6 C)  SpO2: 98%   Filed Weights   07/15/18 1201  Weight: 185 lb 3.2 oz (84 kg)    GENERAL:alert, no distress and comfortable SKIN: skin color, texture, turgor are normal, no rashes or significant lesions EYES: normal, Conjunctiva are pink and non-injected, sclera clear OROPHARYNX:no exudate, no erythema and lips, buccal mucosa, and tongue normal  NECK: supple, thyroid normal size, non-tender, without nodularity LYMPH:  no palpable lymphadenopathy in the cervical, axillary or inguinal LUNGS: clear to auscultation and percussion with normal breathing effort HEART: regular rate & rhythm and no murmurs and no lower extremity edema ABDOMEN:abdomen soft, non-tender and normal bowel sounds Musculoskeletal:no cyanosis of digits and no clubbing  NEURO: alert & oriented x 3 with fluent speech, no focal motor/sensory deficits  LABORATORY DATA:  I have reviewed the data as listed     Component Value Date/Time   NA 140 11/02/2016 1403   NA 144 02/12/2014 0849   K 4.3 11/02/2016 1403   K 3.6 02/12/2014 0849   CL 106 11/02/2016 1403   CL 109 (H) 12/23/2012 1100   CO2 28 11/02/2016 1403   CO2 24 02/12/2014 0849   GLUCOSE 99 11/02/2016 1403   GLUCOSE 152 (H) 02/12/2014 0849   GLUCOSE 106 (H) 12/23/2012 1100   BUN 18 11/02/2016 1403   BUN 15.4 02/12/2014 0849   CREATININE 0.75 11/02/2016 1403   CREATININE 0.8 02/12/2014 0849   CALCIUM 10.6 (H) 11/02/2016 1403   CALCIUM 10.7 (H) 02/12/2014 0849   PROT 7.1 08/22/2015 1041   PROT 6.3  (L) 02/12/2014 0849   ALBUMIN 4.1 08/22/2015 1041   ALBUMIN 3.4 (L) 02/12/2014 0849   AST 17 08/22/2015 1041   AST 13 02/12/2014 0849   ALT 16 08/22/2015 1041   ALT 8 02/12/2014 0849   ALKPHOS 89 08/22/2015 1041   ALKPHOS 78 02/12/2014 0849   BILITOT 0.5 08/22/2015 1041   BILITOT 0.25 02/12/2014 0849   GFRNONAA 80 (L) 08/22/2011 1205   GFRAA >90 08/22/2011 1205    No results found for: SPEP, UPEP  Lab Results  Component Value Date   WBC 4.6 07/15/2018   NEUTROABS 2.7 07/15/2018   HGB 13.7 07/15/2018   HCT 41.6 07/15/2018   MCV 87.3 07/15/2018   PLT 247 07/15/2018      Chemistry      Component Value Date/Time   NA 140 11/02/2016 1403   NA 144 02/12/2014 0849   K 4.3 11/02/2016 1403   K 3.6 02/12/2014 0849   CL 106 11/02/2016 1403   CL 109 (H) 12/23/2012 1100   CO2 28 11/02/2016 1403   CO2 24 02/12/2014 0849   BUN 18 11/02/2016 1403  BUN 15.4 02/12/2014 0849   CREATININE 0.75 11/02/2016 1403   CREATININE 0.8 02/12/2014 0849      Component Value Date/Time   CALCIUM 10.6 (H) 11/02/2016 1403   CALCIUM 10.7 (H) 02/12/2014 0849   ALKPHOS 89 08/22/2015 1041   ALKPHOS 78 02/12/2014 0849   AST 17 08/22/2015 1041   AST 13 02/12/2014 0849   ALT 16 08/22/2015 1041   ALT 8 02/12/2014 0849   BILITOT 0.5 08/22/2015 1041   BILITOT 0.25 02/12/2014 0849      I spent 10 minutes counseling the patient face to face. The total time spent in the appointment was 15 minutes and more than 50% was on counseling.   All questions were answered. The patient knows to call the clinic with any problems, questions or concerns. No barriers to learning was detected.    [name], MD 10/4/20192:36 PM

## 2018-07-15 NOTE — Assessment & Plan Note (Signed)
She is not anemic.  Iron studies was not done but she will return in 6 weeks for repeat follow-up.  We will give her intravenous iron infusion if she become iron deficient again.

## 2018-07-15 NOTE — Assessment & Plan Note (Signed)
She has nonspecific skin rash but is seeing a dermatologist for further management.  I will defer to them for further follow-up

## 2018-08-23 ENCOUNTER — Encounter: Payer: Self-pay | Admitting: Internal Medicine

## 2018-08-25 ENCOUNTER — Inpatient Hospital Stay: Payer: Medicare Other | Attending: Hematology and Oncology

## 2018-08-25 DIAGNOSIS — D509 Iron deficiency anemia, unspecified: Secondary | ICD-10-CM | POA: Diagnosis present

## 2018-08-25 DIAGNOSIS — K5520 Angiodysplasia of colon without hemorrhage: Secondary | ICD-10-CM

## 2018-08-25 DIAGNOSIS — D500 Iron deficiency anemia secondary to blood loss (chronic): Secondary | ICD-10-CM

## 2018-08-25 LAB — IRON AND TIBC
Iron: 70 ug/dL (ref 41–142)
Saturation Ratios: 21 % (ref 21–57)
TIBC: 341 ug/dL (ref 236–444)
UIBC: 271 ug/dL (ref 120–384)

## 2018-08-25 LAB — CBC WITH DIFFERENTIAL/PLATELET
Abs Immature Granulocytes: 0.01 K/uL (ref 0.00–0.07)
Basophils Absolute: 0.1 K/uL (ref 0.0–0.1)
Basophils Relative: 1 %
Eosinophils Absolute: 0.3 K/uL (ref 0.0–0.5)
Eosinophils Relative: 6 %
HCT: 41.2 % (ref 36.0–46.0)
Hemoglobin: 13.3 g/dL (ref 12.0–15.0)
Immature Granulocytes: 0 %
Lymphocytes Relative: 24 %
Lymphs Abs: 1.3 K/uL (ref 0.7–4.0)
MCH: 28.7 pg (ref 26.0–34.0)
MCHC: 32.3 g/dL (ref 30.0–36.0)
MCV: 88.8 fL (ref 80.0–100.0)
Monocytes Absolute: 0.5 K/uL (ref 0.1–1.0)
Monocytes Relative: 9 %
Neutro Abs: 3.3 K/uL (ref 1.7–7.7)
Neutrophils Relative %: 60 %
Platelets: 263 K/uL (ref 150–400)
RBC: 4.64 MIL/uL (ref 3.87–5.11)
RDW: 13.7 % (ref 11.5–15.5)
WBC: 5.5 K/uL (ref 4.0–10.5)
nRBC: 0 % (ref 0.0–0.2)

## 2018-08-25 LAB — FERRITIN: Ferritin: 27 ng/mL (ref 11–307)

## 2018-09-26 ENCOUNTER — Telehealth: Payer: Self-pay

## 2018-09-26 NOTE — Telephone Encounter (Signed)
Called her back and gave below message. She verbalized understanding. 

## 2018-09-26 NOTE — Telephone Encounter (Signed)
She called and left a message. She has appt 12/26 for lab only.  She will be out of town starting 10/14/18 for 4 to 6 weeks. She worried she may need a infusion appt for Iron.

## 2018-09-26 NOTE — Telephone Encounter (Signed)
pls call her back. Should not be a problem getting her treated

## 2018-10-06 ENCOUNTER — Inpatient Hospital Stay: Payer: Medicare Other | Attending: Hematology and Oncology

## 2018-10-06 ENCOUNTER — Other Ambulatory Visit: Payer: Self-pay | Admitting: Hematology and Oncology

## 2018-10-06 DIAGNOSIS — D500 Iron deficiency anemia secondary to blood loss (chronic): Secondary | ICD-10-CM

## 2018-10-06 DIAGNOSIS — D509 Iron deficiency anemia, unspecified: Secondary | ICD-10-CM | POA: Diagnosis present

## 2018-10-06 DIAGNOSIS — K5520 Angiodysplasia of colon without hemorrhage: Secondary | ICD-10-CM

## 2018-10-06 LAB — CBC WITH DIFFERENTIAL/PLATELET
Abs Immature Granulocytes: 0.01 10*3/uL (ref 0.00–0.07)
BASOS ABS: 0 10*3/uL (ref 0.0–0.1)
Basophils Relative: 1 %
EOS ABS: 0.3 10*3/uL (ref 0.0–0.5)
EOS PCT: 6 %
HCT: 40.1 % (ref 36.0–46.0)
Hemoglobin: 12.9 g/dL (ref 12.0–15.0)
IMMATURE GRANULOCYTES: 0 %
LYMPHS PCT: 26 %
Lymphs Abs: 1.4 10*3/uL (ref 0.7–4.0)
MCH: 28.4 pg (ref 26.0–34.0)
MCHC: 32.2 g/dL (ref 30.0–36.0)
MCV: 88.1 fL (ref 80.0–100.0)
Monocytes Absolute: 0.5 10*3/uL (ref 0.1–1.0)
Monocytes Relative: 10 %
NEUTROS PCT: 57 %
NRBC: 0 % (ref 0.0–0.2)
Neutro Abs: 3.2 10*3/uL (ref 1.7–7.7)
Platelets: 280 10*3/uL (ref 150–400)
RBC: 4.55 MIL/uL (ref 3.87–5.11)
RDW: 13.2 % (ref 11.5–15.5)
WBC: 5.5 10*3/uL (ref 4.0–10.5)

## 2018-10-06 LAB — IRON AND TIBC
Iron: 40 ug/dL — ABNORMAL LOW (ref 41–142)
SATURATION RATIOS: 10 % — AB (ref 21–57)
TIBC: 381 ug/dL (ref 236–444)
UIBC: 342 ug/dL (ref 120–384)

## 2018-10-06 LAB — FERRITIN: FERRITIN: 7 ng/mL — AB (ref 11–307)

## 2018-10-07 ENCOUNTER — Telehealth: Payer: Self-pay | Admitting: Hematology and Oncology

## 2018-10-07 NOTE — Telephone Encounter (Signed)
Scheduled appt per 12/26 and 12/27 sch message- pt is aware of appt date and time

## 2018-10-08 ENCOUNTER — Inpatient Hospital Stay: Payer: Medicare Other

## 2018-10-08 VITALS — BP 140/81 | HR 66 | Temp 98.0°F | Resp 18

## 2018-10-08 DIAGNOSIS — K5520 Angiodysplasia of colon without hemorrhage: Secondary | ICD-10-CM

## 2018-10-08 DIAGNOSIS — D509 Iron deficiency anemia, unspecified: Secondary | ICD-10-CM | POA: Diagnosis not present

## 2018-10-08 MED ORDER — DIPHENHYDRAMINE HCL 25 MG PO TABS
25.0000 mg | ORAL_TABLET | Freq: Once | ORAL | Status: AC
  Administered 2018-10-08: 25 mg via ORAL
  Filled 2018-10-08: qty 1

## 2018-10-08 MED ORDER — SODIUM CHLORIDE 0.9 % IV SOLN
510.0000 mg | Freq: Once | INTRAVENOUS | Status: AC
  Administered 2018-10-08: 510 mg via INTRAVENOUS
  Filled 2018-10-08: qty 17

## 2018-10-08 MED ORDER — DIPHENHYDRAMINE HCL 25 MG PO CAPS
ORAL_CAPSULE | ORAL | Status: AC
  Filled 2018-10-08: qty 1

## 2018-10-08 MED ORDER — ACETAMINOPHEN 325 MG PO TABS
ORAL_TABLET | ORAL | Status: AC
  Filled 2018-10-08: qty 2

## 2018-10-08 MED ORDER — ACETAMINOPHEN 325 MG PO TABS
650.0000 mg | ORAL_TABLET | Freq: Once | ORAL | Status: AC
  Administered 2018-10-08: 650 mg via ORAL

## 2018-10-08 MED ORDER — SODIUM CHLORIDE 0.9 % IV SOLN
Freq: Once | INTRAVENOUS | Status: AC
  Administered 2018-10-08: 12:00:00 via INTRAVENOUS
  Filled 2018-10-08: qty 250

## 2018-10-10 ENCOUNTER — Other Ambulatory Visit: Payer: Self-pay | Admitting: Hematology and Oncology

## 2018-10-10 ENCOUNTER — Telehealth: Payer: Self-pay | Admitting: Internal Medicine

## 2018-10-10 ENCOUNTER — Other Ambulatory Visit: Payer: Self-pay | Admitting: Internal Medicine

## 2018-10-10 MED ORDER — PANTOPRAZOLE SODIUM 40 MG PO TBEC: 40.0000 mg | DELAYED_RELEASE_TABLET | Freq: Every day | ORAL | 1 refills | Status: DC

## 2018-10-10 NOTE — Telephone Encounter (Signed)
Refill sent to pharmacy.   

## 2018-10-10 NOTE — Telephone Encounter (Signed)
Pt is needed to get refill for pantoprazole (PROTONIX) 40 MG tablet [[id number]][[id number]]  She will be leaving for out of town for over 30days and needing refill. Pt is sched with dr on 11/16/2018@11am 

## 2018-10-15 ENCOUNTER — Inpatient Hospital Stay: Payer: Medicare Other | Attending: Hematology and Oncology

## 2018-10-15 VITALS — BP 145/72 | HR 81 | Temp 97.8°F | Resp 16

## 2018-10-15 DIAGNOSIS — K5730 Diverticulosis of large intestine without perforation or abscess without bleeding: Secondary | ICD-10-CM | POA: Diagnosis not present

## 2018-10-15 DIAGNOSIS — K5520 Angiodysplasia of colon without hemorrhage: Secondary | ICD-10-CM

## 2018-10-15 DIAGNOSIS — D509 Iron deficiency anemia, unspecified: Secondary | ICD-10-CM | POA: Diagnosis not present

## 2018-10-15 MED ORDER — ACETAMINOPHEN 325 MG PO TABS
ORAL_TABLET | ORAL | Status: AC
  Filled 2018-10-15: qty 2

## 2018-10-15 MED ORDER — SODIUM CHLORIDE 0.9 % IV SOLN
Freq: Once | INTRAVENOUS | Status: AC
  Administered 2018-10-15: 10:00:00 via INTRAVENOUS
  Filled 2018-10-15: qty 250

## 2018-10-15 MED ORDER — DIPHENHYDRAMINE HCL 25 MG PO TABS
25.0000 mg | ORAL_TABLET | Freq: Once | ORAL | Status: AC
  Administered 2018-10-15: 25 mg via ORAL
  Filled 2018-10-15: qty 1

## 2018-10-15 MED ORDER — DIPHENHYDRAMINE HCL 25 MG PO CAPS
ORAL_CAPSULE | ORAL | Status: AC
  Filled 2018-10-15: qty 1

## 2018-10-15 MED ORDER — SODIUM CHLORIDE 0.9 % IV SOLN
510.0000 mg | Freq: Once | INTRAVENOUS | Status: AC
  Administered 2018-10-15: 510 mg via INTRAVENOUS
  Filled 2018-10-15: qty 17

## 2018-10-15 MED ORDER — ACETAMINOPHEN 325 MG PO TABS
650.0000 mg | ORAL_TABLET | Freq: Once | ORAL | Status: AC
  Administered 2018-10-15: 650 mg via ORAL

## 2018-10-15 NOTE — Patient Instructions (Signed)

## 2018-10-15 NOTE — Progress Notes (Signed)
VSs taken post Feraheme by Minerva Ends, RN.  Readings not charted by RN; however, this nurse was informed that pt's VSS.  Pt was stable at discharge by self.

## 2018-11-08 ENCOUNTER — Other Ambulatory Visit: Payer: Self-pay | Admitting: Internal Medicine

## 2018-11-16 ENCOUNTER — Encounter: Payer: Self-pay | Admitting: Internal Medicine

## 2018-11-16 ENCOUNTER — Ambulatory Visit (INDEPENDENT_AMBULATORY_CARE_PROVIDER_SITE_OTHER): Payer: Medicare Other | Admitting: Internal Medicine

## 2018-11-16 VITALS — BP 106/64 | HR 72 | Ht 62.0 in | Wt 189.0 lb

## 2018-11-16 DIAGNOSIS — D500 Iron deficiency anemia secondary to blood loss (chronic): Secondary | ICD-10-CM

## 2018-11-16 DIAGNOSIS — K219 Gastro-esophageal reflux disease without esophagitis: Secondary | ICD-10-CM

## 2018-11-16 DIAGNOSIS — K5909 Other constipation: Secondary | ICD-10-CM | POA: Diagnosis not present

## 2018-11-16 NOTE — Progress Notes (Signed)
   Subjective:    Patient ID: Melanie [name], female    DOB: May 23, 1935, 84 y.o.   MRN: [id number]  HPI Melanie [name] is an 84 year old female with history of iron deficiency anemia, colonic angiodysplastic lesions, history of H. pylori negative duodenal ulcer, hiatal hernia and GERD who is here for follow-up.  Last seen 05/30/2018.  Overall she is doing well.  She received 2 infusions of IV iron about 1 month ago.  Good energy level.  No blood in her stool or melena.  No abdominal pain.  No further right upper quadrant pain.  Constipation under good control with MiraLAX which she uses daily.  Continue pantoprazole 40 mg daily with no heartburn, dysphagia or odynophagia.   Review of Systems As per HPI, otherwise negative IDA  Current Medications, Allergies, Past Medical History, Past Surgical History, Family History and Social History were reviewed in [facility] electronic medical record.     Objective:   Physical Exam BP 106/64   Pulse 72   Ht 5\' 2"  (1.575 m)   Wt 189 lb (85.7 kg)   BMI 34.57 kg/m  Gen: awake, alert, NAD HEENT: anicteric, op clear CV: RRR, no mrg Pulm: CTA b/l Abd: soft, NT/ND, +BS throughout Ext: no c/c/e Neuro: nonfocal  CBC    Component Value Date/Time   WBC 5.5 10/06/2018 0952   RBC 4.55 10/06/2018 0952   HGB 12.9 10/06/2018 0952   HGB 13.7 07/15/2018 1144   HGB 10.3 (L) 09/08/2017 1114   HCT 40.1 10/06/2018 0952   HCT 33.0 (L) 09/08/2017 1114   PLT 280 10/06/2018 0952   PLT 247 07/15/2018 1144   PLT 393 09/08/2017 1114   MCV 88.1 10/06/2018 0952   MCV 79.2 (L) 09/08/2017 1114   MCH 28.4 10/06/2018 0952   MCHC 32.2 10/06/2018 0952   RDW 13.2 10/06/2018 0952   RDW 18.3 (H) 09/08/2017 1114   LYMPHSABS 1.4 10/06/2018 0952   LYMPHSABS 1.5 09/08/2017 1114   MONOABS 0.5 10/06/2018 0952   MONOABS 0.4 09/08/2017 1114   EOSABS 0.3 10/06/2018 0952   EOSABS 0.2 09/08/2017 1114   BASOSABS 0.0 10/06/2018 0952   BASOSABS 0.0 09/08/2017 1114    Iron/TIBC/Ferritin/ %Sat    Component Value Date/Time   IRON 40 (L) 10/06/2018 0951   IRON 116 04/09/2015 1029   TIBC 381 10/06/2018 0951   TIBC 305 04/09/2015 1029   FERRITIN 7 (L) 10/06/2018 0951   FERRITIN 7 (L) 09/08/2017 1114   IRONPCTSAT 10 (L) 10/06/2018 0951   IRONPCTSAT 38 04/09/2015 1029   IRONPCTSAT 8 (L) 12/23/2012 1101        Assessment & Plan:  84 year old female with history of iron deficiency anemia, colonic angiodysplastic lesions, history of H. pylori negative duodenal ulcer, hiatal hernia and GERD who is here for follow-up.  1.  IDA --likely from chronic GI blood loss due to angiodysplastic lesions.  Getting IV iron with Dr. Bertis RuddyGorsuch as needed.  2.  GERD --asymptomatic and well-controlled on pantoprazole.  Continue pantoprazole 40 mg daily  3.  Chronic constipation --continue daily MiraLAX which is working well.  15 minutes spent with the patient today. Greater than 50% was spent in counseling and coordination of care with the patient

## 2018-11-16 NOTE — Patient Instructions (Addendum)
Continue Miralax.  Continue drinking prune juice.  Make sure to call [facility] for the scaling rash in ears and scalp.  Follow up with the cancer center in 2-3 months.  Continue getting IV iron.  Please continue pantoprazole.  Please follow up with Dr Rhea BeltonPyrtle in 1 year.

## 2018-11-24 ENCOUNTER — Telehealth: Payer: Self-pay

## 2018-11-24 ENCOUNTER — Telehealth: Payer: Self-pay | Admitting: Hematology and Oncology

## 2018-11-24 NOTE — Telephone Encounter (Signed)
Called and told her Dr. Bertis Ruddy sent a scheduling message. She verbalized understanding.

## 2018-11-24 NOTE — Telephone Encounter (Signed)
Scheduled appt per 2/13 sch message - pt is aware of appts

## 2018-11-24 NOTE — Telephone Encounter (Signed)
Can you call his office for his progress notes first?

## 2018-11-24 NOTE — Telephone Encounter (Signed)
She called and left a message requesting appt. She saw Dr. Radene Journey recently and was told to call for appt.

## 2018-12-01 ENCOUNTER — Telehealth: Payer: Self-pay

## 2018-12-01 ENCOUNTER — Inpatient Hospital Stay: Payer: Medicare Other | Attending: Hematology and Oncology

## 2018-12-01 DIAGNOSIS — D500 Iron deficiency anemia secondary to blood loss (chronic): Secondary | ICD-10-CM

## 2018-12-01 DIAGNOSIS — D509 Iron deficiency anemia, unspecified: Secondary | ICD-10-CM | POA: Diagnosis not present

## 2018-12-01 DIAGNOSIS — K5520 Angiodysplasia of colon without hemorrhage: Secondary | ICD-10-CM

## 2018-12-01 LAB — CBC WITH DIFFERENTIAL/PLATELET
Abs Immature Granulocytes: 0.01 10*3/uL (ref 0.00–0.07)
BASOS ABS: 0 10*3/uL (ref 0.0–0.1)
Basophils Relative: 1 %
EOS PCT: 6 %
Eosinophils Absolute: 0.2 10*3/uL (ref 0.0–0.5)
HEMATOCRIT: 40.2 % (ref 36.0–46.0)
HEMOGLOBIN: 12.9 g/dL (ref 12.0–15.0)
Immature Granulocytes: 0 %
LYMPHS PCT: 22 %
Lymphs Abs: 0.9 10*3/uL (ref 0.7–4.0)
MCH: 29.3 pg (ref 26.0–34.0)
MCHC: 32.1 g/dL (ref 30.0–36.0)
MCV: 91.2 fL (ref 80.0–100.0)
Monocytes Absolute: 0.3 10*3/uL (ref 0.1–1.0)
Monocytes Relative: 8 %
Neutro Abs: 2.7 10*3/uL (ref 1.7–7.7)
Neutrophils Relative %: 63 %
Platelets: 264 10*3/uL (ref 150–400)
RBC: 4.41 MIL/uL (ref 3.87–5.11)
RDW: 14.8 % (ref 11.5–15.5)
WBC: 4.2 10*3/uL (ref 4.0–10.5)
nRBC: 0 % (ref 0.0–0.2)

## 2018-12-01 LAB — IRON AND TIBC
Iron: 97 ug/dL (ref 41–142)
Saturation Ratios: 31 % (ref 21–57)
TIBC: 314 ug/dL (ref 236–444)
UIBC: 217 ug/dL (ref 120–384)

## 2018-12-01 LAB — FERRITIN: Ferritin: 66 ng/mL (ref 11–307)

## 2018-12-01 NOTE — Telephone Encounter (Signed)
Called and given below message. She verbalized understanding. Mailed her copy of labs and schedule.

## 2018-12-01 NOTE — Telephone Encounter (Signed)
-----   Message from [name], MD sent at 12/01/2018  1:18 PM EST ----- Regarding: labs Call and tell her iron studies are normal and no anemia Return in 3 months with labs only

## 2018-12-07 ENCOUNTER — Telehealth: Payer: Self-pay | Admitting: Internal Medicine

## 2018-12-07 NOTE — Telephone Encounter (Signed)
Noted  

## 2018-12-07 NOTE — Telephone Encounter (Signed)
Pt called to inform that a copy of her CT scan will be sent to [facility] tomorrow - FYI

## 2019-01-15 ENCOUNTER — Other Ambulatory Visit: Payer: Self-pay | Admitting: Internal Medicine

## 2019-01-26 ENCOUNTER — Other Ambulatory Visit: Payer: Self-pay

## 2019-01-26 ENCOUNTER — Telehealth: Payer: Self-pay | Admitting: Hematology and Oncology

## 2019-01-26 ENCOUNTER — Telehealth: Payer: Self-pay

## 2019-01-26 ENCOUNTER — Other Ambulatory Visit: Payer: Self-pay | Admitting: Hematology and Oncology

## 2019-01-26 ENCOUNTER — Encounter: Payer: Self-pay | Admitting: Hematology and Oncology

## 2019-01-26 ENCOUNTER — Inpatient Hospital Stay: Payer: Medicare Other | Attending: Hematology and Oncology

## 2019-01-26 DIAGNOSIS — D509 Iron deficiency anemia, unspecified: Secondary | ICD-10-CM | POA: Insufficient documentation

## 2019-01-26 DIAGNOSIS — K5520 Angiodysplasia of colon without hemorrhage: Secondary | ICD-10-CM

## 2019-01-26 DIAGNOSIS — D500 Iron deficiency anemia secondary to blood loss (chronic): Secondary | ICD-10-CM

## 2019-01-26 LAB — CBC WITH DIFFERENTIAL/PLATELET
Abs Immature Granulocytes: 0.01 10*3/uL (ref 0.00–0.07)
Basophils Absolute: 0 10*3/uL (ref 0.0–0.1)
Basophils Relative: 1 %
Eosinophils Absolute: 0.2 10*3/uL (ref 0.0–0.5)
Eosinophils Relative: 3 %
HCT: 32.4 % — ABNORMAL LOW (ref 36.0–46.0)
Hemoglobin: 10.1 g/dL — ABNORMAL LOW (ref 12.0–15.0)
Immature Granulocytes: 0 %
Lymphocytes Relative: 21 %
Lymphs Abs: 1.3 10*3/uL (ref 0.7–4.0)
MCH: 27.3 pg (ref 26.0–34.0)
MCHC: 31.2 g/dL (ref 30.0–36.0)
MCV: 87.6 fL (ref 80.0–100.0)
Monocytes Absolute: 0.4 10*3/uL (ref 0.1–1.0)
Monocytes Relative: 7 %
Neutro Abs: 4.1 10*3/uL (ref 1.7–7.7)
Neutrophils Relative %: 68 %
Platelets: 341 10*3/uL (ref 150–400)
RBC: 3.7 MIL/uL — ABNORMAL LOW (ref 3.87–5.11)
RDW: 13.3 % (ref 11.5–15.5)
WBC: 6 10*3/uL (ref 4.0–10.5)
nRBC: 0 % (ref 0.0–0.2)

## 2019-01-26 LAB — IRON AND TIBC
Iron: 16 ug/dL — ABNORMAL LOW (ref 41–142)
Saturation Ratios: 4 % — ABNORMAL LOW (ref 21–57)
TIBC: 414 ug/dL (ref 236–444)
UIBC: 397 ug/dL — ABNORMAL HIGH (ref 120–384)

## 2019-01-26 LAB — FERRITIN: Ferritin: <4 ng/mL — ABNORMAL LOW (ref 11–307)

## 2019-01-26 NOTE — Telephone Encounter (Signed)
Called and given below message. She verbalized understanding. She would like to come next Wednesday. Scheduling message sent.

## 2019-01-26 NOTE — Telephone Encounter (Signed)
-----   Message from [name], MD sent at 01/26/2019 11:27 AM EDT ----- Regarding: cbc and iron studies Her ferritin is not back yet Based on her anemia she will need IV iron again Next available IV iron is probably next Wed or Friday. She will need 2 doses a week apart Please call her this afternoon after ferritin is back and ask her when she wants the infusion and then send scheduling msg. I do not need to see her. Keep her labs appointment as scheduled, no change

## 2019-01-26 NOTE — Telephone Encounter (Signed)
Schedule iv iron for 4/22 and 4/29 per sch msg. Called and spoke with patient. Confirmed dates and times

## 2019-02-01 ENCOUNTER — Telehealth: Payer: Self-pay | Admitting: Hematology and Oncology

## 2019-02-01 ENCOUNTER — Inpatient Hospital Stay: Payer: Medicare Other

## 2019-02-01 ENCOUNTER — Other Ambulatory Visit: Payer: Self-pay

## 2019-02-01 VITALS — BP 132/74 | HR 73 | Temp 98.0°F | Resp 18

## 2019-02-01 DIAGNOSIS — K5520 Angiodysplasia of colon without hemorrhage: Secondary | ICD-10-CM

## 2019-02-01 DIAGNOSIS — D509 Iron deficiency anemia, unspecified: Secondary | ICD-10-CM

## 2019-02-01 MED ORDER — DIPHENHYDRAMINE HCL 25 MG PO CAPS
25.0000 mg | ORAL_CAPSULE | Freq: Once | ORAL | Status: AC
  Administered 2019-02-01: 25 mg via ORAL

## 2019-02-01 MED ORDER — ACETAMINOPHEN 325 MG PO TABS
650.0000 mg | ORAL_TABLET | Freq: Once | ORAL | Status: AC
  Administered 2019-02-01: 650 mg via ORAL

## 2019-02-01 MED ORDER — SODIUM CHLORIDE 0.9 % IV SOLN
Freq: Once | INTRAVENOUS | Status: AC
  Administered 2019-02-01: 10:00:00 via INTRAVENOUS
  Filled 2019-02-01: qty 250

## 2019-02-01 MED ORDER — ACETAMINOPHEN 325 MG PO TABS
ORAL_TABLET | ORAL | Status: AC
  Filled 2019-02-01: qty 2

## 2019-02-01 MED ORDER — SODIUM CHLORIDE 0.9 % IV SOLN
510.0000 mg | Freq: Once | INTRAVENOUS | Status: AC
  Administered 2019-02-01: 510 mg via INTRAVENOUS
  Filled 2019-02-01: qty 17

## 2019-02-01 MED ORDER — DIPHENHYDRAMINE HCL 25 MG PO CAPS
ORAL_CAPSULE | ORAL | Status: AC
  Filled 2019-02-01: qty 1

## 2019-02-01 NOTE — Telephone Encounter (Signed)
Printed copy of last weeks labs for patient. Release [id number]

## 2019-02-01 NOTE — Patient Instructions (Signed)

## 2019-02-08 ENCOUNTER — Other Ambulatory Visit: Payer: Self-pay

## 2019-02-08 ENCOUNTER — Inpatient Hospital Stay: Payer: Medicare Other

## 2019-02-08 VITALS — BP 146/75 | HR 83 | Temp 98.0°F | Resp 18

## 2019-02-08 DIAGNOSIS — D509 Iron deficiency anemia, unspecified: Secondary | ICD-10-CM

## 2019-02-08 DIAGNOSIS — K5520 Angiodysplasia of colon without hemorrhage: Secondary | ICD-10-CM

## 2019-02-08 MED ORDER — ACETAMINOPHEN 325 MG PO TABS
ORAL_TABLET | ORAL | Status: AC
  Filled 2019-02-08: qty 2

## 2019-02-08 MED ORDER — DIPHENHYDRAMINE HCL 25 MG PO CAPS
ORAL_CAPSULE | ORAL | Status: AC
  Filled 2019-02-08: qty 1

## 2019-02-08 MED ORDER — DIPHENHYDRAMINE HCL 25 MG PO CAPS
25.0000 mg | ORAL_CAPSULE | Freq: Once | ORAL | Status: AC
  Administered 2019-02-08: 09:00:00 25 mg via ORAL

## 2019-02-08 MED ORDER — ACETAMINOPHEN 325 MG PO TABS
650.0000 mg | ORAL_TABLET | Freq: Once | ORAL | Status: AC
  Administered 2019-02-08: 09:00:00 650 mg via ORAL

## 2019-02-08 MED ORDER — SODIUM CHLORIDE 0.9 % IV SOLN
510.0000 mg | Freq: Once | INTRAVENOUS | Status: AC
  Administered 2019-02-08: 10:00:00 510 mg via INTRAVENOUS
  Filled 2019-02-08: qty 17

## 2019-02-08 MED ORDER — SODIUM CHLORIDE 0.9 % IV SOLN
Freq: Once | INTRAVENOUS | Status: AC
  Administered 2019-02-08: 09:00:00 via INTRAVENOUS
  Filled 2019-02-08: qty 250

## 2019-02-08 NOTE — Patient Instructions (Signed)

## 2019-02-17 ENCOUNTER — Other Ambulatory Visit: Payer: Self-pay

## 2019-03-13 ENCOUNTER — Encounter: Payer: Self-pay | Admitting: Hematology and Oncology

## 2019-03-30 ENCOUNTER — Telehealth: Payer: Self-pay | Admitting: Hematology and Oncology

## 2019-03-30 ENCOUNTER — Other Ambulatory Visit: Payer: Self-pay

## 2019-03-30 ENCOUNTER — Inpatient Hospital Stay: Payer: Medicare Other | Attending: Hematology and Oncology

## 2019-03-30 ENCOUNTER — Other Ambulatory Visit: Payer: Self-pay | Admitting: Hematology and Oncology

## 2019-03-30 ENCOUNTER — Telehealth: Payer: Self-pay | Admitting: *Deleted

## 2019-03-30 DIAGNOSIS — K5520 Angiodysplasia of colon without hemorrhage: Secondary | ICD-10-CM

## 2019-03-30 DIAGNOSIS — D509 Iron deficiency anemia, unspecified: Secondary | ICD-10-CM | POA: Insufficient documentation

## 2019-03-30 DIAGNOSIS — D500 Iron deficiency anemia secondary to blood loss (chronic): Secondary | ICD-10-CM

## 2019-03-30 LAB — CBC WITH DIFFERENTIAL/PLATELET
Abs Immature Granulocytes: 0.01 10*3/uL (ref 0.00–0.07)
Basophils Absolute: 0 10*3/uL (ref 0.0–0.1)
Basophils Relative: 0 %
Eosinophils Absolute: 0.2 10*3/uL (ref 0.0–0.5)
Eosinophils Relative: 3 %
HCT: 30 % — ABNORMAL LOW (ref 36.0–46.0)
Hemoglobin: 9.1 g/dL — ABNORMAL LOW (ref 12.0–15.0)
Immature Granulocytes: 0 %
Lymphocytes Relative: 21 %
Lymphs Abs: 1 10*3/uL (ref 0.7–4.0)
MCH: 26 pg (ref 26.0–34.0)
MCHC: 30.3 g/dL (ref 30.0–36.0)
MCV: 85.7 fL (ref 80.0–100.0)
Monocytes Absolute: 0.4 10*3/uL (ref 0.1–1.0)
Monocytes Relative: 9 %
Neutro Abs: 3.2 10*3/uL (ref 1.7–7.7)
Neutrophils Relative %: 67 %
Platelets: 397 10*3/uL (ref 150–400)
RBC: 3.5 MIL/uL — ABNORMAL LOW (ref 3.87–5.11)
RDW: 16.7 % — ABNORMAL HIGH (ref 11.5–15.5)
WBC: 4.8 10*3/uL (ref 4.0–10.5)
nRBC: 0 % (ref 0.0–0.2)

## 2019-03-30 LAB — IRON AND TIBC
Iron: 14 ug/dL — ABNORMAL LOW (ref 41–142)
Saturation Ratios: 3 % — ABNORMAL LOW (ref 21–57)
TIBC: 411 ug/dL (ref 236–444)
UIBC: 397 ug/dL — ABNORMAL HIGH (ref 120–384)

## 2019-03-30 LAB — FERRITIN: Ferritin: 8 ng/mL — ABNORMAL LOW (ref 11–307)

## 2019-03-30 NOTE — Telephone Encounter (Signed)
-----   Message from Heath Lark, MD sent at 03/30/2019 12:43 PM EDT ----- Regarding: low iron Pls call and let her know she is anemic again I will send scheduling msg for IV iron weekly x 2 I will see her in 1 month instead of 2 months

## 2019-03-30 NOTE — Telephone Encounter (Signed)
Spoke with patient re June/July appointments.  °

## 2019-03-30 NOTE — Telephone Encounter (Signed)
Patient notified of lab results. She states she was sure it was time for more iron, she has been feeling it. She is very happy with this plan. Advised patient to call this office with any questions or concerns in the interm.

## 2019-04-07 ENCOUNTER — Other Ambulatory Visit: Payer: Self-pay

## 2019-04-07 ENCOUNTER — Inpatient Hospital Stay: Payer: Medicare Other

## 2019-04-07 ENCOUNTER — Telehealth: Payer: Self-pay | Admitting: Hematology and Oncology

## 2019-04-07 VITALS — BP 146/79 | HR 74 | Temp 98.1°F | Resp 18

## 2019-04-07 DIAGNOSIS — D509 Iron deficiency anemia, unspecified: Secondary | ICD-10-CM

## 2019-04-07 DIAGNOSIS — K5520 Angiodysplasia of colon without hemorrhage: Secondary | ICD-10-CM

## 2019-04-07 MED ORDER — ACETAMINOPHEN 325 MG PO TABS
650.0000 mg | ORAL_TABLET | Freq: Once | ORAL | Status: AC
  Administered 2019-04-07: 650 mg via ORAL

## 2019-04-07 MED ORDER — ACETAMINOPHEN 325 MG PO TABS
ORAL_TABLET | ORAL | Status: AC
  Filled 2019-04-07: qty 2

## 2019-04-07 MED ORDER — SODIUM CHLORIDE 0.9 % IV SOLN
510.0000 mg | Freq: Once | INTRAVENOUS | Status: AC
  Administered 2019-04-07: 510 mg via INTRAVENOUS
  Filled 2019-04-07: qty 17

## 2019-04-07 MED ORDER — SODIUM CHLORIDE 0.9 % IV SOLN
Freq: Once | INTRAVENOUS | Status: AC
  Administered 2019-04-07: 10:00:00 via INTRAVENOUS
  Filled 2019-04-07: qty 250

## 2019-04-07 MED ORDER — DIPHENHYDRAMINE HCL 25 MG PO CAPS
50.0000 mg | ORAL_CAPSULE | Freq: Once | ORAL | Status: AC
  Administered 2019-04-07: 50 mg via ORAL

## 2019-04-07 MED ORDER — DIPHENHYDRAMINE HCL 25 MG PO CAPS
ORAL_CAPSULE | ORAL | Status: AC
  Filled 2019-04-07: qty 2

## 2019-04-07 NOTE — Patient Instructions (Signed)

## 2019-04-07 NOTE — Telephone Encounter (Signed)
Pt advised that she was not told that she had to call to set up transportation and she was trying to call [employer] phone and the number was disconnected. Upon further review I have spoke to Pt in March to advise of procedures. Pt will be late to her app today due to her not scheduling ride. However, I did re touch base with Pt and gave her the number to call to schedule rides. I have her set up today and for July 2

## 2019-04-10 ENCOUNTER — Other Ambulatory Visit: Payer: Self-pay | Admitting: Hematology and Oncology

## 2019-04-13 ENCOUNTER — Other Ambulatory Visit: Payer: Self-pay

## 2019-04-13 ENCOUNTER — Inpatient Hospital Stay: Payer: Medicare Other | Attending: Hematology and Oncology

## 2019-04-13 VITALS — BP 135/67 | HR 75 | Temp 97.9°F | Resp 18

## 2019-04-13 DIAGNOSIS — R6889 Other general symptoms and signs: Secondary | ICD-10-CM | POA: Insufficient documentation

## 2019-04-13 DIAGNOSIS — D509 Iron deficiency anemia, unspecified: Secondary | ICD-10-CM | POA: Insufficient documentation

## 2019-04-13 DIAGNOSIS — K5520 Angiodysplasia of colon without hemorrhage: Secondary | ICD-10-CM | POA: Diagnosis not present

## 2019-04-13 DIAGNOSIS — Z862 Personal history of diseases of the blood and blood-forming organs and certain disorders involving the immune mechanism: Secondary | ICD-10-CM | POA: Insufficient documentation

## 2019-04-13 MED ORDER — SODIUM CHLORIDE 0.9 % IV SOLN
510.0000 mg | Freq: Once | INTRAVENOUS | Status: AC
  Administered 2019-04-13: 510 mg via INTRAVENOUS
  Filled 2019-04-13: qty 17

## 2019-04-13 MED ORDER — ACETAMINOPHEN 325 MG PO TABS
ORAL_TABLET | ORAL | Status: AC
  Filled 2019-04-13: qty 2

## 2019-04-13 MED ORDER — SODIUM CHLORIDE 0.9 % IV SOLN
Freq: Once | INTRAVENOUS | Status: AC
  Administered 2019-04-13: 08:00:00 via INTRAVENOUS
  Filled 2019-04-13: qty 250

## 2019-04-13 MED ORDER — DIPHENHYDRAMINE HCL 25 MG PO TABS
25.0000 mg | ORAL_TABLET | Freq: Once | ORAL | Status: AC
  Administered 2019-04-13: 25 mg via ORAL
  Filled 2019-04-13: qty 1

## 2019-04-13 MED ORDER — DIPHENHYDRAMINE HCL 25 MG PO CAPS
ORAL_CAPSULE | ORAL | Status: AC
  Filled 2019-04-13: qty 1

## 2019-04-13 MED ORDER — ACETAMINOPHEN 325 MG PO TABS
650.0000 mg | ORAL_TABLET | Freq: Once | ORAL | Status: AC
  Administered 2019-04-13: 08:00:00 650 mg via ORAL

## 2019-04-13 NOTE — Patient Instructions (Signed)

## 2019-04-28 ENCOUNTER — Telehealth: Payer: Self-pay | Admitting: Internal Medicine

## 2019-04-28 NOTE — Telephone Encounter (Signed)
The pt says that someone at the infusion center asked her if she had sores in her mouth and does not know why.  She wanted to know why she would ask that.  I advised her to call the infusion center and ask that question directly to them.  She talked about her dislike of Dr Alvy Bimler.  She states that she will follow what Dr Hilarie Fredrickson says ONLY...

## 2019-04-28 NOTE — Telephone Encounter (Signed)
Pt reported that she just had an operation on her mouth and that she would like a call back to discuss low iron.

## 2019-05-08 ENCOUNTER — Telehealth: Payer: Self-pay | Admitting: *Deleted

## 2019-05-08 NOTE — Telephone Encounter (Signed)
Covid-19 screening questions   Do you now or have you had a fever in the last 14 days? NO   Do you have any respiratory symptoms of shortness of breath or cough now or in the last 14 days? NO  Do you have any family members or close contacts with diagnosed or suspected Covid-19 in the past 14 days? NO  Have you been tested for Covid-19 and found to be positive? NO        

## 2019-05-08 NOTE — Telephone Encounter (Signed)
error 

## 2019-05-09 ENCOUNTER — Encounter: Payer: Self-pay | Admitting: Internal Medicine

## 2019-05-09 ENCOUNTER — Ambulatory Visit (INDEPENDENT_AMBULATORY_CARE_PROVIDER_SITE_OTHER): Payer: Medicare Other | Admitting: Internal Medicine

## 2019-05-09 ENCOUNTER — Other Ambulatory Visit: Payer: Self-pay

## 2019-05-09 VITALS — BP 120/62 | HR 72 | Temp 98.7°F | Ht 62.0 in | Wt 189.4 lb

## 2019-05-09 DIAGNOSIS — K219 Gastro-esophageal reflux disease without esophagitis: Secondary | ICD-10-CM | POA: Diagnosis not present

## 2019-05-09 DIAGNOSIS — K5909 Other constipation: Secondary | ICD-10-CM

## 2019-05-09 DIAGNOSIS — D500 Iron deficiency anemia secondary to blood loss (chronic): Secondary | ICD-10-CM

## 2019-05-09 NOTE — Patient Instructions (Addendum)
You may remain off of pantoprazole for now.  If you are age 82 or older, your body mass index should be between 23-30. Your Body mass index is 34.64 kg/m. If this is out of the aforementioned range listed, please consider follow up with your Primary Care Provider.  If you are age 21 or younger, your body mass index should be between 19-25. Your Body mass index is 34.64 kg/m. If this is out of the aformentioned range listed, please consider follow up with your Primary Care Provider.

## 2019-05-10 NOTE — Progress Notes (Signed)
Subjective:    Patient ID: Melanie EvertsMartha Lee [name], female    DOB: 09-26-1935, 84 y.o.   MRN: [id number]  HPI Melanie [name] is an 84 year old female with a history of iron deficiency anemia, colonic angiodysplastic lesions, history of H. pylori negative duodenal ulcer, hiatal hernia and GERD here for follow-up.  Last seen 11/16/2018  Overall she is feeling well but has noticed her energy levels declining.  She had iron studies recently with hematology and her ferritin was found to be low at 8.  She has an iron infusion scheduled for later this week.  She is hoping to be able to maintain her ferritin above 50.  She is become slightly more anemic with a hemoglobin of 9.1.  No overt bleeding or melena.  She stopped her PPI because she thought it was causing upper arm soreness.  The upper arm soreness has resolved.  She denies having heartburn, dysphagia and odynophagia.  Denies nausea vomiting.  Denies abdominal pain.  Bowel movements are mostly regular as long as she remains on MiraLAX.  She is using MiraLAX more as needed and she is also drinking warm prune juice.   Review of Systems As per HPI, otherwise negative  Current Medications, Allergies, Past Medical History, Past Surgical History, Family History and Social History were reviewed in [facility] electronic medical record.     Objective:   Physical Exam BP 120/62 (BP Location: Left Arm, Patient Position: Sitting, Cuff Size: Normal)   Pulse 72   Temp 98.7 F (37.1 C)   Ht 5\' 2"  (1.575 m)   Wt 189 lb 6 oz (85.9 kg)   BMI 34.64 kg/m  Gen: awake, alert, NAD HEENT: anicteric, op clear CV: RRR, no mrg Pulm: CTA b/l Abd: soft, NT/ND, +BS throughout Ext: no c/c/e Neuro: nonfocal  CBC    Component Value Date/Time   WBC 4.8 03/30/2019 1112   RBC 3.50 (L) 03/30/2019 1112   HGB 9.1 (L) 03/30/2019 1112   HGB 13.7 07/15/2018 1144   HGB 10.3 (L) 09/08/2017 1114   HCT 30.0 (L) 03/30/2019 1112   HCT 33.0 (L) 09/08/2017 1114   PLT 397  03/30/2019 1112   PLT 247 07/15/2018 1144   PLT 393 09/08/2017 1114   MCV 85.7 03/30/2019 1112   MCV 79.2 (L) 09/08/2017 1114   MCH 26.0 03/30/2019 1112   MCHC 30.3 03/30/2019 1112   RDW 16.7 (H) 03/30/2019 1112   RDW 18.3 (H) 09/08/2017 1114   LYMPHSABS 1.0 03/30/2019 1112   LYMPHSABS 1.5 09/08/2017 1114   MONOABS 0.4 03/30/2019 1112   MONOABS 0.4 09/08/2017 1114   EOSABS 0.2 03/30/2019 1112   EOSABS 0.2 09/08/2017 1114   BASOSABS 0.0 03/30/2019 1112   BASOSABS 0.0 09/08/2017 1114   Iron/TIBC/Ferritin/ %Sat    Component Value Date/Time   IRON 14 (L) 03/30/2019 1113   IRON 116 04/09/2015 1029   TIBC 411 03/30/2019 1113   TIBC 305 04/09/2015 1029   FERRITIN 8 (L) 03/30/2019 1113   FERRITIN 7 (L) 09/08/2017 1114   IRONPCTSAT 3 (L) 03/30/2019 1113   IRONPCTSAT 38 04/09/2015 1029   IRONPCTSAT 8 (L) 12/23/2012 1101       Assessment & Plan:  84 year old female with a history of iron deficiency anemia, colonic angiodysplastic lesions, history of H. pylori negative duodenal ulcer, hiatal hernia and GERD here for follow-up.   1. IDA -- recurrent.  Likely due to slow GI GI blood loss from known colonic angiodysplasia.  No overt bleeding.  Continue  IV iron periodic and close attention to blood counts.  Goal ferritin greater than 50 appreciate hematology involvement  2.  GERD --relatively asymptomatic certainly no alarm symptoms.  Off of PPI.  Will remain off PPI for now  3.  Chronic constipation --MiraLAX 17 g daily on an as-needed basis  25 minutes spent with the patient today. Greater than 50% was spent in counseling and coordination of care with the patient

## 2019-05-11 ENCOUNTER — Inpatient Hospital Stay: Payer: Medicare Other

## 2019-05-11 ENCOUNTER — Encounter: Payer: Self-pay | Admitting: Hematology and Oncology

## 2019-05-11 ENCOUNTER — Other Ambulatory Visit: Payer: Self-pay

## 2019-05-11 ENCOUNTER — Inpatient Hospital Stay (HOSPITAL_BASED_OUTPATIENT_CLINIC_OR_DEPARTMENT_OTHER): Payer: Medicare Other | Admitting: Hematology and Oncology

## 2019-05-11 DIAGNOSIS — R6889 Other general symptoms and signs: Secondary | ICD-10-CM | POA: Diagnosis not present

## 2019-05-11 DIAGNOSIS — D500 Iron deficiency anemia secondary to blood loss (chronic): Secondary | ICD-10-CM

## 2019-05-11 DIAGNOSIS — K5520 Angiodysplasia of colon without hemorrhage: Secondary | ICD-10-CM

## 2019-05-11 DIAGNOSIS — D509 Iron deficiency anemia, unspecified: Secondary | ICD-10-CM | POA: Diagnosis not present

## 2019-05-11 DIAGNOSIS — Z862 Personal history of diseases of the blood and blood-forming organs and certain disorders involving the immune mechanism: Secondary | ICD-10-CM

## 2019-05-11 LAB — CBC WITH DIFFERENTIAL/PLATELET
Abs Immature Granulocytes: 0.02 10*3/uL (ref 0.00–0.07)
Basophils Absolute: 0 10*3/uL (ref 0.0–0.1)
Basophils Relative: 1 %
Eosinophils Absolute: 0.3 10*3/uL (ref 0.0–0.5)
Eosinophils Relative: 5 %
HCT: 41.9 % (ref 36.0–46.0)
Hemoglobin: 13.3 g/dL (ref 12.0–15.0)
Immature Granulocytes: 0 %
Lymphocytes Relative: 19 %
Lymphs Abs: 1.1 10*3/uL (ref 0.7–4.0)
MCH: 27.7 pg (ref 26.0–34.0)
MCHC: 31.7 g/dL (ref 30.0–36.0)
MCV: 87.3 fL (ref 80.0–100.0)
Monocytes Absolute: 0.4 10*3/uL (ref 0.1–1.0)
Monocytes Relative: 8 %
Neutro Abs: 3.8 10*3/uL (ref 1.7–7.7)
Neutrophils Relative %: 67 %
Platelets: 290 10*3/uL (ref 150–400)
RBC: 4.8 MIL/uL (ref 3.87–5.11)
RDW: 19.8 % — ABNORMAL HIGH (ref 11.5–15.5)
WBC: 5.7 10*3/uL (ref 4.0–10.5)
nRBC: 0 % (ref 0.0–0.2)

## 2019-05-11 LAB — IRON AND TIBC
Iron: 74 ug/dL (ref 41–142)
Saturation Ratios: 23 % (ref 21–57)
TIBC: 324 ug/dL (ref 236–444)
UIBC: 250 ug/dL (ref 120–384)

## 2019-05-11 LAB — FERRITIN: Ferritin: 85 ng/mL (ref 11–307)

## 2019-05-11 NOTE — Assessment & Plan Note (Signed)
She ruminates over inadequate care she had received over the last 30 years and how her husband and family were treated incorrectly by multiple doctors over the last 30 years Unfortunately, I cannot help with her problems The patient appears to be agitated because of discomfort from recent dental extraction I will defer to her dentist for further management

## 2019-05-11 NOTE — Assessment & Plan Note (Signed)
She is not anemic.  At the time of discussion, iron studies were pending At the time of dictation, her iron studies came back within normal limits The patient is adamant that she should be given IV iron because she does not "feel well" I have extensive discussion with the patient about the current guidelines Due to normal iron studies and she is not anemic, it is unlikely that her insurance will approve IV iron Intravenous iron infusion is not without risk. Ultimately, the patient reluctantly left the center annoyed that she did not receive IV iron because she felt she should be given intravenous iron based on how she feels Currently, she is scheduled to come back every 2 months for blood count monitoring. However, if she feels anemic with symptomatic fatigue again, she can call us to get her labs scheduled sooner

## 2019-05-11 NOTE — Progress Notes (Signed)
[facility] OFFICE PROGRESS NOTE  Melanie [name], Yun, MD  ASSESSMENT & PLAN:  Iron deficiency anemia She is not anemic.  At the time of discussion, iron studies were pending At the time of dictation, her iron studies came back within normal limits The patient is adamant that she should be given IV iron because she does not "feel well" I have extensive discussion with the patient about the current guidelines Due to normal iron studies and she is not anemic, it is unlikely that her insurance will approve IV iron Intravenous iron infusion is not without risk. Ultimately, the patient reluctantly left the center annoyed that she did not receive IV iron because she felt she should be given intravenous iron based on how she feels Currently, she is scheduled to come back every 2 months for blood count monitoring. However, if she feels anemic with symptomatic fatigue again, she can call us to get her labs scheduled sooner  Multiple complaints She ruminates over inadequate care she had received over the last 30 years and how her husband and family were treated incorrectly by multiple doctors over the last 30 years Unfortunately, I cannot help with her problems The patient appears to be agitated because of discomfort from recent dental extraction I will defer to her dentist for further management   No orders of the defined types were placed in this encounter.   INTERVAL HISTORY: Melanie EvertsMartha Lee [name] 84 y.o. female returns for further follow-up for recurrent iron deficiency anemia She does not feel well due to recent dental extraction She felt excessively fatigued and unwell and felt that she needed iron infusion At the time of discussion, her hemoglobin came back within normal limits Iron studies were still pending I reviewed the guidelines and plan of care with the patient It is unlikely that her insurance will approve intravenous iron infusion based on how she feels The patient started to  become somewhat aggressive and ruminates about inadequate care she and her family had received over the last 30 years She felt that this clinic has been over charging the cost of infusion She has not been billed incorrectly in the last few years I shared with her that if she insisted of getting intravenous iron infusion with normal blood test, insurance company may not reimburse the cost of the drug and she might be stuck with the bill The patient become increasingly frustrated at the end of the visit but is willing to wait for results to come back before intravenous iron is given So far, she denies infusion reaction The patient denies any recent signs or symptoms of bleeding such as spontaneous epistaxis, hematuria or hematochezia.  SUMMARY OF HEMATOLOGIC HISTORY:  The patient has been complaining of fatigue and was found to have severe iron deficiency anemia. Approximately a year or 2 ago, she received 2 units of blood transfusion because of this. She had extensive GI evaluation with EGD and colonoscopy and was never found to have any sort of GI bleed. She has received 4 intravenous doses of iron infusion and the last infusion in 3 months ago. Bone marrow aspirate and biopsy in January 2013 showed no evidence of malignancy or persistent absence of iron stores. In 2014, she received 6 doses of intravenous iron infusion. She was referred to gastroenterology for further evaluation due to suspected GI bleed. Repeat GI endoscopy dated 12/26/2013 showed angiodysplastic lesion and moderate diverticulosis. The patient had received numerous intravenous iron infusion almost on a monthly basis from 03/15/2013 to  01/17/15. Her last intravenous iron was in July 2016, and then resumed in 2018  I have reviewed the past medical history, past surgical history, social history and family history with the patient and they are unchanged from previous note.  ALLERGIES:  is allergic to cephalexin; clindamycin/lincomycin;  codeine; levofloxacin; morphine and related; other; quinolones; sulfa antibiotics; tramadol; amoxicillin; fexofenadine hcl; floxin [ocuflox]; neurontin [gabapentin]; and septra [bactrim].  MEDICATIONS:  Current Outpatient Medications  Medication Sig Dispense Refill  . aspirin 81 MG tablet Take 81 mg by mouth daily.     . cholecalciferol (VITAMIN D) 1000 units tablet Take 5,000 Units by mouth daily.    . Cyanocobalamin (VITAMIN B12 PO) Take 500 mcg by mouth daily.     . folic acid (FOLVITE) 1 MG tablet TAKE 1 TABLET BY MOUTH EVERY DAY 90 tablet 3  . magnesium oxide (MAG-OX) 400 MG tablet Take 400 mg by mouth daily.    . metoprolol (LOPRESSOR) 50 MG tablet Take 1 tablet (50 mg total) by mouth 2 (two) times daily. 180 tablet 1  . polyethylene glycol powder (GLYCOLAX/MIRALAX) powder Take 17 g by mouth daily as needed. 1051 g 1  . UNABLE TO FIND Med Name:  [facility] Lidocaine Cream over the counter as needed for joint and muscle pain     No current facility-administered medications for this visit.      REVIEW OF SYSTEMS:   Constitutional: Denies fevers, chills or night sweats Eyes: Denies blurriness of vision Ears, nose, mouth, throat, and face: Denies mucositis or sore throat Respiratory: Denies cough, dyspnea or wheezes Cardiovascular: Denies palpitation, chest discomfort or lower extremity swelling Gastrointestinal:  Denies nausea, heartburn or change in bowel habits Skin: Denies abnormal skin rashes Lymphatics: Denies new lymphadenopathy or easy bruising Neurological:Denies numbness, tingling or new weaknesses Behavioral/Psych: Mood is stable, no new changes  All other systems were reviewed with the patient and are negative.  PHYSICAL EXAMINATION: ECOG PERFORMANCE STATUS: 1 - Symptomatic but completely ambulatory  Vitals:   05/11/19 1135  BP: 133/65  Pulse: 65  Resp: 18  Temp: 98.7 F (37.1 C)  SpO2: 97%   Filed Weights   05/11/19 1135  Weight: 190 lb (86.2 kg)     GENERAL:alert, no distress and comfortable SKIN: skin color, texture, turgor are normal, no rashes or significant lesions EYES: normal, Conjunctiva are pink and non-injected, sclera clear OROPHARYNX:no exudate, no erythema and lips, buccal mucosa, and tongue normal  NECK: supple, thyroid normal size, non-tender, without nodularity LYMPH:  no palpable lymphadenopathy in the cervical, axillary or inguinal LUNGS: clear to auscultation and percussion with normal breathing effort HEART: regular rate & rhythm and no murmurs and no lower extremity edema ABDOMEN:abdomen soft, non-tender and normal bowel sounds Musculoskeletal:no cyanosis of digits and no clubbing  NEURO: alert & oriented x 3 with fluent speech, no focal motor/sensory deficits  LABORATORY DATA:  I have reviewed the data as listed     Component Value Date/Time   NA 140 11/02/2016 1403   NA 144 02/12/2014 0849   K 4.3 11/02/2016 1403   K 3.6 02/12/2014 0849   CL 106 11/02/2016 1403   CL 109 (H) 12/23/2012 1100   CO2 28 11/02/2016 1403   CO2 24 02/12/2014 0849   GLUCOSE 99 11/02/2016 1403   GLUCOSE 152 (H) 02/12/2014 0849   GLUCOSE 106 (H) 12/23/2012 1100   BUN 18 11/02/2016 1403   BUN 15.4 02/12/2014 0849   CREATININE 0.75 11/02/2016 1403   CREATININE 0.8 02/12/2014  0849   CALCIUM 10.6 (H) 11/02/2016 1403   CALCIUM 10.7 (H) 02/12/2014 0849   PROT 7.1 08/22/2015 1041   PROT 6.3 (L) 02/12/2014 0849   ALBUMIN 4.1 08/22/2015 1041   ALBUMIN 3.4 (L) 02/12/2014 0849   AST 17 08/22/2015 1041   AST 13 02/12/2014 0849   ALT 16 08/22/2015 1041   ALT 8 02/12/2014 0849   ALKPHOS 89 08/22/2015 1041   ALKPHOS 78 02/12/2014 0849   BILITOT 0.5 08/22/2015 1041   BILITOT 0.25 02/12/2014 0849   GFRNONAA 80 (L) 08/22/2011 1205   GFRAA >90 08/22/2011 1205    No results found for: SPEP, UPEP  Lab Results  Component Value Date   WBC 5.7 05/11/2019   NEUTROABS 3.8 05/11/2019   HGB 13.3 05/11/2019   HCT 41.9 05/11/2019    MCV 87.3 05/11/2019   PLT 290 05/11/2019      Chemistry      Component Value Date/Time   NA 140 11/02/2016 1403   NA 144 02/12/2014 0849   K 4.3 11/02/2016 1403   K 3.6 02/12/2014 0849   CL 106 11/02/2016 1403   CL 109 (H) 12/23/2012 1100   CO2 28 11/02/2016 1403   CO2 24 02/12/2014 0849   BUN 18 11/02/2016 1403   BUN 15.4 02/12/2014 0849   CREATININE 0.75 11/02/2016 1403   CREATININE 0.8 02/12/2014 0849      Component Value Date/Time   CALCIUM 10.6 (H) 11/02/2016 1403   CALCIUM 10.7 (H) 02/12/2014 0849   ALKPHOS 89 08/22/2015 1041   ALKPHOS 78 02/12/2014 0849   AST 17 08/22/2015 1041   AST 13 02/12/2014 0849   ALT 16 08/22/2015 1041   ALT 8 02/12/2014 0849   BILITOT 0.5 08/22/2015 1041   BILITOT 0.25 02/12/2014 0849       I spent 10 minutes counseling the patient face to face. The total time spent in the appointment was 15 minutes and more than 50% was on counseling.   All questions were answered. The patient knows to call the clinic with any problems, questions or concerns. No barriers to learning was detected.    Artis DelayNi Lindsay Soulliere, MD 7/30/20202:12 PM

## 2019-05-25 ENCOUNTER — Other Ambulatory Visit: Payer: Self-pay

## 2019-05-25 ENCOUNTER — Inpatient Hospital Stay: Payer: Medicare Other | Attending: Hematology and Oncology

## 2019-05-25 ENCOUNTER — Telehealth: Payer: Self-pay

## 2019-05-25 DIAGNOSIS — K5520 Angiodysplasia of colon without hemorrhage: Secondary | ICD-10-CM

## 2019-05-25 DIAGNOSIS — D500 Iron deficiency anemia secondary to blood loss (chronic): Secondary | ICD-10-CM

## 2019-05-25 DIAGNOSIS — Z862 Personal history of diseases of the blood and blood-forming organs and certain disorders involving the immune mechanism: Secondary | ICD-10-CM | POA: Diagnosis not present

## 2019-05-25 DIAGNOSIS — R6889 Other general symptoms and signs: Secondary | ICD-10-CM | POA: Insufficient documentation

## 2019-05-25 LAB — CBC WITH DIFFERENTIAL/PLATELET
Abs Immature Granulocytes: 0.01 10*3/uL (ref 0.00–0.07)
Basophils Absolute: 0 10*3/uL (ref 0.0–0.1)
Basophils Relative: 1 %
Eosinophils Absolute: 0.3 10*3/uL (ref 0.0–0.5)
Eosinophils Relative: 4 %
HCT: 41.8 % (ref 36.0–46.0)
Hemoglobin: 13.6 g/dL (ref 12.0–15.0)
Immature Granulocytes: 0 %
Lymphocytes Relative: 20 %
Lymphs Abs: 1.3 10*3/uL (ref 0.7–4.0)
MCH: 27.7 pg (ref 26.0–34.0)
MCHC: 32.5 g/dL (ref 30.0–36.0)
MCV: 85.1 fL (ref 80.0–100.0)
Monocytes Absolute: 0.5 10*3/uL (ref 0.1–1.0)
Monocytes Relative: 8 %
Neutro Abs: 4.2 10*3/uL (ref 1.7–7.7)
Neutrophils Relative %: 67 %
Platelets: 278 10*3/uL (ref 150–400)
RBC: 4.91 MIL/uL (ref 3.87–5.11)
RDW: 18.8 % — ABNORMAL HIGH (ref 11.5–15.5)
WBC: 6.3 10*3/uL (ref 4.0–10.5)
nRBC: 0 % (ref 0.0–0.2)

## 2019-05-25 LAB — IRON AND TIBC
Iron: 78 ug/dL (ref 41–142)
Saturation Ratios: 25 % (ref 21–57)
TIBC: 315 ug/dL (ref 236–444)
UIBC: 237 ug/dL (ref 120–384)

## 2019-05-25 LAB — FERRITIN: Ferritin: 46 ng/mL (ref 11–307)

## 2019-05-25 NOTE — Telephone Encounter (Signed)
Pt scheduled for labs and IV iron on 06/29/19 per Dr Alvy Bimler. Pt is aware of date, time.

## 2019-06-05 ENCOUNTER — Other Ambulatory Visit: Payer: Self-pay | Admitting: Internal Medicine

## 2019-06-05 DIAGNOSIS — Z1231 Encounter for screening mammogram for malignant neoplasm of breast: Secondary | ICD-10-CM

## 2019-06-29 ENCOUNTER — Telehealth: Payer: Self-pay

## 2019-06-29 ENCOUNTER — Telehealth: Payer: Self-pay | Admitting: Hematology and Oncology

## 2019-06-29 ENCOUNTER — Inpatient Hospital Stay: Payer: Medicare Other

## 2019-06-29 ENCOUNTER — Other Ambulatory Visit: Payer: Self-pay

## 2019-06-29 ENCOUNTER — Inpatient Hospital Stay: Payer: Medicare Other | Attending: Hematology and Oncology

## 2019-06-29 VITALS — BP 133/75 | HR 67 | Temp 98.6°F | Resp 20

## 2019-06-29 DIAGNOSIS — K5520 Angiodysplasia of colon without hemorrhage: Secondary | ICD-10-CM | POA: Insufficient documentation

## 2019-06-29 DIAGNOSIS — D509 Iron deficiency anemia, unspecified: Secondary | ICD-10-CM | POA: Insufficient documentation

## 2019-06-29 DIAGNOSIS — D500 Iron deficiency anemia secondary to blood loss (chronic): Secondary | ICD-10-CM

## 2019-06-29 LAB — CBC WITH DIFFERENTIAL/PLATELET
Abs Immature Granulocytes: 0.01 10*3/uL (ref 0.00–0.07)
Basophils Absolute: 0 10*3/uL (ref 0.0–0.1)
Basophils Relative: 0 %
Eosinophils Absolute: 0.2 10*3/uL (ref 0.0–0.5)
Eosinophils Relative: 4 %
HCT: 38 % (ref 36.0–46.0)
Hemoglobin: 12 g/dL (ref 12.0–15.0)
Immature Granulocytes: 0 %
Lymphocytes Relative: 22 %
Lymphs Abs: 1.1 10*3/uL (ref 0.7–4.0)
MCH: 27.3 pg (ref 26.0–34.0)
MCHC: 31.6 g/dL (ref 30.0–36.0)
MCV: 86.4 fL (ref 80.0–100.0)
Monocytes Absolute: 0.4 10*3/uL (ref 0.1–1.0)
Monocytes Relative: 7 %
Neutro Abs: 3.5 10*3/uL (ref 1.7–7.7)
Neutrophils Relative %: 67 %
Platelets: 334 10*3/uL (ref 150–400)
RBC: 4.4 MIL/uL (ref 3.87–5.11)
RDW: 16.2 % — ABNORMAL HIGH (ref 11.5–15.5)
WBC: 5.2 10*3/uL (ref 4.0–10.5)
nRBC: 0 % (ref 0.0–0.2)

## 2019-06-29 LAB — IRON AND TIBC
Iron: 28 ug/dL — ABNORMAL LOW (ref 41–142)
Saturation Ratios: 7 % — ABNORMAL LOW (ref 21–57)
TIBC: 389 ug/dL (ref 236–444)
UIBC: 361 ug/dL (ref 120–384)

## 2019-06-29 LAB — FERRITIN: Ferritin: 12 ng/mL (ref 11–307)

## 2019-06-29 MED ORDER — ACETAMINOPHEN 325 MG PO TABS
650.0000 mg | ORAL_TABLET | Freq: Once | ORAL | Status: AC
  Administered 2019-06-29: 650 mg via ORAL

## 2019-06-29 MED ORDER — SODIUM CHLORIDE 0.9 % IV SOLN
510.0000 mg | Freq: Once | INTRAVENOUS | Status: AC
  Administered 2019-06-29: 510 mg via INTRAVENOUS
  Filled 2019-06-29: qty 17

## 2019-06-29 MED ORDER — SODIUM CHLORIDE 0.9 % IV SOLN
INTRAVENOUS | Status: DC
  Administered 2019-06-29: 15:00:00 via INTRAVENOUS
  Filled 2019-06-29: qty 250

## 2019-06-29 MED ORDER — DIPHENHYDRAMINE HCL 25 MG PO CAPS
ORAL_CAPSULE | ORAL | Status: AC
  Filled 2019-06-29: qty 1

## 2019-06-29 MED ORDER — DIPHENHYDRAMINE HCL 25 MG PO TABS
25.0000 mg | ORAL_TABLET | Freq: Once | ORAL | Status: AC
  Administered 2019-06-29: 25 mg via ORAL
  Filled 2019-06-29: qty 1

## 2019-06-29 MED ORDER — ACETAMINOPHEN 325 MG PO TABS
ORAL_TABLET | ORAL | Status: AC
  Filled 2019-06-29: qty 2

## 2019-06-29 NOTE — Patient Instructions (Signed)

## 2019-06-29 NOTE — Telephone Encounter (Signed)
Rescheduled patient's missed appointments and checked her in for labs.

## 2019-06-29 NOTE — Telephone Encounter (Signed)
Patient missed her lab and iron today. Attempted to call her at the only # provided and NO answer and No VM capability.

## 2019-07-19 ENCOUNTER — Other Ambulatory Visit: Payer: Self-pay | Admitting: Hematology and Oncology

## 2019-07-19 DIAGNOSIS — D500 Iron deficiency anemia secondary to blood loss (chronic): Secondary | ICD-10-CM

## 2019-07-20 ENCOUNTER — Telehealth: Payer: Self-pay | Admitting: Internal Medicine

## 2019-07-20 NOTE — Telephone Encounter (Signed)
Dr Hilarie Fredrickson, can you help here? Unless Im just missing it, I dont see any dermatologist suggestions in her chart.Marland KitchenMarland Kitchen

## 2019-07-20 NOTE — Telephone Encounter (Signed)
Patient advised of this information and given phone number for [facility].

## 2019-07-20 NOTE — Telephone Encounter (Signed)
[facility]

## 2019-07-27 ENCOUNTER — Other Ambulatory Visit: Payer: Self-pay

## 2019-07-27 ENCOUNTER — Telehealth: Payer: Self-pay

## 2019-07-27 ENCOUNTER — Other Ambulatory Visit: Payer: Self-pay | Admitting: Hematology and Oncology

## 2019-07-27 ENCOUNTER — Inpatient Hospital Stay: Payer: Medicare Other | Attending: Hematology and Oncology

## 2019-07-27 DIAGNOSIS — D509 Iron deficiency anemia, unspecified: Secondary | ICD-10-CM | POA: Insufficient documentation

## 2019-07-27 DIAGNOSIS — R6889 Other general symptoms and signs: Secondary | ICD-10-CM | POA: Insufficient documentation

## 2019-07-27 DIAGNOSIS — D500 Iron deficiency anemia secondary to blood loss (chronic): Secondary | ICD-10-CM

## 2019-07-27 LAB — FERRITIN: Ferritin: 66 ng/mL (ref 11–307)

## 2019-07-27 LAB — CBC WITH DIFFERENTIAL/PLATELET
Abs Immature Granulocytes: 0.01 10*3/uL (ref 0.00–0.07)
Basophils Absolute: 0 10*3/uL (ref 0.0–0.1)
Basophils Relative: 1 %
Eosinophils Absolute: 0.3 10*3/uL (ref 0.0–0.5)
Eosinophils Relative: 6 %
HCT: 42.6 % (ref 36.0–46.0)
Hemoglobin: 13.7 g/dL (ref 12.0–15.0)
Immature Granulocytes: 0 %
Lymphocytes Relative: 23 %
Lymphs Abs: 1.3 10*3/uL (ref 0.7–4.0)
MCH: 29 pg (ref 26.0–34.0)
MCHC: 32.2 g/dL (ref 30.0–36.0)
MCV: 90.3 fL (ref 80.0–100.0)
Monocytes Absolute: 0.4 10*3/uL (ref 0.1–1.0)
Monocytes Relative: 8 %
Neutro Abs: 3.4 10*3/uL (ref 1.7–7.7)
Neutrophils Relative %: 62 %
Platelets: 263 10*3/uL (ref 150–400)
RBC: 4.72 MIL/uL (ref 3.87–5.11)
RDW: 14.3 % (ref 11.5–15.5)
WBC: 5.4 10*3/uL (ref 4.0–10.5)
nRBC: 0 % (ref 0.0–0.2)

## 2019-07-27 NOTE — Telephone Encounter (Signed)
-----   Message from Heath Lark, MD sent at 07/27/2019 11:24 AM EDT ----- Regarding: watch out for ferritin to come back and then call her. She does not need IV iron for now

## 2019-07-27 NOTE — Telephone Encounter (Signed)
Called and given below message. She verbalized understanding. She asked for lab results to mailed to her. Placed lab results in mail.

## 2019-09-28 ENCOUNTER — Telehealth: Payer: Self-pay

## 2019-09-28 ENCOUNTER — Other Ambulatory Visit: Payer: Self-pay

## 2019-09-28 ENCOUNTER — Other Ambulatory Visit: Payer: Self-pay | Admitting: Hematology and Oncology

## 2019-09-28 ENCOUNTER — Inpatient Hospital Stay: Payer: Medicare Other | Attending: Hematology and Oncology

## 2019-09-28 DIAGNOSIS — D500 Iron deficiency anemia secondary to blood loss (chronic): Secondary | ICD-10-CM

## 2019-09-28 DIAGNOSIS — D509 Iron deficiency anemia, unspecified: Secondary | ICD-10-CM | POA: Diagnosis not present

## 2019-09-28 DIAGNOSIS — K5520 Angiodysplasia of colon without hemorrhage: Secondary | ICD-10-CM

## 2019-09-28 LAB — CBC WITH DIFFERENTIAL/PLATELET
Abs Immature Granulocytes: 0.01 10*3/uL (ref 0.00–0.07)
Basophils Absolute: 0 10*3/uL (ref 0.0–0.1)
Basophils Relative: 1 %
Eosinophils Absolute: 0.3 10*3/uL (ref 0.0–0.5)
Eosinophils Relative: 5 %
HCT: 39.7 % (ref 36.0–46.0)
Hemoglobin: 13.1 g/dL (ref 12.0–15.0)
Immature Granulocytes: 0 %
Lymphocytes Relative: 25 %
Lymphs Abs: 1.4 10*3/uL (ref 0.7–4.0)
MCH: 28.8 pg (ref 26.0–34.0)
MCHC: 33 g/dL (ref 30.0–36.0)
MCV: 87.3 fL (ref 80.0–100.0)
Monocytes Absolute: 0.5 10*3/uL (ref 0.1–1.0)
Monocytes Relative: 10 %
Neutro Abs: 3.3 10*3/uL (ref 1.7–7.7)
Neutrophils Relative %: 59 %
Platelets: 288 10*3/uL (ref 150–400)
RBC: 4.55 MIL/uL (ref 3.87–5.11)
RDW: 13.7 % (ref 11.5–15.5)
WBC: 5.4 10*3/uL (ref 4.0–10.5)
nRBC: 0 % (ref 0.0–0.2)

## 2019-09-28 LAB — IRON AND TIBC
Iron: 35 ug/dL — ABNORMAL LOW (ref 41–142)
Saturation Ratios: 9 % — ABNORMAL LOW (ref 21–57)
TIBC: 379 ug/dL (ref 236–444)
UIBC: 344 ug/dL (ref 120–384)

## 2019-09-28 LAB — FERRITIN: Ferritin: 11 ng/mL (ref 11–307)

## 2019-09-28 NOTE — Telephone Encounter (Signed)
-----   Message from Heath Lark, MD sent at 09/28/2019 12:40 PM EST ----- Regarding: labs from today Pls call her and let her know she is not anemic but iron studies are running low Due to the holiday schedule, if she agrees, I can schedule lab, see me and IV iron on 1/8 Let me know and I will put in scheduling msg

## 2019-09-28 NOTE — Telephone Encounter (Signed)
Called and given below message. She verbalized understanding. She is agreeable to 1/8 appt.

## 2019-10-03 ENCOUNTER — Telehealth: Payer: Self-pay | Admitting: Hematology and Oncology

## 2019-10-03 NOTE — Telephone Encounter (Signed)
Scheduled appt per 12/17 schmessage - pt is aware of appt date and time   

## 2019-10-08 ENCOUNTER — Other Ambulatory Visit: Payer: Self-pay | Admitting: Hematology and Oncology

## 2019-10-09 ENCOUNTER — Telehealth: Payer: Self-pay

## 2019-10-09 NOTE — Telephone Encounter (Signed)
She called and left a message to call her.   Called back. Requesting a copy of recent labs from 12/17 mailed to her. Verified address and mailed out a copy of labs.

## 2019-10-20 ENCOUNTER — Inpatient Hospital Stay: Payer: Medicare Other

## 2019-10-20 ENCOUNTER — Inpatient Hospital Stay: Payer: Medicare Other | Admitting: Hematology and Oncology

## 2019-10-27 ENCOUNTER — Inpatient Hospital Stay: Payer: Medicare Other | Attending: Hematology and Oncology

## 2019-10-27 ENCOUNTER — Encounter: Payer: Self-pay | Admitting: Hematology and Oncology

## 2019-10-27 ENCOUNTER — Inpatient Hospital Stay: Payer: Medicare Other

## 2019-10-27 ENCOUNTER — Other Ambulatory Visit: Payer: Self-pay

## 2019-10-27 ENCOUNTER — Inpatient Hospital Stay (HOSPITAL_BASED_OUTPATIENT_CLINIC_OR_DEPARTMENT_OTHER): Payer: Medicare Other | Admitting: Hematology and Oncology

## 2019-10-27 VITALS — BP 122/69 | HR 68 | Temp 97.6°F | Resp 17

## 2019-10-27 DIAGNOSIS — R6889 Other general symptoms and signs: Secondary | ICD-10-CM | POA: Diagnosis not present

## 2019-10-27 DIAGNOSIS — D509 Iron deficiency anemia, unspecified: Secondary | ICD-10-CM | POA: Diagnosis present

## 2019-10-27 DIAGNOSIS — K5520 Angiodysplasia of colon without hemorrhage: Secondary | ICD-10-CM | POA: Diagnosis not present

## 2019-10-27 DIAGNOSIS — D500 Iron deficiency anemia secondary to blood loss (chronic): Secondary | ICD-10-CM

## 2019-10-27 LAB — FERRITIN: Ferritin: 7 ng/mL — ABNORMAL LOW (ref 11–307)

## 2019-10-27 LAB — CBC WITH DIFFERENTIAL/PLATELET
Abs Immature Granulocytes: 0.01 10*3/uL (ref 0.00–0.07)
Basophils Absolute: 0 10*3/uL (ref 0.0–0.1)
Basophils Relative: 1 %
Eosinophils Absolute: 0.3 10*3/uL (ref 0.0–0.5)
Eosinophils Relative: 5 %
HCT: 36.9 % (ref 36.0–46.0)
Hemoglobin: 12.1 g/dL (ref 12.0–15.0)
Immature Granulocytes: 0 %
Lymphocytes Relative: 28 %
Lymphs Abs: 1.7 10*3/uL (ref 0.7–4.0)
MCH: 28.1 pg (ref 26.0–34.0)
MCHC: 32.8 g/dL (ref 30.0–36.0)
MCV: 85.6 fL (ref 80.0–100.0)
Monocytes Absolute: 0.5 10*3/uL (ref 0.1–1.0)
Monocytes Relative: 8 %
Neutro Abs: 3.5 10*3/uL (ref 1.7–7.7)
Neutrophils Relative %: 58 %
Platelets: 328 10*3/uL (ref 150–400)
RBC: 4.31 MIL/uL (ref 3.87–5.11)
RDW: 13.3 % (ref 11.5–15.5)
WBC: 6 10*3/uL (ref 4.0–10.5)
nRBC: 0 % (ref 0.0–0.2)

## 2019-10-27 LAB — IRON AND TIBC
Iron: 24 ug/dL — ABNORMAL LOW (ref 41–142)
Saturation Ratios: 6 % — ABNORMAL LOW (ref 21–57)
TIBC: 413 ug/dL (ref 236–444)
UIBC: 389 ug/dL — ABNORMAL HIGH (ref 120–384)

## 2019-10-27 MED ORDER — SODIUM CHLORIDE 0.9 % IV SOLN
Freq: Once | INTRAVENOUS | Status: AC
  Filled 2019-10-27: qty 250

## 2019-10-27 MED ORDER — ACETAMINOPHEN 325 MG PO TABS
650.0000 mg | ORAL_TABLET | Freq: Once | ORAL | Status: AC
  Administered 2019-10-27: 12:00:00 650 mg via ORAL

## 2019-10-27 MED ORDER — DIPHENHYDRAMINE HCL 25 MG PO CAPS
ORAL_CAPSULE | ORAL | Status: AC
  Filled 2019-10-27: qty 1

## 2019-10-27 MED ORDER — ACETAMINOPHEN 325 MG PO TABS
ORAL_TABLET | ORAL | Status: AC
  Filled 2019-10-27: qty 2

## 2019-10-27 MED ORDER — DIPHENHYDRAMINE HCL 25 MG PO TABS
25.0000 mg | ORAL_TABLET | Freq: Once | ORAL | Status: AC
  Administered 2019-10-27: 25 mg via ORAL
  Filled 2019-10-27: qty 1

## 2019-10-27 MED ORDER — SODIUM CHLORIDE 0.9 % IV SOLN
510.0000 mg | Freq: Once | INTRAVENOUS | Status: AC
  Administered 2019-10-27: 510 mg via INTRAVENOUS
  Filled 2019-10-27: qty 17

## 2019-10-27 NOTE — Assessment & Plan Note (Signed)
The patient has significant issues related to social distancing and negative experience with certain nursing staff taking care of her I try to accommodate her needs and gave special instruction to nursing staff to take care of her

## 2019-10-27 NOTE — Assessment & Plan Note (Signed)
She has history of iron deficiency anemia and angiodysplasia of the colon. She also has possible malabsorption syndrome. She had extensive investigations by gastroenterologist. We will continue close observation

## 2019-10-27 NOTE — Patient Instructions (Signed)

## 2019-10-27 NOTE — Progress Notes (Signed)
[facility] OFFICE PROGRESS NOTE  Melanie Real, MD  ASSESSMENT & PLAN:  Iron deficiency anemia She is not anemic but she is getting worsening iron deficient She is symptomatic with leg cramps The patient is known to have angiodysplasia of the colon that predispose her to chronic bleeding I recommend we proceed with intravenous iron infusion today and next week I plan to recheck her iron studies again in 3 months It appears that she needs at least 4 iron infusion per year to avoid getting complications from severe iron deficiency anemia  The most likely cause of her anemia is due to chronic blood loss/malabsorption syndrome. We discussed some of the risks, benefits, and alternatives of intravenous iron infusions. The patient is symptomatic from anemia and the iron level is critically low. She tolerated oral iron supplement poorly and desires to achieved higher levels of iron faster for adequate hematopoesis. Some of the side-effects to be expected including risks of infusion reactions, phlebitis, headaches, nausea and fatigue.  The patient is willing to proceed. Patient education material was dispensed.  Goal is to keep ferritin level greater than 50 and resolution of anemia   Angiodysplasia of colon She has history of iron deficiency anemia and angiodysplasia of the colon. She also has possible malabsorption syndrome. She had extensive investigations by gastroenterologist. We will continue close observation  Multiple complaints The patient has significant issues related to social distancing and negative experience with certain nursing staff taking care of her I try to accommodate her needs and gave special instruction to nursing staff to take care of her   No orders of the defined types were placed in this encounter.   The total time spent in the appointment was 20 minutes encounter with patients including review of chart and various tests results, discussions about plan of  care and coordination of care plan   All questions were answered. The patient knows to call the clinic with any problems, questions or concerns. No barriers to learning was detected.    [name], MD 1/15/20211:34 PM  INTERVAL HISTORY: Melanie [name] 85 y.o. female returns for further evaluation and treatment for recurrent iron deficiency anemia She returns today for intravenous iron infusion The patient was upset that her treatment was canceled last week due to lack of transportation She shared with me a recent taxi driver who is not careful about social distancing She shared with me that a few months back, her nursing staff was rude to her She was also upset that 1 time, a nursing student attempted to put IV in her arm and felt that she was being experimented on She complained of feeling cold at times and continues to have symptoms of restless leg The patient denies any recent signs or symptoms of bleeding such as spontaneous epistaxis, hematuria or hematochezia. She denies recent infusion reaction  SUMMARY OF HEMATOLOGIC HISTORY:  The patient has been complaining of fatigue and was found to have severe iron deficiency anemia. Approximately a year or 2 ago, she received 2 units of blood transfusion because of this. She had extensive GI evaluation with EGD and colonoscopy and was never found to have any sort of GI bleed. She has received 4 intravenous doses of iron infusion and the last infusion in 3 months ago. Bone marrow aspirate and biopsy in January 2013 showed no evidence of malignancy or persistent absence of iron stores. In 2014, she received 6 doses of intravenous iron infusion. She was referred to gastroenterology for further  evaluation due to suspected GI bleed. Repeat GI endoscopy dated 12/26/2013 showed angiodysplastic lesion and moderate diverticulosis. The patient had received numerous intravenous iron infusion almost on a monthly basis from 03/15/2013 to 01/17/15. Her last  intravenous iron was in July 2016, and then resumed in 2018  I have reviewed the past medical history, past surgical history, social history and family history with the patient and they are unchanged from previous note.  ALLERGIES:  is allergic to cephalexin; clindamycin/lincomycin; codeine; levofloxacin; morphine and related; other; quinolones; sulfa antibiotics; tramadol; amoxicillin; fexofenadine hcl; floxin [ocuflox]; neurontin [gabapentin]; and septra [bactrim].  MEDICATIONS:  Current Outpatient Medications  Medication Sig Dispense Refill  . aspirin 81 MG tablet Take 81 mg by mouth daily.     . cholecalciferol (VITAMIN D) 1000 units tablet Take 5,000 Units by mouth daily.    . Cyanocobalamin (VITAMIN B12 PO) Take 500 mcg by mouth daily.     . folic acid (FOLVITE) 1 MG tablet TAKE 1 TABLET BY MOUTH EVERY DAY 90 tablet 3  . magnesium oxide (MAG-OX) 400 MG tablet Take 400 mg by mouth daily.    . metoprolol (LOPRESSOR) 50 MG tablet Take 1 tablet (50 mg total) by mouth 2 (two) times daily. 180 tablet 1  . polyethylene glycol powder (GLYCOLAX/MIRALAX) powder Take 17 g by mouth daily as needed. 1051 g 1  . UNABLE TO FIND Med Name:  [facility] Lidocaine Cream over the counter as needed for joint and muscle pain     No current facility-administered medications for this visit.     REVIEW OF SYSTEMS:   Constitutional: Denies fevers, chills or night sweats Eyes: Denies blurriness of vision Ears, nose, mouth, throat, and face: Denies mucositis or sore throat Respiratory: Denies cough, dyspnea or wheezes Cardiovascular: Denies palpitation, chest discomfort or lower extremity swelling Gastrointestinal:  Denies nausea, heartburn or change in bowel habits Skin: Denies abnormal skin rashes Lymphatics: Denies new lymphadenopathy or easy bruising Neurological:Denies numbness, tingling or new weaknesses Behavioral/Psych: Mood is stable, no new changes  All other systems were reviewed with the patient  and are negative.  PHYSICAL EXAMINATION: ECOG PERFORMANCE STATUS: 1 - Symptomatic but completely ambulatory  Vitals:   10/27/19 1113  BP: (!) 150/63  Pulse: 69  Resp: 18  Temp: 98.3 F (36.8 C)  SpO2: 97%   Filed Weights   10/27/19 1113  Weight: 192 lb 3.2 oz (87.2 kg)    GENERAL:alert, no distress and comfortable NEURO: alert & oriented x 3 with fluent speech, no focal motor/sensory deficits  LABORATORY DATA:  I have reviewed the data as listed     Component Value Date/Time   NA 140 11/02/2016 1403   NA 144 02/12/2014 0849   K 4.3 11/02/2016 1403   K 3.6 02/12/2014 0849   CL 106 11/02/2016 1403   CL 109 (H) 12/23/2012 1100   CO2 28 11/02/2016 1403   CO2 24 02/12/2014 0849   GLUCOSE 99 11/02/2016 1403   GLUCOSE 152 (H) 02/12/2014 0849   GLUCOSE 106 (H) 12/23/2012 1100   BUN 18 11/02/2016 1403   BUN 15.4 02/12/2014 0849   CREATININE 0.75 11/02/2016 1403   CREATININE 0.8 02/12/2014 0849   CALCIUM 10.6 (H) 11/02/2016 1403   CALCIUM 10.7 (H) 02/12/2014 0849   PROT 7.1 08/22/2015 1041   PROT 6.3 (L) 02/12/2014 0849   ALBUMIN 4.1 08/22/2015 1041   ALBUMIN 3.4 (L) 02/12/2014 0849   AST 17 08/22/2015 1041   AST 13 02/12/2014 0849  ALT 16 08/22/2015 1041   ALT 8 02/12/2014 0849   ALKPHOS 89 08/22/2015 1041   ALKPHOS 78 02/12/2014 0849   BILITOT 0.5 08/22/2015 1041   BILITOT 0.25 02/12/2014 0849   GFRNONAA 80 (L) 08/22/2011 1205   GFRAA >90 08/22/2011 1205    No results found for: SPEP, UPEP  Lab Results  Component Value Date   WBC 6.0 10/27/2019   NEUTROABS 3.5 10/27/2019   HGB 12.1 10/27/2019   HCT 36.9 10/27/2019   MCV 85.6 10/27/2019   PLT 328 10/27/2019      Chemistry      Component Value Date/Time   NA 140 11/02/2016 1403   NA 144 02/12/2014 0849   K 4.3 11/02/2016 1403   K 3.6 02/12/2014 0849   CL 106 11/02/2016 1403   CL 109 (H) 12/23/2012 1100   CO2 28 11/02/2016 1403   CO2 24 02/12/2014 0849   BUN 18 11/02/2016 1403   BUN 15.4  02/12/2014 0849   CREATININE 0.75 11/02/2016 1403   CREATININE 0.8 02/12/2014 0849      Component Value Date/Time   CALCIUM 10.6 (H) 11/02/2016 1403   CALCIUM 10.7 (H) 02/12/2014 0849   ALKPHOS 89 08/22/2015 1041   ALKPHOS 78 02/12/2014 0849   AST 17 08/22/2015 1041   AST 13 02/12/2014 0849   ALT 16 08/22/2015 1041   ALT 8 02/12/2014 0849   BILITOT 0.5 08/22/2015 1041   BILITOT 0.25 02/12/2014 0849

## 2019-10-27 NOTE — Assessment & Plan Note (Signed)
She is not anemic but she is getting worsening iron deficient She is symptomatic with leg cramps The patient is known to have angiodysplasia of the colon that predispose her to chronic bleeding I recommend we proceed with intravenous iron infusion today and next week I plan to recheck her iron studies again in 3 months It appears that she needs at least 4 iron infusion per year to avoid getting complications from severe iron deficiency anemia  The most likely cause of her anemia is due to chronic blood loss/malabsorption syndrome. We discussed some of the risks, benefits, and alternatives of intravenous iron infusions. The patient is symptomatic from anemia and the iron level is critically low. She tolerated oral iron supplement poorly and desires to achieved higher levels of iron faster for adequate hematopoesis. Some of the side-effects to be expected including risks of infusion reactions, phlebitis, headaches, nausea and fatigue.  The patient is willing to proceed. Patient education material was dispensed.  Goal is to keep ferritin level greater than 50 and resolution of anemia

## 2019-10-30 ENCOUNTER — Telehealth: Payer: Self-pay | Admitting: Hematology and Oncology

## 2019-10-30 NOTE — Telephone Encounter (Signed)
Scheduled per 1/15 sch msg. Called and spoke with pt, confirmed 4/21 appt

## 2019-11-03 ENCOUNTER — Other Ambulatory Visit: Payer: Self-pay

## 2019-11-03 ENCOUNTER — Inpatient Hospital Stay: Payer: Medicare Other

## 2019-11-03 VITALS — BP 125/70 | HR 66 | Temp 97.7°F | Resp 18

## 2019-11-03 DIAGNOSIS — D509 Iron deficiency anemia, unspecified: Secondary | ICD-10-CM

## 2019-11-03 DIAGNOSIS — K5520 Angiodysplasia of colon without hemorrhage: Secondary | ICD-10-CM

## 2019-11-03 MED ORDER — ACETAMINOPHEN 325 MG PO TABS
ORAL_TABLET | ORAL | Status: AC
  Filled 2019-11-03: qty 2

## 2019-11-03 MED ORDER — ACETAMINOPHEN 325 MG PO TABS
650.0000 mg | ORAL_TABLET | Freq: Once | ORAL | Status: AC
  Administered 2019-11-03: 650 mg via ORAL

## 2019-11-03 MED ORDER — SODIUM CHLORIDE 0.9 % IV SOLN
INTRAVENOUS | Status: DC
  Filled 2019-11-03: qty 250

## 2019-11-03 MED ORDER — DIPHENHYDRAMINE HCL 25 MG PO TABS
25.0000 mg | ORAL_TABLET | Freq: Once | ORAL | Status: AC
  Administered 2019-11-03: 25 mg via ORAL
  Filled 2019-11-03: qty 1

## 2019-11-03 MED ORDER — SODIUM CHLORIDE 0.9 % IV SOLN
510.0000 mg | Freq: Once | INTRAVENOUS | Status: AC
  Administered 2019-11-03: 510 mg via INTRAVENOUS
  Filled 2019-11-03: qty 510

## 2019-11-03 MED ORDER — DIPHENHYDRAMINE HCL 25 MG PO CAPS
ORAL_CAPSULE | ORAL | Status: AC
  Filled 2019-11-03: qty 1

## 2019-11-03 NOTE — Patient Instructions (Signed)

## 2019-11-20 ENCOUNTER — Telehealth: Payer: Self-pay

## 2019-11-20 NOTE — Telephone Encounter (Signed)
She called and asked if it is okay to get the COVID vaccine? Does Dr. Bertis Ruddy prefer a certain vaccine? She had the pneumonia vaccine in 1996 and after getting the vaccine she had severe chest pain. Her MD at the time told her to never get the pneumonia vaccine again. She developed the chest pain after leaving the office and never got medical treatment for the chest pain. The pain went away eventually.

## 2019-11-21 NOTE — Telephone Encounter (Signed)
Called and given below message. She verbalized understanding. 

## 2019-11-21 NOTE — Telephone Encounter (Signed)
She is only getting IV iron She can get COVID vaccine but the decision is up to her whether she wants it or not due to her history of problems with vaccine

## 2019-11-30 ENCOUNTER — Other Ambulatory Visit: Payer: Medicare Other

## 2020-01-31 ENCOUNTER — Other Ambulatory Visit: Payer: Self-pay

## 2020-01-31 ENCOUNTER — Inpatient Hospital Stay: Payer: Medicare Other | Attending: Hematology and Oncology

## 2020-01-31 ENCOUNTER — Telehealth: Payer: Self-pay

## 2020-01-31 ENCOUNTER — Other Ambulatory Visit: Payer: Self-pay | Admitting: Hematology and Oncology

## 2020-01-31 DIAGNOSIS — D509 Iron deficiency anemia, unspecified: Secondary | ICD-10-CM | POA: Insufficient documentation

## 2020-01-31 DIAGNOSIS — D500 Iron deficiency anemia secondary to blood loss (chronic): Secondary | ICD-10-CM

## 2020-01-31 LAB — CBC WITH DIFFERENTIAL/PLATELET
Abs Immature Granulocytes: 0.01 10*3/uL (ref 0.00–0.07)
Basophils Absolute: 0 10*3/uL (ref 0.0–0.1)
Basophils Relative: 0 %
Eosinophils Absolute: 0.3 10*3/uL (ref 0.0–0.5)
Eosinophils Relative: 5 %
HCT: 34.1 % — ABNORMAL LOW (ref 36.0–46.0)
Hemoglobin: 10.5 g/dL — ABNORMAL LOW (ref 12.0–15.0)
Immature Granulocytes: 0 %
Lymphocytes Relative: 23 %
Lymphs Abs: 1.4 10*3/uL (ref 0.7–4.0)
MCH: 26.9 pg (ref 26.0–34.0)
MCHC: 30.8 g/dL (ref 30.0–36.0)
MCV: 87.2 fL (ref 80.0–100.0)
Monocytes Absolute: 0.6 10*3/uL (ref 0.1–1.0)
Monocytes Relative: 10 %
Neutro Abs: 3.5 10*3/uL (ref 1.7–7.7)
Neutrophils Relative %: 62 %
Platelets: 378 10*3/uL (ref 150–400)
RBC: 3.91 MIL/uL (ref 3.87–5.11)
RDW: 14.5 % (ref 11.5–15.5)
WBC: 5.8 10*3/uL (ref 4.0–10.5)
nRBC: 0 % (ref 0.0–0.2)

## 2020-01-31 LAB — FERRITIN: Ferritin: 8 ng/mL — ABNORMAL LOW (ref 11–307)

## 2020-01-31 LAB — IRON AND TIBC
Iron: 21 ug/dL — ABNORMAL LOW (ref 41–142)
Saturation Ratios: 6 % — ABNORMAL LOW (ref 21–57)
TIBC: 386 ug/dL (ref 236–444)
UIBC: 365 ug/dL (ref 120–384)

## 2020-01-31 NOTE — Telephone Encounter (Signed)
-----   Message from [name], MD sent at 01/31/2020  1:04 PM EDT ----- Regarding: recurrent iron def anemia She needs IV iron again I can see her next Friday and IV iron same day If she agrees, let me know and I will send schediling msg

## 2020-01-31 NOTE — Telephone Encounter (Signed)
Called and given below message. She verbalized understanding. She is agreeable to appt next Friday.

## 2020-02-02 ENCOUNTER — Telehealth: Payer: Self-pay | Admitting: Hematology and Oncology

## 2020-02-02 NOTE — Telephone Encounter (Signed)
Scheduled appt per 4/22 sch message pt is aware of appt date and time   

## 2020-02-09 ENCOUNTER — Inpatient Hospital Stay: Payer: Medicare Other

## 2020-02-09 ENCOUNTER — Inpatient Hospital Stay (HOSPITAL_BASED_OUTPATIENT_CLINIC_OR_DEPARTMENT_OTHER): Payer: Medicare Other | Admitting: Hematology and Oncology

## 2020-02-09 ENCOUNTER — Encounter: Payer: Self-pay | Admitting: Hematology and Oncology

## 2020-02-09 ENCOUNTER — Other Ambulatory Visit: Payer: Self-pay | Admitting: Hematology and Oncology

## 2020-02-09 ENCOUNTER — Inpatient Hospital Stay: Payer: Medicare Other | Admitting: Hematology and Oncology

## 2020-02-09 ENCOUNTER — Other Ambulatory Visit: Payer: Self-pay

## 2020-02-09 VITALS — BP 131/83 | HR 80 | Resp 18

## 2020-02-09 DIAGNOSIS — R6889 Other general symptoms and signs: Secondary | ICD-10-CM | POA: Diagnosis not present

## 2020-02-09 DIAGNOSIS — D500 Iron deficiency anemia secondary to blood loss (chronic): Secondary | ICD-10-CM

## 2020-02-09 DIAGNOSIS — K5520 Angiodysplasia of colon without hemorrhage: Secondary | ICD-10-CM

## 2020-02-09 DIAGNOSIS — D509 Iron deficiency anemia, unspecified: Secondary | ICD-10-CM | POA: Diagnosis not present

## 2020-02-09 MED ORDER — ACETAMINOPHEN 160 MG/5ML PO SOLN
650.0000 mg | Freq: Once | ORAL | Status: AC
  Administered 2020-02-09: 650 mg via ORAL

## 2020-02-09 MED ORDER — DIPHENHYDRAMINE HCL 50 MG/ML IJ SOLN
INTRAMUSCULAR | Status: AC
  Filled 2020-02-09: qty 1

## 2020-02-09 MED ORDER — ACETAMINOPHEN 160 MG/5ML PO SOLN
ORAL | Status: AC
  Filled 2020-02-09: qty 20.3

## 2020-02-09 MED ORDER — ACETAMINOPHEN 325 MG PO TABS: 650.0000 mg | ORAL_TABLET | Freq: Once | ORAL | Status: DC

## 2020-02-09 MED ORDER — DIPHENHYDRAMINE HCL 50 MG/ML IJ SOLN
25.0000 mg | Freq: Once | INTRAMUSCULAR | Status: AC
  Administered 2020-02-09: 25 mg via INTRAVENOUS

## 2020-02-09 MED ORDER — SODIUM CHLORIDE 0.9 % IV SOLN
Freq: Once | INTRAVENOUS | Status: AC
  Filled 2020-02-09: qty 250

## 2020-02-09 MED ORDER — SODIUM CHLORIDE 0.9 % IV SOLN
510.0000 mg | Freq: Once | INTRAVENOUS | Status: AC
  Administered 2020-02-09: 15:00:00 510 mg via INTRAVENOUS
  Filled 2020-02-09: qty 510

## 2020-02-09 NOTE — Patient Instructions (Signed)

## 2020-02-09 NOTE — Progress Notes (Signed)
[facility] OFFICE PROGRESS NOTE  [name], MD  ASSESSMENT & PLAN:  Iron deficiency anemia Her intravenous iron requirement has increased over the last year I recommend return appointment to see her gastroenterologist for repeat EGD The patient made it very clear that she does not want colonoscopy She continues to take intermittent NSAID and I am concerned she may have developed some bleeding stomach ulcer that contributed to her persistent, recurrent iron deficiency anemia The most likely cause of her anemia is due to chronic blood loss/malabsorption syndrome. We discussed some of the risks, benefits, and alternatives of intravenous iron infusions. The patient is symptomatic from anemia and the iron level is critically low. She tolerated oral iron supplement poorly and desires to achieved higher levels of iron faster for adequate hematopoesis. Some of the side-effects to be expected including risks of infusion reactions, phlebitis, headaches, nausea and fatigue.  The patient is willing to proceed. Patient education material was dispensed.  Goal is to keep ferritin level greater than 50 and resolution of anemia I will bring her back again early June for further follow-up   Angiodysplasia of colon She has history of iron deficiency anemia and angiodysplasia of the colon. Prior EGD showed evidence of stomach ulcers Her last evaluation was in 2015 and at this point in time, I recommend repeat evaluation and she is in agreement I will contact her GI physician for this  Multiple complaints The patient has significant issues related to the fact that her magnesium supplement is hard for her to swallow, and she is not pleased with her prior ENT physician and is in the process of getting another new ENT physician to evaluate her dry mucous membrane within her nose. She is also in the process of getting evaluated by her eye doctors for dry eyes She has seen Dr. Yetta Barre from dermatology  and was prescribed some cream for her skin I listened to all her complaints as usual and I would defer to multiple specialist for further management   No orders of the defined types were placed in this encounter.   The total time spent in the appointment was 20 minutes encounter with patients including review of chart and various tests results, discussions about plan of care and coordination of care plan   All questions were answered. The patient knows to call the clinic with any problems, questions or concerns. No barriers to learning was detected.    [name], MD 4/30/20216:10 PM  INTERVAL HISTORY: Melanie [name] 85 y.o. female returns for further follow-up and iron treatment for recurrent iron deficiency anemia She has multiple new issues including her eyes which appears dried out, significant dry mucous membrane in her nose, as well as dissatisfaction over her magnesium supplement She was prescribed a cream by dermatologist to use over her skin The patient denies any recent signs or symptoms of bleeding such as spontaneous epistaxis, hematuria or hematochezia.  SUMMARY OF HEMATOLOGIC HISTORY:  The patient has been complaining of fatigue and was found to have severe iron deficiency anemia. Approximately a year or 2 ago, she received 2 units of blood transfusion because of this. She had extensive GI evaluation with EGD and colonoscopy and was never found to have any sort of GI bleed. She has received 4 intravenous doses of iron infusion and the last infusion in 3 months ago. Bone marrow aspirate and biopsy in January 2013 showed no evidence of malignancy or persistent absence of iron stores. In 2014, she received  6 doses of intravenous iron infusion. She was referred to gastroenterology for further evaluation due to suspected GI bleed. Repeat GI endoscopy dated 12/26/2013 showed angiodysplastic lesion and moderate diverticulosis. The patient had received numerous intravenous iron  infusion almost on a monthly basis from 03/15/2013 to 01/17/15. Her last intravenous iron was in July 2016, and then resumed in 2018  I have reviewed the past medical history, past surgical history, social history and family history with the patient and they are unchanged from previous note.  ALLERGIES:  is allergic to cephalexin; clindamycin/lincomycin; codeine; levofloxacin; morphine and related; other; quinolones; sulfa antibiotics; tramadol; amoxicillin; fexofenadine hcl; floxin [ocuflox]; neurontin [gabapentin]; and septra [bactrim].  MEDICATIONS:  Current Outpatient Medications  Medication Sig Dispense Refill  . aspirin 81 MG tablet Take 81 mg by mouth daily.     . cholecalciferol (VITAMIN D) 1000 units tablet Take 5,000 Units by mouth daily.    . Cyanocobalamin (VITAMIN B12 PO) Take 500 mcg by mouth daily.     . folic acid (FOLVITE) 1 MG tablet TAKE 1 TABLET BY MOUTH EVERY DAY 90 tablet 3  . metoprolol (LOPRESSOR) 50 MG tablet Take 1 tablet (50 mg total) by mouth 2 (two) times daily. 180 tablet 1  . polyethylene glycol powder (GLYCOLAX/MIRALAX) powder Take 17 g by mouth daily as needed. 1051 g 1  . UNABLE TO FIND Med Name:  [facility] Lidocaine Cream over the counter as needed for joint and muscle pain     No current facility-administered medications for this visit.     REVIEW OF SYSTEMS:   Constitutional: Denies fevers, chills or night sweats Eyes: Denies blurriness of vision Ears, nose, mouth, throat, and face: Denies mucositis or sore throat Respiratory: Denies cough, dyspnea or wheezes Cardiovascular: Denies palpitation, chest discomfort or lower extremity swelling Gastrointestinal:  Denies nausea, heartburn or change in bowel habits Lymphatics: Denies new lymphadenopathy or easy bruising Neurological:Denies numbness, tingling or new weaknesses Behavioral/Psych: Mood is stable, no new changes  All other systems were reviewed with the patient and are negative.  PHYSICAL  EXAMINATION: ECOG PERFORMANCE STATUS: 1 - Symptomatic but completely ambulatory  Vitals:   02/09/20 1326  BP: 137/61  Pulse: 85  Resp: 18  Temp: 98.5 F (36.9 C)  SpO2: 97%   Filed Weights   02/09/20 1326  Weight: 194 lb 3.2 oz (88.1 kg)    GENERAL:alert, no distress and comfortable NEURO: alert & oriented x 3 with fluent speech, no focal motor/sensory deficits  LABORATORY DATA:  I have reviewed the data as listed     Component Value Date/Time   NA 140 11/02/2016 1403   NA 144 02/12/2014 0849   K 4.3 11/02/2016 1403   K 3.6 02/12/2014 0849   CL 106 11/02/2016 1403   CL 109 (H) 12/23/2012 1100   CO2 28 11/02/2016 1403   CO2 24 02/12/2014 0849   GLUCOSE 99 11/02/2016 1403   GLUCOSE 152 (H) 02/12/2014 0849   GLUCOSE 106 (H) 12/23/2012 1100   BUN 18 11/02/2016 1403   BUN 15.4 02/12/2014 0849   CREATININE 0.75 11/02/2016 1403   CREATININE 0.8 02/12/2014 0849   CALCIUM 10.6 (H) 11/02/2016 1403   CALCIUM 10.7 (H) 02/12/2014 0849   PROT 7.1 08/22/2015 1041   PROT 6.3 (L) 02/12/2014 0849   ALBUMIN 4.1 08/22/2015 1041   ALBUMIN 3.4 (L) 02/12/2014 0849   AST 17 08/22/2015 1041   AST 13 02/12/2014 0849   ALT 16 08/22/2015 1041   ALT 8 02/12/2014  0849   ALKPHOS 89 08/22/2015 1041   ALKPHOS 78 02/12/2014 0849   BILITOT 0.5 08/22/2015 1041   BILITOT 0.25 02/12/2014 0849   GFRNONAA 80 (L) 08/22/2011 1205   GFRAA >90 08/22/2011 1205    No results found for: SPEP, UPEP  Lab Results  Component Value Date   WBC 5.8 01/31/2020   NEUTROABS 3.5 01/31/2020   HGB 10.5 (L) 01/31/2020   HCT 34.1 (L) 01/31/2020   MCV 87.2 01/31/2020   PLT 378 01/31/2020      Chemistry      Component Value Date/Time   NA 140 11/02/2016 1403   NA 144 02/12/2014 0849   K 4.3 11/02/2016 1403   K 3.6 02/12/2014 0849   CL 106 11/02/2016 1403   CL 109 (H) 12/23/2012 1100   CO2 28 11/02/2016 1403   CO2 24 02/12/2014 0849   BUN 18 11/02/2016 1403   BUN 15.4 02/12/2014 0849   CREATININE  0.75 11/02/2016 1403   CREATININE 0.8 02/12/2014 0849      Component Value Date/Time   CALCIUM 10.6 (H) 11/02/2016 1403   CALCIUM 10.7 (H) 02/12/2014 0849   ALKPHOS 89 08/22/2015 1041   ALKPHOS 78 02/12/2014 0849   AST 17 08/22/2015 1041   AST 13 02/12/2014 0849   ALT 16 08/22/2015 1041   ALT 8 02/12/2014 0849   BILITOT 0.5 08/22/2015 1041   BILITOT 0.25 02/12/2014 0849

## 2020-02-09 NOTE — Assessment & Plan Note (Signed)
She has history of iron deficiency anemia and angiodysplasia of the colon. Prior EGD showed evidence of stomach ulcers Her last evaluation was in 2015 and at this point in time, I recommend repeat evaluation and she is in agreement I will contact her GI physician for this

## 2020-02-09 NOTE — Assessment & Plan Note (Signed)
Her intravenous iron requirement has increased over the last year I recommend return appointment to see her gastroenterologist for repeat EGD The patient made it very clear that she does not want colonoscopy She continues to take intermittent NSAID and I am concerned she may have developed some bleeding stomach ulcer that contributed to her persistent, recurrent iron deficiency anemia The most likely cause of her anemia is due to chronic blood loss/malabsorption syndrome. We discussed some of the risks, benefits, and alternatives of intravenous iron infusions. The patient is symptomatic from anemia and the iron level is critically low. She tolerated oral iron supplement poorly and desires to achieved higher levels of iron faster for adequate hematopoesis. Some of the side-effects to be expected including risks of infusion reactions, phlebitis, headaches, nausea and fatigue.  The patient is willing to proceed. Patient education material was dispensed.  Goal is to keep ferritin level greater than 50 and resolution of anemia I will bring her back again early June for further follow-up

## 2020-02-09 NOTE — Assessment & Plan Note (Signed)
The patient has significant issues related to the fact that her magnesium supplement is hard for her to swallow, and she is not pleased with her prior ENT physician and is in the process of getting another new ENT physician to evaluate her dry mucous membrane within her nose. She is also in the process of getting evaluated by her eye doctors for dry eyes She has seen Dr. Yetta Barre from dermatology and was prescribed some cream for her skin I listened to all her complaints as usual and I would defer to multiple specialist for further management

## 2020-02-12 ENCOUNTER — Telehealth: Payer: Self-pay | Admitting: *Deleted

## 2020-02-12 NOTE — Telephone Encounter (Signed)
Attempted to reach Ms.Cloe but unfortunately was unable to speak with her and unable to leave voicemail as one has not yet been set up. I will attempt to call her back at a later time.

## 2020-02-12 NOTE — Telephone Encounter (Signed)
Patient is scheduled for 03/13/20 at 230 pm. Patient spoke with Matthias Hughs, [facility] to schedule.

## 2020-02-12 NOTE — Telephone Encounter (Signed)
-----   Message from Beverley Fiedler, MD sent at 02/12/2020  8:50 AM EDT ----- Regarding: FW: repeat EGD Dr. Bertis Ruddy is recommending another EGD. Please add this to the record and have her come for an OV to discuss. IDA, hx HH, duodenal ulcers, and angioectasias Thanks JMP ----- Message ----- From: [name], MD Sent: 02/09/2020   6:12 PM EDT To: Beverley Fiedler, MD Subject: repeat EGD                                     Hi Jay,  Just want to touch base with you She has increased IV iron infusion requirement recently Has admitted to taking occasional NSAID Would you be so kind to set up repeat EGD?  Thanks, Ni

## 2020-02-14 ENCOUNTER — Ambulatory Visit: Payer: Medicare Other | Admitting: Internal Medicine

## 2020-02-16 ENCOUNTER — Other Ambulatory Visit: Payer: Self-pay

## 2020-02-16 ENCOUNTER — Inpatient Hospital Stay: Payer: Medicare Other | Attending: Hematology and Oncology

## 2020-02-16 VITALS — BP 117/61 | HR 71 | Temp 97.8°F | Resp 17

## 2020-02-16 DIAGNOSIS — D509 Iron deficiency anemia, unspecified: Secondary | ICD-10-CM | POA: Diagnosis not present

## 2020-02-16 DIAGNOSIS — K5520 Angiodysplasia of colon without hemorrhage: Secondary | ICD-10-CM

## 2020-02-16 MED ORDER — DIPHENHYDRAMINE HCL 50 MG/ML IJ SOLN
INTRAMUSCULAR | Status: AC
  Filled 2020-02-16: qty 1

## 2020-02-16 MED ORDER — ACETAMINOPHEN 325 MG PO TABS
ORAL_TABLET | ORAL | Status: AC
  Filled 2020-02-16: qty 2

## 2020-02-16 MED ORDER — SODIUM CHLORIDE 0.9 % IV SOLN
Freq: Once | INTRAVENOUS | Status: AC
  Filled 2020-02-16: qty 250

## 2020-02-16 MED ORDER — DIPHENHYDRAMINE HCL 50 MG/ML IJ SOLN
25.0000 mg | Freq: Once | INTRAMUSCULAR | Status: AC
  Administered 2020-02-16: 25 mg via INTRAVENOUS

## 2020-02-16 MED ORDER — SODIUM CHLORIDE 0.9 % IV SOLN
510.0000 mg | Freq: Once | INTRAVENOUS | Status: AC
  Administered 2020-02-16: 510 mg via INTRAVENOUS
  Filled 2020-02-16: qty 510

## 2020-02-16 MED ORDER — ACETAMINOPHEN 325 MG PO TABS
650.0000 mg | ORAL_TABLET | Freq: Once | ORAL | Status: AC
  Administered 2020-02-16: 650 mg via ORAL

## 2020-02-16 NOTE — Patient Instructions (Signed)

## 2020-03-13 ENCOUNTER — Encounter: Payer: Self-pay | Admitting: Internal Medicine

## 2020-03-13 ENCOUNTER — Ambulatory Visit (INDEPENDENT_AMBULATORY_CARE_PROVIDER_SITE_OTHER): Payer: Medicare Other | Admitting: Internal Medicine

## 2020-03-13 VITALS — BP 124/76 | HR 72 | Ht 62.0 in | Wt 188.8 lb

## 2020-03-13 DIAGNOSIS — Z01818 Encounter for other preprocedural examination: Secondary | ICD-10-CM

## 2020-03-13 DIAGNOSIS — D509 Iron deficiency anemia, unspecified: Secondary | ICD-10-CM | POA: Diagnosis not present

## 2020-03-13 DIAGNOSIS — K449 Diaphragmatic hernia without obstruction or gangrene: Secondary | ICD-10-CM | POA: Diagnosis not present

## 2020-03-13 DIAGNOSIS — Z8719 Personal history of other diseases of the digestive system: Secondary | ICD-10-CM | POA: Diagnosis not present

## 2020-03-13 DIAGNOSIS — K5909 Other constipation: Secondary | ICD-10-CM

## 2020-03-13 NOTE — Patient Instructions (Signed)
You have been scheduled for an endoscopy. Please follow written instructions given to you at your visit today. If you use inhalers (even only as needed), please bring them with you on the day of your procedure. Your physician has requested that you go to www.startemmi.com and enter the access code given to you at your visit today. This web site gives a general overview about your procedure. However, you should still follow specific instructions given to you by our office regarding your preparation for the procedure.  If you are age 47 or older, your body mass index should be between 23-30. Your Body mass index is 34.53 kg/m. If this is out of the aforementioned range listed, please consider follow up with your Primary Care Provider.  If you are age 67 or younger, your body mass index should be between 19-25. Your Body mass index is 34.53 kg/m. If this is out of the aformentioned range listed, please consider follow up with your Primary Care Provider.   Due to recent changes in healthcare laws, you may see the results of your imaging and laboratory studies on MyChart before your provider has had a chance to review them.  We understand that in some cases there may be results that are confusing or concerning to you. Not all laboratory results come back in the same time frame and the provider may be waiting for multiple results in order to interpret others.  Please give [place] 48 hours in order for your provider to thoroughly review all the results before contacting the office for clarification of your results.

## 2020-03-13 NOTE — Progress Notes (Signed)
Subjective:    Patient ID: Melanie [name], female    DOB: December 22, 1934, 85 y.o.   MRN: [id number]  HPI Melanie [name] is an 85 year old female with a history of iron deficiency anemia, GERD with hiatal hernia, gastritis and duodenal ulcer disease, colonic angioectasias who is seen for follow-up.  She was last seen in the office on 05/09/2019.  I received contact from Dr. Alvy Bimler her hematologist whom she sees for iron deficiency anemia.  Melanie [name] has required increased iron supplementation this year raising a question of GI blood loss versus malabsorption.  She reports that she has been feeling fairly well from a GI perspective.  She does experience some epigastric pain which is worse if she overeats.  She has occasional belching which she describes as in between a "hiccup and a belch".  She does not feel heartburn.  No dysphagia.  Bowels are working without blood in her stool nor melena as long as she is using MiraLAX and prune juice.  She does have bilateral lower extremity discomfort and her extremities feel very cold.   Review of Systems As per HPI, otherwise negative  Current Medications, Allergies, Past Medical History, Past Surgical History, Family History and Social History were reviewed in [employer] record.     Objective:   Physical Exam BP 124/76   Pulse 72   Ht 5\' 2"  (1.575 m)   Wt 188 lb 12.8 oz (85.6 kg)   SpO2 96%   BMI 34.53 kg/m  Gen: awake, alert, NAD HEENT: anicteric CV: RRR, no mrg Pulm: CTA b/l Abd: soft, NT/ND, +BS throughout Ext: no c/c/e Neuro: nonfocal  CBC    Component Value Date/Time   WBC 5.8 01/31/2020 1053   RBC 3.91 01/31/2020 1053   HGB 10.5 (L) 01/31/2020 1053   HGB 13.7 07/15/2018 1144   HGB 10.3 (L) 09/08/2017 1114   HCT 34.1 (L) 01/31/2020 1053   HCT 33.0 (L) 09/08/2017 1114   PLT 378 01/31/2020 1053   PLT 247 07/15/2018 1144   PLT 393 09/08/2017 1114   MCV 87.2 01/31/2020 1053   MCV 79.2 (L) 09/08/2017  1114   MCH 26.9 01/31/2020 1053   MCHC 30.8 01/31/2020 1053   RDW 14.5 01/31/2020 1053   RDW 18.3 (H) 09/08/2017 1114   LYMPHSABS 1.4 01/31/2020 1053   LYMPHSABS 1.5 09/08/2017 1114   MONOABS 0.6 01/31/2020 1053   MONOABS 0.4 09/08/2017 1114   EOSABS 0.3 01/31/2020 1053   EOSABS 0.2 09/08/2017 1114   BASOSABS 0.0 01/31/2020 1053   BASOSABS 0.0 09/08/2017 1114   Iron/TIBC/Ferritin/ %Sat    Component Value Date/Time   IRON 21 (L) 01/31/2020 1053   IRON 116 04/09/2015 1029   TIBC 386 01/31/2020 1053   TIBC 305 04/09/2015 1029   FERRITIN 8 (L) 01/31/2020 1053   FERRITIN 7 (L) 09/08/2017 1114   IRONPCTSAT 6 (L) 01/31/2020 1053   IRONPCTSAT 38 04/09/2015 1029   IRONPCTSAT 8 (L) 12/23/2012 1101        Assessment & Plan:  85 year old female with a history of iron deficiency anemia, GERD with hiatal hernia, gastritis and duodenal ulcer disease, colonic angioectasias who is seen for follow-up.  She was last seen in the office on 05/09/2019.  1. IDA/hx of GERD with hiatal hernia, gastroduodenitis and duodenal ulcer disease/colonic angiectasia --we discussed her sources of iron deficiency and it is noted that she has required more frequent IV iron to maintain near normal hemoglobins in the last 12 months.  We discussed upper endoscopy and colonoscopy but she is opposed to repeat colonoscopy.  She is agreeable to upper endoscopy which I think is reasonable to rule out esophagitis, Cameron's lesions associated with hiatal hernia along with ulcer disease which she has had in the past.  She is not currently on PPI therapy but this may be warranted based on EGD findings.  We discussed the risk, benefits and alternatives to upper endoscopy and she is agreeable wishes to proceed --EGD in the [facility] --Continue follow-up with Dr. Bertis Ruddy and intermittent IV iron as needed  2.  Chronic constipation --continue MiraLAX 17 g daily and prune juice  30 minutes total spent today including patient facing time,  coordination of care, reviewing medical history/procedures/pertinent radiology studies, and documentation of the encounter.

## 2020-03-15 ENCOUNTER — Telehealth: Payer: Self-pay | Admitting: Internal Medicine

## 2020-03-15 ENCOUNTER — Other Ambulatory Visit: Payer: Self-pay

## 2020-03-15 ENCOUNTER — Telehealth: Payer: Self-pay

## 2020-03-15 ENCOUNTER — Inpatient Hospital Stay: Payer: Medicare Other | Attending: Hematology and Oncology

## 2020-03-15 DIAGNOSIS — D509 Iron deficiency anemia, unspecified: Secondary | ICD-10-CM | POA: Insufficient documentation

## 2020-03-15 DIAGNOSIS — D500 Iron deficiency anemia secondary to blood loss (chronic): Secondary | ICD-10-CM

## 2020-03-15 LAB — CBC WITH DIFFERENTIAL/PLATELET
Abs Immature Granulocytes: 0.01 10*3/uL (ref 0.00–0.07)
Basophils Absolute: 0 10*3/uL (ref 0.0–0.1)
Basophils Relative: 0 %
Eosinophils Absolute: 0.2 10*3/uL (ref 0.0–0.5)
Eosinophils Relative: 4 %
HCT: 42.5 % (ref 36.0–46.0)
Hemoglobin: 13.7 g/dL (ref 12.0–15.0)
Immature Granulocytes: 0 %
Lymphocytes Relative: 23 %
Lymphs Abs: 1.2 10*3/uL (ref 0.7–4.0)
MCH: 28 pg (ref 26.0–34.0)
MCHC: 32.2 g/dL (ref 30.0–36.0)
MCV: 86.9 fL (ref 80.0–100.0)
Monocytes Absolute: 0.5 10*3/uL (ref 0.1–1.0)
Monocytes Relative: 10 %
Neutro Abs: 3.2 10*3/uL (ref 1.7–7.7)
Neutrophils Relative %: 63 %
Platelets: 256 10*3/uL (ref 150–400)
RBC: 4.89 MIL/uL (ref 3.87–5.11)
RDW: 18.5 % — ABNORMAL HIGH (ref 11.5–15.5)
WBC: 5.1 10*3/uL (ref 4.0–10.5)
nRBC: 0 % (ref 0.0–0.2)

## 2020-03-15 LAB — IRON AND TIBC
Iron: 75 ug/dL (ref 41–142)
Saturation Ratios: 23 % (ref 21–57)
TIBC: 320 ug/dL (ref 236–444)
UIBC: 246 ug/dL (ref 120–384)

## 2020-03-15 LAB — FERRITIN: Ferritin: 128 ng/mL (ref 11–307)

## 2020-03-15 NOTE — Telephone Encounter (Signed)
Called and given below message. She verbalized understanding. 

## 2020-03-15 NOTE — Telephone Encounter (Signed)
I have spoken to patient who has rescheduled endoscopy to 04/16/20 at 130 pm, 1230 pm arrival and has rescheduled COVID screen to 04/12/20 at 300 pm. Patient has been advised of updated times that she will need to be NPO for procedure and updated arrival time for procedure as well. She verbalizes understanding of all new times/dates/location of appointments.

## 2020-03-15 NOTE — Telephone Encounter (Signed)
-----   Message from [name], MD sent at 03/15/2020  1:05 PM EDT ----- Regarding: pls call her, labs and iron studies are good, I will scehdule labs monthly for now

## 2020-03-15 NOTE — Telephone Encounter (Addendum)
Patient is calling to reschedule EGD. States that her care partner will only be in town certain days. The day she wants to change it to is July 7th and I let her know Dr. Rhea Belton does not have that day but she said Dr. Rhea Belton said he would work with her. Patient states she will not be home until after 12:30 if you could call after that.

## 2020-03-18 ENCOUNTER — Telehealth: Payer: Self-pay | Admitting: Hematology and Oncology

## 2020-03-18 ENCOUNTER — Telehealth: Payer: Self-pay

## 2020-03-18 NOTE — Telephone Encounter (Signed)
Scheduled appt per 6/4 sch message - pt is aware of appts and reminder letter mailed.

## 2020-03-18 NOTE — Telephone Encounter (Signed)
She called and left a message requesting copy of most recent labs. Called her back and told her labs mailed out. She verbalized understanding.

## 2020-04-12 ENCOUNTER — Other Ambulatory Visit: Payer: Self-pay | Admitting: Internal Medicine

## 2020-04-12 ENCOUNTER — Telehealth: Payer: Self-pay | Admitting: Internal Medicine

## 2020-04-12 ENCOUNTER — Ambulatory Visit (INDEPENDENT_AMBULATORY_CARE_PROVIDER_SITE_OTHER): Payer: Medicare Other

## 2020-04-12 DIAGNOSIS — Z1159 Encounter for screening for other viral diseases: Secondary | ICD-10-CM

## 2020-04-12 NOTE — Telephone Encounter (Signed)
Spoke with patient, pt wanting [place] to have her latest EKG and doppler results from her PCP. I called Dr. Chase Caller office and they stated that patient would need to sign a health information release form before her records could be faxed to [place]. Called patient to let her know that information but no answer and no voicemail set up.

## 2020-04-12 NOTE — Telephone Encounter (Signed)
Pt calling about some tests that she had done on 6/18 at with Dr, Chase Caller office. She wants to know if we received a copy of the tests.

## 2020-04-13 LAB — SARS CORONAVIRUS 2 (TAT 6-24 HRS): SARS Coronavirus 2: NEGATIVE

## 2020-04-16 ENCOUNTER — Encounter: Payer: Self-pay | Admitting: Internal Medicine

## 2020-04-16 ENCOUNTER — Other Ambulatory Visit: Payer: Self-pay

## 2020-04-16 ENCOUNTER — Ambulatory Visit (AMBULATORY_SURGERY_CENTER): Payer: Medicare Other | Admitting: Internal Medicine

## 2020-04-16 VITALS — BP 130/91 | HR 60 | Temp 96.8°F | Resp 15 | Ht 62.0 in | Wt 188.0 lb

## 2020-04-16 DIAGNOSIS — K2980 Duodenitis without bleeding: Secondary | ICD-10-CM | POA: Diagnosis present

## 2020-04-16 DIAGNOSIS — D508 Other iron deficiency anemias: Secondary | ICD-10-CM

## 2020-04-16 DIAGNOSIS — K2970 Gastritis, unspecified, without bleeding: Secondary | ICD-10-CM

## 2020-04-16 DIAGNOSIS — K2950 Unspecified chronic gastritis without bleeding: Secondary | ICD-10-CM | POA: Diagnosis not present

## 2020-04-16 MED ORDER — PANTOPRAZOLE SODIUM 40 MG PO TBEC
40.0000 mg | DELAYED_RELEASE_TABLET | Freq: Every day | ORAL | 4 refills | Status: DC
Start: 2020-04-16 — End: 2020-12-17

## 2020-04-16 MED ORDER — SODIUM CHLORIDE 0.9 % IV SOLN
500.0000 mL | Freq: Once | INTRAVENOUS | Status: DC
Start: 2020-04-16 — End: 2020-04-16

## 2020-04-16 NOTE — Telephone Encounter (Signed)
Spoke with patient, patient aware that she has to fill out health release form before we can get records. Pt states that she will get that done and get [place] the results.

## 2020-04-16 NOTE — Patient Instructions (Signed)
Handouts given:  Gastritis, Hiatal Hernia Resume previous diet Continue present medciations RESTART Pantoprazole 40mg  once daily AVOID ALL NSAIDs( aspirin, ibuprophen, aleve, ) Await pathology results Continue follow up with Dr. and replace iron IV as needed.   YOU HAD AN ENDOSCOPIC PROCEDURE TODAY AT THE [facility]:   Refer to the procedure report that was given to you for any specific questions about what was found during the examination.  If the procedure report does not answer your questions, please call your gastroenterologist to clarify.  If you requested that your care partner not be given the details of your procedure findings, then the procedure report has been included in a sealed envelope for you to review at your convenience later.  YOU SHOULD EXPECT: Some feelings of bloating in the abdomen. Passage of more gas than usual.  Walking can help get rid of the air that was put into your GI tract during the procedure and reduce the bloating. If you had a lower endoscopy (such as a colonoscopy or flexible sigmoidoscopy) you may notice spotting of blood in your stool or on the toilet paper. If you underwent a bowel prep for your procedure, you may not have a normal bowel movement for a few days.  Please Note:  You might notice some irritation and congestion in your nose or some drainage.  This is from the oxygen used during your procedure.  There is no need for concern and it should clear up in a day or so.  SYMPTOMS TO REPORT IMMEDIATELY:   Following lower endoscopy (colonoscopy or flexible sigmoidoscopy):  Excessive amounts of blood in the stool  Significant tenderness or worsening of abdominal pains  Swelling of the abdomen that is new, acute  Fever of 100F or higher   For urgent or emergent issues, a gastroenterologist can be reached at any hour by calling [phone]. Do not use MyChart messaging for urgent concerns.    DIET:  We do recommend a small meal  at first, but then you may proceed to your regular diet.  Drink plenty of fluids but you should avoid alcoholic beverages for 24 hours.  ACTIVITY:  You should plan to take it easy for the rest of today and you should NOT DRIVE or use heavy machinery until tomorrow (because of the sedation medicines used during the test).    FOLLOW UP: Our staff will call the number listed on your records 48-72 hours following your procedure to check on you and address any questions or concerns that you may have regarding the information given to you following your procedure. If we do not reach you, we will leave a message.  We will attempt to reach you two times.  During this call, we will ask if you have developed any symptoms of COVID 19. If you develop any symptoms (ie: fever, flu-like symptoms, shortness of breath, cough etc.) before then, please call [phone].  If you test positive for Covid 19 in the 2 weeks post procedure, please call and report this information to [phone].    If any biopsies were taken you will be contacted by phone or by letter within the next 1-3 weeks.  Please call us at [phone] if you have not heard about the biopsies in 3 weeks.    SIGNATURES/CONFIDENTIALITY: You and/or your care partner have signed paperwork which will be entered into your electronic medical record.  These signatures attest to the fact that that the information above on your After Visit  Summary has been reviewed and is understood.  Full responsibility of the confidentiality of this discharge information lies with you and/or your care-partner. 

## 2020-04-16 NOTE — Progress Notes (Signed)
Pt's states no medical or surgical changes since previsit or office visit. 

## 2020-04-16 NOTE — Progress Notes (Signed)
pt tolerated well. VSS. awake and to recovery. Report given to RN. Bite block inserted and removed without trauma. 

## 2020-04-16 NOTE — Progress Notes (Signed)
Called to room to assist during endoscopic procedure.  Patient ID and intended procedure confirmed with present staff. Received instructions for my participation in the procedure from the performing physician.  

## 2020-04-16 NOTE — Op Note (Signed)
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 The patient's tolerance of                            previous anesthesia was also reviewed. The risks                            and benefits of the procedure and the sedation                            options and risks were discussed with the patient.                            All questions were answered, and informed consent                            was obtained. Prior Anticoagulants: The patient has                            taken no previous anticoagulant or antiplatelet                            agents. ASA Grade Assessment: III - A patient with                            severe systemic disease. After reviewing the risks                            and benefits, the patient was deemed in                            satisfactory condition to undergo the procedure.  After obtaining informed consent, the endoscope was                            passed under direct vision. Throughout the                            procedure, the patient's blood pressure, pulse, and                            oxygen saturations were monitored continuously. The                            Endoscope was introduced through the mouth, and                             advanced to the second part of duodenum. The upper                            GI endoscopy was accomplished without difficulty.                            The patient tolerated the procedure well. Scope In: Scope Out: Findings:                 Normal mucosa was found in the entire esophagus.                           A 10 cm hiatal hernia with multiple Cameron                            erosions was found. The proximal extent of the                            gastric folds (end of tubular esophagus) was 30 cm                            from the incisors. The hiatal narrowing was 40 cm                            from the incisors.                           Multiple erosions as above with adherent heme                            without active bleeding were found in the gastric                            body. Biopsies were taken with a cold forceps for                            histology and Helicobacter pylori testing. These  are secondary to the large hiatal hernia.                           Moderate inflammation characterized by erythema was                            found in the duodenal bulb.                           The second portion of the duodenum was normal. Complications:            No immediate complications. Estimated Blood Loss:     Estimated blood loss was minimal. Impression:               - Normal mucosa was found in the entire esophagus.                           - 10 cm hiatal hernia with multiple Cameron                            erosions. Biopsied. This is at least contributing                            to progressive IDA.                           - Duodenitis.                           - Normal second portion of the duodenum. Recommendation:           - Patient has a contact number available for                            emergencies. The signs and symptoms of potential                            delayed complications were discussed  with the                            patient. Return to normal activities tomorrow.                            Written discharge instructions were provided to the                            patient.                           - Resume previous diet.                           - Continue present medications.                           - Resume PPI. Pantoprazole 40 mg once daily.                           -  Avoid all NSAIDs.                           - Await pathology results.                           - Continue follow-up with Dr. Mosetta Putt and replace iron                            IV as needed. Beverley Fiedler, MD 04/16/2020 1:57:40 PM This report has been signed electronically.

## 2020-04-17 ENCOUNTER — Other Ambulatory Visit: Payer: Self-pay | Admitting: Internal Medicine

## 2020-04-17 DIAGNOSIS — E2839 Other primary ovarian failure: Secondary | ICD-10-CM

## 2020-04-18 ENCOUNTER — Telehealth: Payer: Self-pay

## 2020-04-18 ENCOUNTER — Other Ambulatory Visit: Payer: Self-pay

## 2020-04-18 ENCOUNTER — Encounter: Payer: Medicare Other | Admitting: Internal Medicine

## 2020-04-18 ENCOUNTER — Other Ambulatory Visit: Payer: Self-pay | Admitting: Internal Medicine

## 2020-04-18 DIAGNOSIS — Z1231 Encounter for screening mammogram for malignant neoplasm of breast: Secondary | ICD-10-CM

## 2020-04-18 NOTE — Telephone Encounter (Signed)
  Follow up Call-  Call back number 04/16/2020  Post procedure Call Back phone  # Call Melanie [name] her daughter. Patient hard of hearing.  Permission to leave phone message Yes  Some recent data might be hidden     Patient questions:  Do you have a fever, pain , or abdominal swelling? No. Pain Score  0 *  Have you tolerated food without any problems? Yes.    Have you been able to return to your normal activities? Yes.    Do you have any questions about your discharge instructions: Diet   No. Medications  No. Follow up visit  No.  Do you have questions or concerns about your Care? No.  Actions: * If pain score is 4 or above: No action needed, pain <4.  Have you developed a fever since your procedure?no 2.   Have you had an respiratory symptoms (SOB or cough) since your procedure?no 3.   Have you tested positive for COVID 19 since your procedure no 4.   Have you had any family members/close contacts diagnosed with the COVID 19 since your procedure? No  If yes to any of these questions please route to Laverna Peace, RN and Charlett Lango, RN

## 2020-04-19 ENCOUNTER — Other Ambulatory Visit: Payer: Self-pay

## 2020-04-19 ENCOUNTER — Inpatient Hospital Stay: Payer: Medicare Other | Attending: Hematology and Oncology

## 2020-04-19 ENCOUNTER — Telehealth: Payer: Self-pay

## 2020-04-19 DIAGNOSIS — D509 Iron deficiency anemia, unspecified: Secondary | ICD-10-CM | POA: Diagnosis not present

## 2020-04-19 DIAGNOSIS — D500 Iron deficiency anemia secondary to blood loss (chronic): Secondary | ICD-10-CM

## 2020-04-19 LAB — CBC WITH DIFFERENTIAL/PLATELET
Abs Immature Granulocytes: 0.01 10*3/uL (ref 0.00–0.07)
Basophils Absolute: 0 10*3/uL (ref 0.0–0.1)
Basophils Relative: 1 %
Eosinophils Absolute: 0.2 10*3/uL (ref 0.0–0.5)
Eosinophils Relative: 5 %
HCT: 41.3 % (ref 36.0–46.0)
Hemoglobin: 13.5 g/dL (ref 12.0–15.0)
Immature Granulocytes: 0 %
Lymphocytes Relative: 24 %
Lymphs Abs: 1.2 10*3/uL (ref 0.7–4.0)
MCH: 27.7 pg (ref 26.0–34.0)
MCHC: 32.7 g/dL (ref 30.0–36.0)
MCV: 84.8 fL (ref 80.0–100.0)
Monocytes Absolute: 0.4 10*3/uL (ref 0.1–1.0)
Monocytes Relative: 9 %
Neutro Abs: 3 10*3/uL (ref 1.7–7.7)
Neutrophils Relative %: 61 %
Platelets: 253 10*3/uL (ref 150–400)
RBC: 4.87 MIL/uL (ref 3.87–5.11)
RDW: 18.4 % — ABNORMAL HIGH (ref 11.5–15.5)
WBC: 4.9 10*3/uL (ref 4.0–10.5)
nRBC: 0 % (ref 0.0–0.2)

## 2020-04-19 LAB — IRON AND TIBC
Iron: 68 ug/dL (ref 41–142)
Saturation Ratios: 23 % (ref 21–57)
TIBC: 300 ug/dL (ref 236–444)
UIBC: 232 ug/dL (ref 120–384)

## 2020-04-19 LAB — FERRITIN: Ferritin: 39 ng/mL (ref 11–307)

## 2020-04-19 NOTE — Telephone Encounter (Signed)
-----   Message from [name], MD sent at 04/19/2020 12:54 PM EDT ----- Regarding: keep it brief Scheduler will call for IV iron and appt to see me in a few weeks

## 2020-04-19 NOTE — Telephone Encounter (Signed)
Called and given below message. She verbalized understanding. 

## 2020-04-22 ENCOUNTER — Encounter: Payer: Self-pay | Admitting: Internal Medicine

## 2020-04-23 ENCOUNTER — Telehealth: Payer: Self-pay | Admitting: Hematology and Oncology

## 2020-04-23 NOTE — Telephone Encounter (Signed)
Scheduled per 7/9 sch msg. Called and spoke with pt, confirmed 7/19 and 7/26 appts

## 2020-04-29 ENCOUNTER — Inpatient Hospital Stay: Payer: Medicare Other

## 2020-04-29 ENCOUNTER — Telehealth: Payer: Self-pay

## 2020-04-29 ENCOUNTER — Other Ambulatory Visit: Payer: Self-pay

## 2020-04-29 VITALS — BP 143/68 | HR 64 | Temp 97.9°F | Resp 18

## 2020-04-29 DIAGNOSIS — D509 Iron deficiency anemia, unspecified: Secondary | ICD-10-CM | POA: Diagnosis not present

## 2020-04-29 DIAGNOSIS — K5520 Angiodysplasia of colon without hemorrhage: Secondary | ICD-10-CM

## 2020-04-29 MED ORDER — DIPHENHYDRAMINE HCL 50 MG/ML IJ SOLN
INTRAMUSCULAR | Status: AC
  Filled 2020-04-29: qty 1

## 2020-04-29 MED ORDER — ACETAMINOPHEN 325 MG PO TABS
650.0000 mg | ORAL_TABLET | Freq: Once | ORAL | Status: AC
  Administered 2020-04-29: 650 mg via ORAL

## 2020-04-29 MED ORDER — ACETAMINOPHEN 325 MG PO TABS
ORAL_TABLET | ORAL | Status: AC
  Filled 2020-04-29: qty 2

## 2020-04-29 MED ORDER — SODIUM CHLORIDE 0.9 % IV SOLN
510.0000 mg | Freq: Once | INTRAVENOUS | Status: AC
  Administered 2020-04-29: 510 mg via INTRAVENOUS
  Filled 2020-04-29: qty 510

## 2020-04-29 MED ORDER — DIPHENHYDRAMINE HCL 50 MG/ML IJ SOLN
25.0000 mg | Freq: Once | INTRAMUSCULAR | Status: AC
  Administered 2020-04-29: 25 mg via INTRAVENOUS

## 2020-04-29 NOTE — Telephone Encounter (Signed)
She called and left a message. She is having swelling in her feet and hands. She said she tried calling the office multiple times last week and no one returned her call. She is scheduled for IV iron today at 3:15 and is concerned. She would like appt prior to IV iron today.   Given above message to Dr. Bertis Ruddy. No room to add to schedule today. Offered appt with Marga Hoots, PA today. She declined offer and will just see Dr. Bertis Ruddy next week as scheduled. She will keep appt today for IV iron. Told her per Dr. Bertis Ruddy leg swelling is not related to iron deficiency and she should call PCP. She verbalized understanding and she has already contacted PCP.

## 2020-04-29 NOTE — Patient Instructions (Signed)

## 2020-04-30 ENCOUNTER — Other Ambulatory Visit: Payer: Self-pay | Admitting: Hematology and Oncology

## 2020-05-01 ENCOUNTER — Encounter: Payer: Self-pay | Admitting: General Practice

## 2020-05-01 NOTE — Progress Notes (Signed)
[facility] Spiritual Care Note  Referred by Ilene Qua for spiritual, emotional, and social support. Provided reflective, empathic listening as Ms Burgio shared and processed stories of relationship and identity as part of life review. We plan to follow up by phone next month.   [address] [place], [place], [facility] Pager [phone] Voicemail [phone]

## 2020-05-06 ENCOUNTER — Inpatient Hospital Stay: Payer: Medicare Other

## 2020-05-06 ENCOUNTER — Inpatient Hospital Stay (HOSPITAL_BASED_OUTPATIENT_CLINIC_OR_DEPARTMENT_OTHER): Payer: Medicare Other | Admitting: Hematology and Oncology

## 2020-05-06 ENCOUNTER — Encounter: Payer: Self-pay | Admitting: Hematology and Oncology

## 2020-05-06 ENCOUNTER — Telehealth: Payer: Self-pay | Admitting: Hematology and Oncology

## 2020-05-06 ENCOUNTER — Other Ambulatory Visit: Payer: Self-pay

## 2020-05-06 VITALS — BP 124/62 | HR 67 | Temp 97.5°F | Resp 18

## 2020-05-06 DIAGNOSIS — K5520 Angiodysplasia of colon without hemorrhage: Secondary | ICD-10-CM

## 2020-05-06 DIAGNOSIS — D500 Iron deficiency anemia secondary to blood loss (chronic): Secondary | ICD-10-CM | POA: Diagnosis not present

## 2020-05-06 DIAGNOSIS — R6889 Other general symptoms and signs: Secondary | ICD-10-CM

## 2020-05-06 DIAGNOSIS — D509 Iron deficiency anemia, unspecified: Secondary | ICD-10-CM | POA: Diagnosis not present

## 2020-05-06 MED ORDER — DIPHENHYDRAMINE HCL 50 MG/ML IJ SOLN
INTRAMUSCULAR | Status: AC
  Filled 2020-05-06: qty 1

## 2020-05-06 MED ORDER — DIPHENHYDRAMINE HCL 50 MG/ML IJ SOLN
25.0000 mg | Freq: Once | INTRAMUSCULAR | Status: AC
  Administered 2020-05-06: 25 mg via INTRAVENOUS

## 2020-05-06 MED ORDER — ACETAMINOPHEN 325 MG PO TABS
650.0000 mg | ORAL_TABLET | Freq: Once | ORAL | Status: AC
  Administered 2020-05-06: 650 mg via ORAL

## 2020-05-06 MED ORDER — SODIUM CHLORIDE 0.9 % IV SOLN
510.0000 mg | Freq: Once | INTRAVENOUS | Status: AC
  Administered 2020-05-06: 510 mg via INTRAVENOUS
  Filled 2020-05-06: qty 510

## 2020-05-06 MED ORDER — ACETAMINOPHEN 325 MG PO TABS
ORAL_TABLET | ORAL | Status: AC
  Filled 2020-05-06: qty 2

## 2020-05-06 MED ORDER — SODIUM CHLORIDE 0.9 % IV SOLN
Freq: Once | INTRAVENOUS | Status: AC
  Filled 2020-05-06: qty 250

## 2020-05-06 NOTE — Assessment & Plan Note (Signed)
The patient has significant issues related to  1) Leg swelling: She underwent ultrasound venous Doppler and arterial brachial index that was normal 2) she has significant intermittent numbness that comes and goes in her hands and feet 3) she has intermittent swelling of her joints in her hands and she would like to talk to her dermatologist about 4) she is concerned about history of pneumonia and infection but she tolerated Covid vaccination okay I listened to all her complaints as usual and I would defer to multiple specialist for further management

## 2020-05-06 NOTE — Progress Notes (Signed)
[facility] OFFICE PROGRESS NOTE  [name], MD  ASSESSMENT & PLAN:  Angiodysplasia of colon She had recent EGD performed on July 6 which confirmed signs of erosions/gastritis Biopsy was negative She will continue intravenous iron infusion to prevent severe iron deficiency anemia  Iron deficiency anemia Her intravenous iron requirement has increased over the last year The most likely cause of her anemia is due to chronic blood loss/malabsorption syndrome. We discussed some of the risks, benefits, and alternatives of intravenous iron infusions. The patient is symptomatic from anemia and the iron level is critically low. She tolerated oral iron supplement poorly and desires to achieved higher levels of iron faster for adequate hematopoesis. Some of the side-effects to be expected including risks of infusion reactions, phlebitis, headaches, nausea and fatigue.  The patient is willing to proceed. Patient education material was dispensed.  Goal is to keep ferritin level greater than 50 and resolution of anemia I will bring her back again in a few months for follow-up   Multiple complaints The patient has significant issues related to  1) Leg swelling: She underwent ultrasound venous Doppler and arterial brachial index that was normal 2) she has significant intermittent numbness that comes and goes in her hands and feet 3) she has intermittent swelling of her joints in her hands and she would like to talk to her dermatologist about 4) she is concerned about history of pneumonia and infection but she tolerated Covid vaccination okay I listened to all her complaints as usual and I would defer to multiple specialist for further management   No orders of the defined types were placed in this encounter.   The total time spent in the appointment was 20 minutes encounter with patients including review of chart and various tests results, discussions about plan of care and coordination  of care plan   All questions were answered. The patient knows to call the clinic with any problems, questions or concerns. No barriers to learning was detected.    [name], MD 7/26/20211:04 PM  INTERVAL HISTORY: Melanie [name] 85 y.o. female returns for further follow-up on iron deficiency anemia She is concerned about her intermittent leg swelling and would like to see me to discuss this because it could be side effects of iron infusion even though she had numerous iron infusions for the last few years The patient had multiple allergies to many medications and when she read the print out on her AVS, she noted that leg swelling could be a sign of iron infusion On March 29, 2020, she underwent evaluation with bilateral venous Doppler ultrasound and arterial brachial index, results were normal She went to see Dr. Rhea Belton for upper endoscopy evaluation.  I reviewed the EGD report and biopsy report The patient denies any recent signs or symptoms of bleeding such as spontaneous epistaxis, hematuria or hematochezia. 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 Approximately a year or 2 ago, she received 2 units of blood transfusion because of this. She had extensive GI evaluation with EGD and colonoscopy and was never found to have any sort of GI bleed. She has received 4 intravenous doses of iron infusion and the last infusion in 3 months ago. Bone  marrow aspirate and biopsy in January 2013 showed no evidence of malignancy or persistent absence of iron stores. In 2014, she received 6 doses of intravenous iron infusion. She was referred to gastroenterology for further evaluation due to suspected GI bleed. Repeat GI  endoscopy dated 12/26/2013 showed angiodysplastic lesion and moderate diverticulosis. The patient had received numerous intravenous iron infusion almost on a monthly basis from 03/15/2013 to 01/17/15. Her last intravenous iron was in July 2016, and then resumed in 2018  I have reviewed the past medical history, past surgical history, social history and family history with the patient and they are unchanged from previous note.  ALLERGIES:  is allergic to cephalexin, clindamycin/lincomycin, codeine, levofloxacin, morphine and related, other, quinolones, sulfa antibiotics, tramadol, amoxicillin, fexofenadine hcl, floxin [ocuflox], neurontin [gabapentin], and septra [bactrim].  MEDICATIONS:  Current Outpatient Medications  Medication Sig Dispense Refill  . aspirin 81 MG tablet Take 81 mg by mouth daily.     . cholecalciferol (VITAMIN D) 1000 units tablet Take 5,000 Units by mouth daily.    . Cyanocobalamin (VITAMIN B12 PO) Take 500 mcg by mouth daily.     . folic acid (FOLVITE) 1 MG tablet TAKE 1 TABLET BY MOUTH EVERY DAY 90 tablet 3  . metoprolol (LOPRESSOR) 50 MG tablet Take 1 tablet (50 mg total) by mouth 2 (two) times daily. 180 tablet 1  . pantoprazole (PROTONIX) 40 MG tablet Take 1 tablet (40 mg total) by mouth daily. 90 tablet 4  . polyethylene glycol powder (GLYCOLAX/MIRALAX) powder Take 17 g by mouth daily as needed. 1051 g 1  . UNABLE TO FIND Med Name:  [facility] Lidocaine Cream over the counter as needed for joint and muscle pain     No current facility-administered medications for this visit.     REVIEW OF SYSTEMS:   Constitutional: Denies fevers, chills or night sweats Eyes: Denies blurriness of vision Ears, nose, mouth, throat, and face: Denies mucositis or sore throat Respiratory: Denies cough, dyspnea or wheezes Cardiovascular: Denies palpitation, chest discomfort  Gastrointestinal:  Denies nausea, heartburn or change in bowel habits Skin: Denies abnormal skin  rashes Lymphatics: Denies new lymphadenopathy or easy bruising Neurological:Denies numbness, tingling or new weaknesses Behavioral/Psych: Mood is stable, no new changes  All other systems were reviewed with the patient and are negative.  PHYSICAL EXAMINATION: ECOG PERFORMANCE STATUS: 2 - Symptomatic, <50% confined to bed  Vitals:   05/06/20 1244  BP: 124/65  Pulse: 68  Resp: 18  Temp: 98.2 F (36.8 C)   Filed Weights   05/06/20 1244  Weight: 187 lb 9.6 oz (85.1 kg)    GENERAL:alert, no distress and comfortable HEART: I do not see signs of lower extremity edema Musculoskeletal:no cyanosis of digits and no clubbing. She has signs of osteoarthritis NEURO: alert & oriented x 3 with fluent speech, no focal motor/sensory deficits  LABORATORY DATA:  I have reviewed the data as listed     Component Value Date/Time   NA 140 11/02/2016 1403   NA 144 02/12/2014 0849   K 4.3 11/02/2016 1403   K 3.6 02/12/2014 0849   CL 106 11/02/2016 1403   CL 109 (H) 12/23/2012 1100   CO2 28 11/02/2016 1403   CO2 24 02/12/2014 0849   GLUCOSE 99 11/02/2016 1403   GLUCOSE 152 (H) 02/12/2014 0849   GLUCOSE 106 (H) 12/23/2012 1100   BUN 18 11/02/2016 1403   BUN 15.4 02/12/2014 0849   CREATININE 0.75 11/02/2016 1403   CREATININE 0.8 02/12/2014 0849  CALCIUM 10.6 (H) 11/02/2016 1403   CALCIUM 10.7 (H) 02/12/2014 0849   PROT 7.1 08/22/2015 1041   PROT 6.3 (L) 02/12/2014 0849   ALBUMIN 4.1 08/22/2015 1041   ALBUMIN 3.4 (L) 02/12/2014 0849   AST 17 08/22/2015 1041   AST 13 02/12/2014 0849   ALT 16 08/22/2015 1041   ALT 8 02/12/2014 0849   ALKPHOS 89 08/22/2015 1041   ALKPHOS 78 02/12/2014 0849   BILITOT 0.5 08/22/2015 1041   BILITOT 0.25 02/12/2014 0849   GFRNONAA 80 (L) 08/22/2011 1205   GFRAA >90 08/22/2011 1205    No results found for: SPEP, UPEP  Lab Results  Component Value Date   WBC 4.9 04/19/2020   NEUTROABS 3.0 04/19/2020   HGB 13.5 04/19/2020   HCT 41.3 04/19/2020   MCV  84.8 04/19/2020   PLT 253 04/19/2020      Chemistry      Component Value Date/Time   NA 140 11/02/2016 1403   NA 144 02/12/2014 0849   K 4.3 11/02/2016 1403   K 3.6 02/12/2014 0849   CL 106 11/02/2016 1403   CL 109 (H) 12/23/2012 1100   CO2 28 11/02/2016 1403   CO2 24 02/12/2014 0849   BUN 18 11/02/2016 1403   BUN 15.4 02/12/2014 0849   CREATININE 0.75 11/02/2016 1403   CREATININE 0.8 02/12/2014 0849      Component Value Date/Time   CALCIUM 10.6 (H) 11/02/2016 1403   CALCIUM 10.7 (H) 02/12/2014 0849   ALKPHOS 89 08/22/2015 1041   ALKPHOS 78 02/12/2014 0849   AST 17 08/22/2015 1041   AST 13 02/12/2014 0849   ALT 16 08/22/2015 1041   ALT 8 02/12/2014 0849   BILITOT 0.5 08/22/2015 1041   BILITOT 0.25 02/12/2014 0849      I reviewed her EGD report and pathology report

## 2020-05-06 NOTE — Telephone Encounter (Signed)
Scheduled appointment per 7/26 provider message. Patient's voicemail was not set up so I was unable to leave a message. I have sent a letter with scheduled appointment date and time to patient's address on file.

## 2020-05-06 NOTE — Patient Instructions (Signed)

## 2020-05-06 NOTE — Assessment & Plan Note (Signed)
Her intravenous iron requirement has increased over the last year The most likely cause of her anemia is due to chronic blood loss/malabsorption syndrome. We discussed some of the risks, benefits, and alternatives of intravenous iron infusions. The patient is symptomatic from anemia and the iron level is critically low. She tolerated oral iron supplement poorly and desires to achieved higher levels of iron faster for adequate hematopoesis. Some of the side-effects to be expected including risks of infusion reactions, phlebitis, headaches, nausea and fatigue.  The patient is willing to proceed. Patient education material was dispensed.  Goal is to keep ferritin level greater than 50 and resolution of anemia I will bring her back again in a few months for follow-up

## 2020-05-06 NOTE — Assessment & Plan Note (Signed)
She had recent EGD performed on July 6 which confirmed signs of erosions/gastritis Biopsy was negative She will continue intravenous iron infusion to prevent severe iron deficiency anemia

## 2020-05-08 ENCOUNTER — Telehealth: Payer: Self-pay

## 2020-05-08 NOTE — Telephone Encounter (Signed)
Pt called and requests that she no longer receive IV benadryl with tx d/t drowsiness. Pt raised concern stating, "I barely made it in my door before passing out on my couch. I do fine with the capsules, but when they give it to me in my IV it makes me too tired." Informed pt that I would make Dr Bertis Ruddy aware of this request. Pt verbalized thanks and understanding.

## 2020-05-13 ENCOUNTER — Other Ambulatory Visit: Payer: Self-pay | Admitting: Hematology and Oncology

## 2020-05-13 NOTE — Telephone Encounter (Signed)
I will DC altogether She has been doing fine without allergic reactions to [facility], ok to omit for future Rx

## 2020-05-20 ENCOUNTER — Inpatient Hospital Stay: Payer: Medicare Other | Attending: Hematology and Oncology

## 2020-05-20 ENCOUNTER — Telehealth: Payer: Self-pay

## 2020-05-20 ENCOUNTER — Other Ambulatory Visit: Payer: Self-pay

## 2020-05-20 DIAGNOSIS — D509 Iron deficiency anemia, unspecified: Secondary | ICD-10-CM | POA: Insufficient documentation

## 2020-05-20 DIAGNOSIS — D500 Iron deficiency anemia secondary to blood loss (chronic): Secondary | ICD-10-CM

## 2020-05-20 LAB — CBC WITH DIFFERENTIAL/PLATELET
Abs Immature Granulocytes: 0.01 10*3/uL (ref 0.00–0.07)
Basophils Absolute: 0 10*3/uL (ref 0.0–0.1)
Basophils Relative: 1 %
Eosinophils Absolute: 0.2 10*3/uL (ref 0.0–0.5)
Eosinophils Relative: 4 %
HCT: 45 % (ref 36.0–46.0)
Hemoglobin: 14.5 g/dL (ref 12.0–15.0)
Immature Granulocytes: 0 %
Lymphocytes Relative: 19 %
Lymphs Abs: 1.1 10*3/uL (ref 0.7–4.0)
MCH: 28.2 pg (ref 26.0–34.0)
MCHC: 32.2 g/dL (ref 30.0–36.0)
MCV: 87.4 fL (ref 80.0–100.0)
Monocytes Absolute: 0.5 10*3/uL (ref 0.1–1.0)
Monocytes Relative: 8 %
Neutro Abs: 3.9 10*3/uL (ref 1.7–7.7)
Neutrophils Relative %: 68 %
Platelets: 247 10*3/uL (ref 150–400)
RBC: 5.15 MIL/uL — ABNORMAL HIGH (ref 3.87–5.11)
RDW: 15.4 % (ref 11.5–15.5)
WBC: 5.6 10*3/uL (ref 4.0–10.5)
nRBC: 0 % (ref 0.0–0.2)

## 2020-05-20 LAB — IRON AND TIBC
Iron: 138 ug/dL (ref 41–142)
Saturation Ratios: 51 % (ref 21–57)
TIBC: 273 ug/dL (ref 236–444)
UIBC: 135 ug/dL (ref 120–384)

## 2020-05-20 LAB — FERRITIN: Ferritin: 622 ng/mL — ABNORMAL HIGH (ref 11–307)

## 2020-05-20 NOTE — Telephone Encounter (Signed)
Called and given below message. She verbalized understanding. 

## 2020-05-20 NOTE — Telephone Encounter (Signed)
-----   Message from [name], MD sent at 05/20/2020 12:11 PM EDT ----- Regarding: call Pls call her Ferritin is now very high I suggest cancelling all her appt on 9/10 Keep labs only on 9/27. We will call her with results

## 2020-05-20 NOTE — Telephone Encounter (Signed)
She called and left a message asking for lab appt be added back on 9/10. She is concerned about ferritn being so high after earlier call.  Called back. Per Dr. Bertis Ruddy, the higher number just means that we do not need to check the lab until next scheduled appt on 9/27. The higher number would be just extra storage. Dr. Bertis Ruddy said no to the earlier lab appt.  She became upset to not getting the earlier appt. She is concerned that the lab person made a mistake. Repeated above again.  She said she will get the lab somewhere and she can go above Dr. Bertis Ruddy to get the lab. Above message given to Dr. Bertis Ruddy.  Mailed out resulted labs per her request.

## 2020-05-21 ENCOUNTER — Encounter: Payer: Self-pay | Admitting: General Practice

## 2020-05-21 NOTE — Progress Notes (Signed)
[facility] Spiritual Care Note  Followed up with Melanie [name] by phone as planned. She shared some historical reasons why she is wary of medical and other establishments, using the opportunity to share and process several stories. She also notes that she prefers not to discuss her care or situation further, so I will follow her lead if she would like another pastoral encounter.   [address] [place], [place], [facility] Pager [phone] Voicemail [phone]

## 2020-06-21 ENCOUNTER — Ambulatory Visit: Payer: Medicare Other | Admitting: Hematology and Oncology

## 2020-06-21 ENCOUNTER — Other Ambulatory Visit: Payer: Medicare Other

## 2020-06-21 ENCOUNTER — Ambulatory Visit: Payer: Medicare Other

## 2020-06-21 ENCOUNTER — Telehealth: Payer: Self-pay | Admitting: Hematology and Oncology

## 2020-06-21 NOTE — Telephone Encounter (Signed)
Release: [id number] Faxed medical reocrds to [facility] @ fax#[phone]

## 2020-07-08 ENCOUNTER — Inpatient Hospital Stay: Payer: Medicare Other | Attending: Hematology and Oncology

## 2020-07-08 ENCOUNTER — Telehealth: Payer: Self-pay

## 2020-07-08 ENCOUNTER — Other Ambulatory Visit: Payer: Self-pay

## 2020-07-08 DIAGNOSIS — K5520 Angiodysplasia of colon without hemorrhage: Secondary | ICD-10-CM | POA: Diagnosis not present

## 2020-07-08 DIAGNOSIS — D509 Iron deficiency anemia, unspecified: Secondary | ICD-10-CM | POA: Diagnosis present

## 2020-07-08 DIAGNOSIS — D500 Iron deficiency anemia secondary to blood loss (chronic): Secondary | ICD-10-CM

## 2020-07-08 LAB — CBC WITH DIFFERENTIAL/PLATELET
Abs Immature Granulocytes: 0 10*3/uL (ref 0.00–0.07)
Basophils Absolute: 0 10*3/uL (ref 0.0–0.1)
Basophils Relative: 1 %
Eosinophils Absolute: 0.1 10*3/uL (ref 0.0–0.5)
Eosinophils Relative: 3 %
HCT: 41.3 % (ref 36.0–46.0)
Hemoglobin: 13.6 g/dL (ref 12.0–15.0)
Immature Granulocytes: 0 %
Lymphocytes Relative: 18 %
Lymphs Abs: 0.8 10*3/uL (ref 0.7–4.0)
MCH: 29.5 pg (ref 26.0–34.0)
MCHC: 32.9 g/dL (ref 30.0–36.0)
MCV: 89.6 fL (ref 80.0–100.0)
Monocytes Absolute: 0.3 10*3/uL (ref 0.1–1.0)
Monocytes Relative: 8 %
Neutro Abs: 3.1 10*3/uL (ref 1.7–7.7)
Neutrophils Relative %: 70 %
Platelets: 260 10*3/uL (ref 150–400)
RBC: 4.61 MIL/uL (ref 3.87–5.11)
RDW: 14.2 % (ref 11.5–15.5)
WBC: 4.3 10*3/uL (ref 4.0–10.5)
nRBC: 0 % (ref 0.0–0.2)

## 2020-07-08 LAB — FERRITIN: Ferritin: 268 ng/mL (ref 11–307)

## 2020-07-08 LAB — IRON AND TIBC
Iron: 100 ug/dL (ref 41–142)
Saturation Ratios: 40 % (ref 21–57)
TIBC: 250 ug/dL (ref 236–444)
UIBC: 150 ug/dL (ref 120–384)

## 2020-07-08 NOTE — Telephone Encounter (Signed)
Called and given below message. She verbalized understanding. Appt for lab scheduled for 11/26 at 11 am. She is aware of the time.

## 2020-07-08 NOTE — Telephone Encounter (Signed)
-----   Message from [name], MD sent at 07/08/2020 12:15 PM EDT ----- Regarding: labs are good Her labs and iron studies are good I suggest repeat in 2 months, labs only, if she agrees, send scheduling msg

## 2020-09-06 ENCOUNTER — Other Ambulatory Visit: Payer: Self-pay | Admitting: *Deleted

## 2020-09-06 ENCOUNTER — Inpatient Hospital Stay: Payer: Medicare Other | Attending: Hematology and Oncology

## 2020-09-06 ENCOUNTER — Other Ambulatory Visit: Payer: Self-pay

## 2020-09-06 ENCOUNTER — Telehealth: Payer: Self-pay | Admitting: *Deleted

## 2020-09-06 ENCOUNTER — Telehealth: Payer: Self-pay | Admitting: Hematology and Oncology

## 2020-09-06 ENCOUNTER — Other Ambulatory Visit: Payer: Self-pay | Admitting: Hematology and Oncology

## 2020-09-06 DIAGNOSIS — D509 Iron deficiency anemia, unspecified: Secondary | ICD-10-CM | POA: Insufficient documentation

## 2020-09-06 DIAGNOSIS — D500 Iron deficiency anemia secondary to blood loss (chronic): Secondary | ICD-10-CM

## 2020-09-06 LAB — CBC WITH DIFFERENTIAL/PLATELET
Abs Immature Granulocytes: 0.01 10*3/uL (ref 0.00–0.07)
Basophils Absolute: 0 10*3/uL (ref 0.0–0.1)
Basophils Relative: 1 %
Eosinophils Absolute: 0.2 10*3/uL (ref 0.0–0.5)
Eosinophils Relative: 4 %
HCT: 43.6 % (ref 36.0–46.0)
Hemoglobin: 14.3 g/dL (ref 12.0–15.0)
Immature Granulocytes: 0 %
Lymphocytes Relative: 21 %
Lymphs Abs: 1.2 10*3/uL (ref 0.7–4.0)
MCH: 29.8 pg (ref 26.0–34.0)
MCHC: 32.8 g/dL (ref 30.0–36.0)
MCV: 90.8 fL (ref 80.0–100.0)
Monocytes Absolute: 0.5 10*3/uL (ref 0.1–1.0)
Monocytes Relative: 8 %
Neutro Abs: 3.8 10*3/uL (ref 1.7–7.7)
Neutrophils Relative %: 66 %
Platelets: 277 10*3/uL (ref 150–400)
RBC: 4.8 MIL/uL (ref 3.87–5.11)
RDW: 12.9 % (ref 11.5–15.5)
WBC: 5.8 10*3/uL (ref 4.0–10.5)
nRBC: 0 % (ref 0.0–0.2)

## 2020-09-06 LAB — FERRITIN: Ferritin: 184 ng/mL (ref 11–307)

## 2020-09-06 LAB — IRON AND TIBC
Iron: 101 ug/dL (ref 41–142)
Saturation Ratios: 36 % (ref 21–57)
TIBC: 283 ug/dL (ref 236–444)
UIBC: 182 ug/dL (ref 120–384)

## 2020-09-06 NOTE — Telephone Encounter (Signed)
Scheduled per sch msg. Called and left msg. Mailed printout  

## 2020-09-06 NOTE — Telephone Encounter (Signed)
Called pt with results of labs.  She would like a copy.  Will mail. Scheduling message sent for repeat labs in 2 mo.  She will have her cell phone if anyone needs to reach her since she will be away after tues next week.

## 2020-09-06 NOTE — Telephone Encounter (Signed)
-----   Message from [name], MD sent at 09/06/2020 12:53 PM EST ----- Regarding: pls call her, labs and iron studies are good. recheck in 2 months, scheduler will call for appt

## 2020-09-09 ENCOUNTER — Ambulatory Visit
Admission: RE | Admit: 2020-09-09 | Discharge: 2020-09-09 | Disposition: A | Discharge status: Home / Self Care | Payer: Medicare Other | Source: Ambulatory Visit | Attending: Internal Medicine | Admitting: Internal Medicine

## 2020-09-09 ENCOUNTER — Other Ambulatory Visit: Payer: Self-pay

## 2020-09-09 DIAGNOSIS — E2839 Other primary ovarian failure: Secondary | ICD-10-CM

## 2020-09-09 DIAGNOSIS — Z1231 Encounter for screening mammogram for malignant neoplasm of breast: Secondary | ICD-10-CM

## 2020-09-23 ENCOUNTER — Other Ambulatory Visit: Payer: Self-pay | Admitting: Hematology and Oncology

## 2020-10-16 ENCOUNTER — Telehealth: Payer: Self-pay

## 2020-10-16 NOTE — Telephone Encounter (Signed)
She called to verify next appt of 1/28 at 2:15 pm. She verbalized understanding.

## 2020-11-08 ENCOUNTER — Other Ambulatory Visit: Payer: Self-pay

## 2020-11-08 ENCOUNTER — Inpatient Hospital Stay: Payer: Medicare Other | Attending: Hematology and Oncology

## 2020-11-08 DIAGNOSIS — D500 Iron deficiency anemia secondary to blood loss (chronic): Secondary | ICD-10-CM

## 2020-11-08 DIAGNOSIS — D509 Iron deficiency anemia, unspecified: Secondary | ICD-10-CM | POA: Diagnosis not present

## 2020-11-08 LAB — CBC WITH DIFFERENTIAL/PLATELET
Abs Immature Granulocytes: 0.02 10*3/uL (ref 0.00–0.07)
Basophils Absolute: 0 10*3/uL (ref 0.0–0.1)
Basophils Relative: 0 %
Eosinophils Absolute: 0.3 10*3/uL (ref 0.0–0.5)
Eosinophils Relative: 5 %
HCT: 44.3 % (ref 36.0–46.0)
Hemoglobin: 14.2 g/dL (ref 12.0–15.0)
Immature Granulocytes: 0 %
Lymphocytes Relative: 18 %
Lymphs Abs: 1.3 10*3/uL (ref 0.7–4.0)
MCH: 29.3 pg (ref 26.0–34.0)
MCHC: 32.1 g/dL (ref 30.0–36.0)
MCV: 91.5 fL (ref 80.0–100.0)
Monocytes Absolute: 0.5 10*3/uL (ref 0.1–1.0)
Monocytes Relative: 7 %
Neutro Abs: 4.9 10*3/uL (ref 1.7–7.7)
Neutrophils Relative %: 70 %
Platelets: 295 10*3/uL (ref 150–400)
RBC: 4.84 MIL/uL (ref 3.87–5.11)
RDW: 12.8 % (ref 11.5–15.5)
WBC: 7 10*3/uL (ref 4.0–10.5)
nRBC: 0 % (ref 0.0–0.2)

## 2020-11-11 ENCOUNTER — Telehealth: Payer: Self-pay

## 2020-11-11 ENCOUNTER — Other Ambulatory Visit: Payer: Self-pay | Admitting: Hematology and Oncology

## 2020-11-11 LAB — IRON AND TIBC
Iron: 60 ug/dL (ref 41–142)
Saturation Ratios: 17 % — ABNORMAL LOW (ref 21–57)
TIBC: 349 ug/dL (ref 236–444)
UIBC: 289 ug/dL (ref 120–384)

## 2020-11-11 LAB — FERRITIN: Ferritin: 33 ng/mL (ref 11–307)

## 2020-11-11 NOTE — Telephone Encounter (Signed)
Called and given below message. She verbalized understanding. 

## 2020-11-12 ENCOUNTER — Telehealth: Payer: Self-pay | Admitting: Hematology and Oncology

## 2020-11-12 NOTE — Telephone Encounter (Signed)
Scheduled appt per 1/31 sch msg - pt is aware of apts .

## 2020-11-22 ENCOUNTER — Encounter: Payer: Self-pay | Admitting: Hematology and Oncology

## 2020-11-22 ENCOUNTER — Other Ambulatory Visit: Payer: Self-pay

## 2020-11-22 ENCOUNTER — Inpatient Hospital Stay (HOSPITAL_BASED_OUTPATIENT_CLINIC_OR_DEPARTMENT_OTHER): Payer: Medicare Other | Admitting: Hematology and Oncology

## 2020-11-22 ENCOUNTER — Inpatient Hospital Stay: Payer: Medicare Other | Attending: Hematology and Oncology

## 2020-11-22 ENCOUNTER — Telehealth: Payer: Self-pay | Admitting: Hematology and Oncology

## 2020-11-22 ENCOUNTER — Telehealth: Payer: Self-pay | Admitting: Oncology

## 2020-11-22 DIAGNOSIS — K909 Intestinal malabsorption, unspecified: Secondary | ICD-10-CM | POA: Diagnosis not present

## 2020-11-22 DIAGNOSIS — K5520 Angiodysplasia of colon without hemorrhage: Secondary | ICD-10-CM

## 2020-11-22 DIAGNOSIS — D500 Iron deficiency anemia secondary to blood loss (chronic): Secondary | ICD-10-CM | POA: Diagnosis not present

## 2020-11-22 DIAGNOSIS — D509 Iron deficiency anemia, unspecified: Secondary | ICD-10-CM | POA: Insufficient documentation

## 2020-11-22 MED ORDER — SODIUM CHLORIDE 0.9 % IV SOLN
510.0000 mg | Freq: Once | INTRAVENOUS | Status: AC
  Administered 2020-11-22: 510 mg via INTRAVENOUS
  Filled 2020-11-22: qty 510

## 2020-11-22 MED ORDER — ACETAMINOPHEN 325 MG PO TABS
ORAL_TABLET | ORAL | Status: AC
  Filled 2020-11-22: qty 2

## 2020-11-22 MED ORDER — SODIUM CHLORIDE 0.9 % IV SOLN
INTRAVENOUS | Status: DC
  Filled 2020-11-22: qty 250

## 2020-11-22 MED ORDER — ACETAMINOPHEN 325 MG PO TABS
650.0000 mg | ORAL_TABLET | Freq: Once | ORAL | Status: AC
  Administered 2020-11-22: 650 mg via ORAL

## 2020-11-22 NOTE — Assessment & Plan Note (Signed)
She had recent EGD performed on April 16, 2020  which confirmed signs of erosions/gastritis Biopsy was negative She will continue intravenous iron infusion to prevent severe iron deficiency anemia

## 2020-11-22 NOTE — Progress Notes (Signed)
[facility] OFFICE PROGRESS NOTE  [name], MD  ASSESSMENT & PLAN:  Iron deficiency anemia Her intravenous iron requirement has increased over the last year She received 6 doses of IV iron to stay out of trouble The most likely cause of her anemia is due to chronic blood loss/malabsorption syndrome. We discussed some of the risks, benefits, and alternatives of intravenous iron infusions. The patient is symptomatic from anemia and the iron level is critically low. She tolerated oral iron supplement poorly and desires to achieved higher levels of iron faster for adequate hematopoesis. Some of the side-effects to be expected including risks of infusion reactions, phlebitis, headaches, nausea and fatigue.  The patient is willing to proceed. Patient education material was dispensed.  Goal is to keep ferritin level greater than 50 and resolution of anemia I will bring her back again in 6 months for follow-up   Angiodysplasia of colon She had recent EGD performed on April 16, 2020  which confirmed signs of erosions/gastritis Biopsy was negative She will continue intravenous iron infusion to prevent severe iron deficiency anemia   No orders of the defined types were placed in this encounter.   The total time spent in the appointment was 20 minutes encounter with patients including review of chart and various tests results, discussions about plan of care and coordination of care plan   All questions were answered. The patient knows to call the clinic with any problems, questions or concerns. No barriers to learning was detected.    [name], MD 2/11/202212:54 PM  INTERVAL HISTORY: Melanie [name] 86 y.o. female returns for further follow-up on recurrent iron deficiency anemia She complain of some mild fatigue The patient denies any recent signs or symptoms of bleeding such as spontaneous epistaxis, hematuria or hematochezia.  SUMMARY OF HEMATOLOGIC HISTORY:  The patient  has been complaining of fatigue and was found to have severe iron deficiency anemia. Approximately a year or 2 ago, she received 2 units of blood transfusion because of this. She had extensive GI evaluation with EGD and colonoscopy and was never found to have any sort of GI bleed. She has received 4 intravenous doses of iron infusion and the last infusion in 3 months ago. Bone marrow aspirate and biopsy in January 2013 showed no evidence of malignancy or persistent absence of iron stores. In 2014, she received 6 doses of intravenous iron infusion. She was referred to gastroenterology for further evaluation due to suspected GI bleed. Repeat GI endoscopy dated 12/26/2013 showed angiodysplastic lesion and moderate diverticulosis. The patient had received numerous intravenous iron infusion almost on a monthly basis from 03/15/2013 to 01/17/15. Her last intravenous iron was in July 2016, and then resumed in 2018 On April 16, 2020, she had repeat upper endoscopy - Normal mucosa was found in the entire esophagus. - A 10 cm hiatal hernia with multiple Cameron erosions was found. The proximal extent of the gastric folds (end of tubular esophagus) was 30 cm from the incisors. The hiatal narrowing was 40 cm from the incisors. - Multiple erosions as above with adherent heme without active bleeding were found in the gastric body. Biopsies were taken with a cold forceps for histology and Helicobacter pylori testing. These are secondary to the large hiatal hernia. - Moderate inflammation characterized by erythema was found in the duodenal bulb. - The second portion of the duodenum was normal.  I have reviewed the past medical history, past surgical history, social history and family history with the  patient and they are unchanged from previous note.  ALLERGIES:  is allergic to cephalexin, clindamycin/lincomycin, codeine, levofloxacin, morphine and related, other, quinolones, sulfa antibiotics, tramadol, amoxicillin,  fexofenadine hcl, floxin [ocuflox], neurontin [gabapentin], and septra [bactrim].  MEDICATIONS:  Current Outpatient Medications  Medication Sig Dispense Refill  . aspirin 81 MG tablet Take 81 mg by mouth daily.     . cholecalciferol (VITAMIN D) 1000 units tablet Take 5,000 Units by mouth daily.    . Cyanocobalamin (VITAMIN B12 PO) Take 500 mcg by mouth daily.     . folic acid (FOLVITE) 1 MG tablet TAKE 1 TABLET BY MOUTH EVERY DAY 90 tablet 3  . metoprolol (LOPRESSOR) 50 MG tablet Take 1 tablet (50 mg total) by mouth 2 (two) times daily. 180 tablet 1  . pantoprazole (PROTONIX) 40 MG tablet Take 1 tablet (40 mg total) by mouth daily. 90 tablet 4  . polyethylene glycol powder (GLYCOLAX/MIRALAX) powder Take 17 g by mouth daily as needed. 1051 g 1  . UNABLE TO FIND Med Name:  [facility] Lidocaine Cream over the counter as needed for joint and muscle pain     No current facility-administered medications for this visit.     REVIEW OF SYSTEMS:   Constitutional: Denies fevers, chills or night sweats Eyes: Denies blurriness of vision Ears, nose, mouth, throat, and face: Denies mucositis or sore throat Respiratory: Denies cough, dyspnea or wheezes Cardiovascular: Denies palpitation, chest discomfort or lower extremity swelling Gastrointestinal:  Denies nausea, heartburn or change in bowel habits Skin: Denies abnormal skin rashes Lymphatics: Denies new lymphadenopathy or easy bruising Neurological:Denies numbness, tingling or new weaknesses Behavioral/Psych: Mood is stable, no new changes  All other systems were reviewed with the patient and are negative.  PHYSICAL EXAMINATION: ECOG PERFORMANCE STATUS: 0 - Asymptomatic  Vitals:   11/22/20 1224  BP: 139/83  Pulse: 67  Resp: 18  Temp: (!) 97.4 F (36.3 C)  SpO2: 98%   Filed Weights   11/22/20 1224  Weight: 185 lb (83.9 kg)    GENERAL:alert, no distress and comfortable NEURO: alert & oriented x 3 with fluent speech, no focal  motor/sensory deficits  LABORATORY DATA:  I have reviewed the data as listed     Component Value Date/Time   NA 140 11/02/2016 1403   NA 144 02/12/2014 0849   K 4.3 11/02/2016 1403   K 3.6 02/12/2014 0849   CL 106 11/02/2016 1403   CL 109 (H) 12/23/2012 1100   CO2 28 11/02/2016 1403   CO2 24 02/12/2014 0849   GLUCOSE 99 11/02/2016 1403   GLUCOSE 152 (H) 02/12/2014 0849   GLUCOSE 106 (H) 12/23/2012 1100   BUN 18 11/02/2016 1403   BUN 15.4 02/12/2014 0849   CREATININE 0.75 11/02/2016 1403   CREATININE 0.8 02/12/2014 0849   CALCIUM 10.6 (H) 11/02/2016 1403   CALCIUM 10.7 (H) 02/12/2014 0849   PROT 7.1 08/22/2015 1041   PROT 6.3 (L) 02/12/2014 0849   ALBUMIN 4.1 08/22/2015 1041   ALBUMIN 3.4 (L) 02/12/2014 0849   AST 17 08/22/2015 1041   AST 13 02/12/2014 0849   ALT 16 08/22/2015 1041   ALT 8 02/12/2014 0849   ALKPHOS 89 08/22/2015 1041   ALKPHOS 78 02/12/2014 0849   BILITOT 0.5 08/22/2015 1041   BILITOT 0.25 02/12/2014 0849   GFRNONAA 80 (L) 08/22/2011 1205   GFRAA >90 08/22/2011 1205    No results found for: SPEP, UPEP  Lab Results  Component Value Date   WBC 7.0  11/08/2020   NEUTROABS 4.9 11/08/2020   HGB 14.2 11/08/2020   HCT 44.3 11/08/2020   MCV 91.5 11/08/2020   PLT 295 11/08/2020      Chemistry      Component Value Date/Time   NA 140 11/02/2016 1403   NA 144 02/12/2014 0849   K 4.3 11/02/2016 1403   K 3.6 02/12/2014 0849   CL 106 11/02/2016 1403   CL 109 (H) 12/23/2012 1100   CO2 28 11/02/2016 1403   CO2 24 02/12/2014 0849   BUN 18 11/02/2016 1403   BUN 15.4 02/12/2014 0849   CREATININE 0.75 11/02/2016 1403   CREATININE 0.8 02/12/2014 0849      Component Value Date/Time   CALCIUM 10.6 (H) 11/02/2016 1403   CALCIUM 10.7 (H) 02/12/2014 0849   ALKPHOS 89 08/22/2015 1041   ALKPHOS 78 02/12/2014 0849   AST 17 08/22/2015 1041   AST 13 02/12/2014 0849   ALT 16 08/22/2015 1041   ALT 8 02/12/2014 0849   BILITOT 0.5 08/22/2015 1041   BILITOT  0.25 02/12/2014 0849

## 2020-11-22 NOTE — Patient Instructions (Signed)
Ferumoxytol injection What is this medicine? FERUMOXYTOL is an iron complex. Iron is used to make healthy red blood cells, which carry oxygen and nutrients throughout the body. This medicine is used to treat iron deficiency anemia. This medicine may be used for other purposes; ask your health care provider or pharmacist if you have questions. COMMON BRAND NAME(S): Feraheme What should I tell my health care provider before I take this medicine? They need to know if you have any of these conditions:  anemia not caused by low iron levels  high levels of iron in the blood  magnetic resonance imaging (MRI) test scheduled  an unusual or allergic reaction to iron, other medicines, foods, dyes, or preservatives  pregnant or trying to get pregnant  breast-feeding How should I use this medicine? This medicine is for injection into a vein. It is given by a health care professional in a hospital or clinic setting. Talk to your pediatrician regarding the use of this medicine in children. Special care may be needed. Overdosage: If you think you have taken too much of this medicine contact a poison control center or emergency room at once. NOTE: This medicine is only for you. Do not share this medicine with others. What if I miss a dose? It is important not to miss your dose. Call your doctor or health care professional if you are unable to keep an appointment. What may interact with this medicine? This medicine may interact with the following medications:  other iron products This list may not describe all possible interactions. Give your health care provider a list of all the medicines, herbs, non-prescription drugs, or dietary supplements you use. Also tell them if you smoke, drink alcohol, or use illegal drugs. Some items may interact with your medicine. What should I watch for while using this medicine? Visit your doctor or healthcare professional regularly. Tell your doctor or healthcare  professional if your symptoms do not start to get better or if they get worse. You may need blood work done while you are taking this medicine. You may need to follow a special diet. Talk to your doctor. Foods that contain iron include: whole grains/cereals, dried fruits, beans, or peas, leafy green vegetables, and organ meats (liver, kidney). What side effects may I notice from receiving this medicine? Side effects that you should report to your doctor or health care professional as soon as possible:  allergic reactions like skin rash, itching or hives, swelling of the face, lips, or tongue  breathing problems  changes in blood pressure  feeling faint or lightheaded, falls  fever or chills  flushing, sweating, or hot feelings  swelling of the ankles or feet Side effects that usually do not require medical attention (report to your doctor or health care professional if they continue or are bothersome):  diarrhea  headache  nausea, vomiting  stomach pain This list may not describe all possible side effects. Call your doctor for medical advice about side effects. You may report side effects to FDA at 1-800-FDA-1088. Where should I keep my medicine? This drug is given in a hospital or clinic and will not be stored at home. NOTE: This sheet is a summary. It may not cover all possible information. If you have questions about this medicine, talk to your doctor, pharmacist, or health care provider.  2021 Elsevier/Gold Standard (2016-11-16 20:21:10)  

## 2020-11-22 NOTE — Telephone Encounter (Signed)
Scheduled appts per 2/11 sch msg. Gave pt a print out of AVS.  

## 2020-11-22 NOTE — Assessment & Plan Note (Signed)
Her intravenous iron requirement has increased over the last year She received 6 doses of IV iron to stay out of trouble The most likely cause of her anemia is due to chronic blood loss/malabsorption syndrome. We discussed some of the risks, benefits, and alternatives of intravenous iron infusions. The patient is symptomatic from anemia and the iron level is critically low. She tolerated oral iron supplement poorly and desires to achieved higher levels of iron faster for adequate hematopoesis. Some of the side-effects to be expected including risks of infusion reactions, phlebitis, headaches, nausea and fatigue.  The patient is willing to proceed. Patient education material was dispensed.  Goal is to keep ferritin level greater than 50 and resolution of anemia I will bring her back again in 6 months for follow-up

## 2020-11-29 ENCOUNTER — Inpatient Hospital Stay: Payer: Medicare Other

## 2020-11-29 ENCOUNTER — Other Ambulatory Visit: Payer: Self-pay

## 2020-11-29 VITALS — BP 116/57 | HR 65 | Temp 97.9°F | Resp 20

## 2020-11-29 DIAGNOSIS — D509 Iron deficiency anemia, unspecified: Secondary | ICD-10-CM | POA: Diagnosis not present

## 2020-11-29 DIAGNOSIS — K5520 Angiodysplasia of colon without hemorrhage: Secondary | ICD-10-CM

## 2020-11-29 MED ORDER — ACETAMINOPHEN 325 MG PO TABS
ORAL_TABLET | ORAL | Status: AC
  Filled 2020-11-29: qty 2

## 2020-11-29 MED ORDER — ACETAMINOPHEN 325 MG PO TABS
650.0000 mg | ORAL_TABLET | Freq: Once | ORAL | Status: AC
  Administered 2020-11-29: 650 mg via ORAL

## 2020-11-29 MED ORDER — SODIUM CHLORIDE 0.9 % IV SOLN
Freq: Once | INTRAVENOUS | Status: AC
  Filled 2020-11-29: qty 250

## 2020-11-29 MED ORDER — SODIUM CHLORIDE 0.9 % IV SOLN
510.0000 mg | Freq: Once | INTRAVENOUS | Status: AC
  Administered 2020-11-29: 510 mg via INTRAVENOUS
  Filled 2020-11-29: qty 510

## 2020-11-29 NOTE — Patient Instructions (Signed)
Ferumoxytol injection What is this medicine? FERUMOXYTOL is an iron complex. Iron is used to make healthy red blood cells, which carry oxygen and nutrients throughout the body. This medicine is used to treat iron deficiency anemia. This medicine may be used for other purposes; ask your health care provider or pharmacist if you have questions. COMMON BRAND NAME(S): Feraheme What should I tell my health care provider before I take this medicine? They need to know if you have any of these conditions:  anemia not caused by low iron levels  high levels of iron in the blood  magnetic resonance imaging (MRI) test scheduled  an unusual or allergic reaction to iron, other medicines, foods, dyes, or preservatives  pregnant or trying to get pregnant  breast-feeding How should I use this medicine? This medicine is for injection into a vein. It is given by a health care professional in a hospital or clinic setting. Talk to your pediatrician regarding the use of this medicine in children. Special care may be needed. Overdosage: If you think you have taken too much of this medicine contact a poison control center or emergency room at once. NOTE: This medicine is only for you. Do not share this medicine with others. What if I miss a dose? It is important not to miss your dose. Call your doctor or health care professional if you are unable to keep an appointment. What may interact with this medicine? This medicine may interact with the following medications:  other iron products This list may not describe all possible interactions. Give your health care provider a list of all the medicines, herbs, non-prescription drugs, or dietary supplements you use. Also tell them if you smoke, drink alcohol, or use illegal drugs. Some items may interact with your medicine. What should I watch for while using this medicine? Visit your doctor or healthcare professional regularly. Tell your doctor or healthcare  professional if your symptoms do not start to get better or if they get worse. You may need blood work done while you are taking this medicine. You may need to follow a special diet. Talk to your doctor. Foods that contain iron include: whole grains/cereals, dried fruits, beans, or peas, leafy green vegetables, and organ meats (liver, kidney). What side effects may I notice from receiving this medicine? Side effects that you should report to your doctor or health care professional as soon as possible:  allergic reactions like skin rash, itching or hives, swelling of the face, lips, or tongue  breathing problems  changes in blood pressure  feeling faint or lightheaded, falls  fever or chills  flushing, sweating, or hot feelings  swelling of the ankles or feet Side effects that usually do not require medical attention (report to your doctor or health care professional if they continue or are bothersome):  diarrhea  headache  nausea, vomiting  stomach pain This list may not describe all possible side effects. Call your doctor for medical advice about side effects. You may report side effects to FDA at 1-800-FDA-1088. Where should I keep my medicine? This drug is given in a hospital or clinic and will not be stored at home. NOTE: This sheet is a summary. It may not cover all possible information. If you have questions about this medicine, talk to your doctor, pharmacist, or health care provider.  2021 Elsevier/Gold Standard (2016-11-16 20:21:10)  

## 2020-12-16 ENCOUNTER — Telehealth: Payer: Self-pay | Admitting: Internal Medicine

## 2020-12-16 NOTE — Telephone Encounter (Signed)
Over the weekend pt states she saw BRB in her diaper, states she wears the diaper due to urinary issues. She is not sure exactly where the bleeding is coming from, states it is somewhere between her "lady parts" and her rectum. Reports she can feel a lump in the area and states she knows it is not a hemorrhoid. Offered to schedule pt an appt with Mike Gip PA and pt states she will not see her. Pt scheduled to see Doug Sou PA tomorrow at [facility]. Pt aware of appt.

## 2020-12-16 NOTE — Telephone Encounter (Signed)
Inbound call from patient requesting a call back please.  States she had some bleeding this Saturday and wants to know what to do.  Please advise.

## 2020-12-17 ENCOUNTER — Telehealth: Payer: Self-pay | Admitting: Gastroenterology

## 2020-12-17 ENCOUNTER — Ambulatory Visit (INDEPENDENT_AMBULATORY_CARE_PROVIDER_SITE_OTHER): Payer: Medicare Other | Admitting: Gastroenterology

## 2020-12-17 ENCOUNTER — Encounter: Payer: Self-pay | Admitting: Gastroenterology

## 2020-12-17 VITALS — BP 132/76 | HR 64 | Ht 62.0 in | Wt 181.1 lb

## 2020-12-17 DIAGNOSIS — K644 Residual hemorrhoidal skin tags: Secondary | ICD-10-CM | POA: Diagnosis not present

## 2020-12-17 DIAGNOSIS — R634 Abnormal weight loss: Secondary | ICD-10-CM

## 2020-12-17 DIAGNOSIS — K6289 Other specified diseases of anus and rectum: Secondary | ICD-10-CM

## 2020-12-17 DIAGNOSIS — K449 Diaphragmatic hernia without obstruction or gangrene: Secondary | ICD-10-CM

## 2020-12-17 DIAGNOSIS — R1013 Epigastric pain: Secondary | ICD-10-CM | POA: Diagnosis not present

## 2020-12-17 DIAGNOSIS — K625 Hemorrhage of anus and rectum: Secondary | ICD-10-CM

## 2020-12-17 MED ORDER — HYDROCORTISONE (PERIANAL) 2.5 % EX CREA: 1.0000 "application " | TOPICAL_CREAM | Freq: Two times a day (BID) | CUTANEOUS | 1 refills | Status: DC

## 2020-12-17 MED ORDER — OMEPRAZOLE 40 MG PO CPDR: 40.0000 mg | DELAYED_RELEASE_CAPSULE | Freq: Every day | ORAL | 5 refills | Status: DC

## 2020-12-17 NOTE — Patient Instructions (Addendum)
If you are age 84 or older, your body mass index should be between 23-30. Your Body mass index is 33.13 kg/m. If this is out of the aforementioned range listed, please consider follow up with your Primary Care Provider.  If you are age 51 or younger, your body mass index should be between 19-25. Your Body mass index is 33.13 kg/m. If this is out of the aformentioned range listed, please consider follow up with your Primary Care Provider.   We have sent the following medications to your pharmacy for you to pick up at your convenience: Hydrocortisone cream 2.5% twice daily to external hemorrhoid. Omeprazole 40 mg daily.   IMAGING:  . You will be contacted by [facility] Scheduling (Your caller ID will indicate phone # [phone]) within the next business 2 days to schedule your CT abdomen & pelvis. If you have not heard from them within 2 business days, please call [facility] Scheduling at [phone] to follow up on the status of your appointment.

## 2020-12-17 NOTE — Progress Notes (Signed)
12/17/2020 Melanie [name] [id number] Mar 29, 1935   HISTORY OF PRESENT ILLNESS: This is an 86 year old female who is a patient of Dr. Lauro Franklin.  She is here today with multiple complaints.  She reports an episode of bright red blood on the toilet paper and in the toilet bowl with one little clot.  She describes feeling something up inside on the left of her rectum and describes feeling a ridge or pulling on the left side of her vagina.  She reports that she has had some constipation, but is taking MiraLAX for that.  She has noticed that her stools seem somewhat smaller in diameter.  While she is here she also complains of epigastric abdominal pain.  She has lost about 13 pounds according to the system since April 2021.  She is not currently on PPI.  She says that a couple of them made her arms hurt previously.  She called back and said that pantoprazole and omeprazole were the ones that made her arms hurt.  She had an EGD with Dr. Rhea Belton in July 2021 that showed a 10 cm hiatal hernia with multiple Cameron's erosions and also some duodenitis.  Small bowel biopsies were normal, negative for celiac disease.  Gastric biopsies were negative for H. Pylori.  Last colonoscopy was by Dr. Rhea Belton in March 2015 at which time she was found to have an angiodysplastic colonic lesion to which epinephrine and APC were applied.  She also had diverticulosis.    Past Medical History:  Diagnosis Date  . Allergy   . Anemia   . Arthritis    right hip is most painful and affects mobility as far as ambulating steps  . AVM (arteriovenous malformation)    of cecum  . Cataract   . Complication of anesthesia    after gallbladder surgery, had memory issues 2 days after for several days  . Diverticula of colon   . Duodenal ulcer 2015   x 4 nonbleeding  . Duodenitis   . Hearing loss   . Hiatal hernia   . Hypercalcemia 08/18/2013  . Hypertension   . Iron deficiency anemia, unspecified 12/25/2012   Past  Surgical History:  Procedure Laterality Date  . CATARACT EXTRACTION Bilateral    2008  . CHOLECYSTECTOMY     laparoscopic   . COLONOSCOPY N/A 12/26/2013   Procedure: COLONOSCOPY;  Surgeon: Beverley Fiedler, MD;  Location: [facility];  Service: Gastroenterology;  Laterality: N/A;  . ESOPHAGOGASTRODUODENOSCOPY N/A 12/26/2013   Procedure: ESOPHAGOGASTRODUODENOSCOPY (EGD);  Surgeon: Beverley Fiedler, MD;  Location: [facility];  Service: Gastroenterology;  Laterality: N/A;  . GIVENS CAPSULE STUDY N/A 12/26/2013   Procedure: GIVENS CAPSULE STUDY;  Surgeon: Beverley Fiedler, MD;  Location: [facility];  Service: Gastroenterology;  Laterality: N/A;  Due to inablility to swallow capsule, will place Endoscopically into Small Bowel.  Marland Kitchen HOT HEMOSTASIS N/A 12/26/2013   Procedure: HOT HEMOSTASIS (ARGON PLASMA COAGULATION/BICAP);  Surgeon: Beverley Fiedler, MD;  Location: [facility];  Service: Gastroenterology;  Laterality: N/A;  . TONSILLECTOMY      reports that she has never smoked. She has never used smokeless tobacco. She reports that she does not drink alcohol and does not use drugs. family history includes Asthma in an other family member; Breast cancer (age of onset: 77) in her daughter; Diabetes in her mother; Diverticulosis in her mother; Heart disease in her father and mother; Hyperlipidemia in an other family member; Hypertension in an other family member; Stroke in an  other family member. Allergies  Allergen Reactions  . Cephalexin Other (See Comments)    Affected pt's child -  Therefore , pt does not take Keflex.  . Clindamycin/Lincomycin   . Codeine Nausea And Vomiting  . Levofloxacin Other (See Comments)    Hallucinations.  . Morphine And Related Nausea And Vomiting  . Other     Another antibiotic that starts with cina?  . Quinolones Other (See Comments)    Hallucinations.  . Sulfa Antibiotics Nausea And Vomiting  . Tramadol Nausea And Vomiting  . Amoxicillin Nausea Only  . Fexofenadine Hcl  Other (See Comments)    unknown reaction  . Floxin [Ocuflox] Other (See Comments)    unknown allergy  . Neurontin [Gabapentin] Palpitations  . Septra [Bactrim] Other (See Comments)    Unknown reaction      Outpatient Encounter Medications as of 12/17/2020  Medication Sig  . aspirin 81 MG tablet Take 81 mg by mouth daily.   . cholecalciferol (VITAMIN D) 1000 units tablet Take 5,000 Units by mouth daily.  . Cyanocobalamin (VITAMIN B12 PO) Take 500 mcg by mouth daily.   . folic acid (FOLVITE) 1 MG tablet TAKE 1 TABLET BY MOUTH EVERY DAY  . metoprolol (LOPRESSOR) 50 MG tablet Take 1 tablet (50 mg total) by mouth 2 (two) times daily.  . pantoprazole (PROTONIX) 40 MG tablet Take 1 tablet (40 mg total) by mouth daily.  . polyethylene glycol powder (GLYCOLAX/MIRALAX) powder Take 17 g by mouth daily as needed.  . [DISCONTINUED] UNABLE TO FIND Med Name:  Icy Hot Lidocaine Cream over the counter as needed for joint and muscle pain   No facility-administered encounter medications on file as of 12/17/2020.     REVIEW OF SYSTEMS  : All other systems reviewed and negative except where noted in the History of Present Illness.   PHYSICAL EXAM: BP 132/76 (BP Location: Left Arm, Patient Position: Sitting, Cuff Size: Normal)   Pulse 64   Ht 5\' 2"  (1.575 m) Comment: height measured without shoes  Wt 181 lb 2 oz (82.2 kg)   BMI 33.13 kg/m  General: Well developed white female in no acute distress; difficult to keep on topic at times. Head: Normocephalic and atraumatic Eyes:  Sclerae anicteric, conjunctiva pink. Ears: Normal auditory acuity. Lungs: Clear throughout to auscultation; no W/R/R. Heart: Regular rate and rhythm; no M/R/G. Abdomen: Soft, non-distended.  BS present.  Mild epigastric TTP. Rectal:  Inflamed external hemorrhoids noted.  DRE did not reveal any masses. Musculoskeletal: Symmetrical with no gross deformities  Skin: No lesions on visible extremities Extremities: No edema   Neurological: Alert oriented x 4, grossly non-focal Psychological:  Alert and cooperative. Normal mood and affect  ASSESSMENT AND PLAN: *86 year old female with complaints of rectal pain/discomfort and bleeding: She had external hemorrhoids seen on exam today that seemed quite inflamed.  DRE did not reveal any masses.  We will treat with hydrocortisone cream twice daily.  Prescription sent to pharmacy.  She also describes a ridge or pulling that she feels goes from her rectum to her vagina.  In light of her weight loss that she has had as well we will plan for a CT scan of the abdomen and pelvis with contrast to rule out any other internal issues. *Epigastric abdominal pain and GERD with a known 10 cm hiatal hernia from EGD in July 2021.  Is not currently on PPI therapy because she said that previously pantoprazole (and later reported also omeprazole) made her arms hurt.  We will try one more PPI with Prevacid 30 mg daily.  Prescription sent to pharmacy. *Weight loss: She is down 13 pounds since April 2021.   CC:  [name], MD

## 2020-12-18 NOTE — Telephone Encounter (Signed)
Then prevacid 30 mg daily is fine.

## 2020-12-18 NOTE — Telephone Encounter (Signed)
Patient states both Pantoprazole and Omeprazole makes her arm hurt. She states she can take Prevacid.

## 2020-12-19 MED ORDER — LANSOPRAZOLE 30 MG PO CPDR
30.0000 mg | DELAYED_RELEASE_CAPSULE | Freq: Every day | ORAL | 5 refills | Status: DC
Start: 2020-12-19 — End: 2021-02-25

## 2020-12-19 NOTE — Telephone Encounter (Signed)
Script sent to pharmacy.

## 2020-12-26 ENCOUNTER — Ambulatory Visit (HOSPITAL_COMMUNITY)
Admission: RE | Admit: 2020-12-26 | Discharge: 2020-12-26 | Disposition: A | Discharge status: Home / Self Care | Payer: Medicare Other | Source: Ambulatory Visit | Attending: Gastroenterology | Admitting: Gastroenterology

## 2020-12-26 ENCOUNTER — Encounter (HOSPITAL_COMMUNITY): Payer: Self-pay

## 2020-12-26 ENCOUNTER — Other Ambulatory Visit: Payer: Self-pay

## 2020-12-26 DIAGNOSIS — K644 Residual hemorrhoidal skin tags: Secondary | ICD-10-CM | POA: Insufficient documentation

## 2020-12-26 DIAGNOSIS — R634 Abnormal weight loss: Secondary | ICD-10-CM | POA: Insufficient documentation

## 2020-12-26 DIAGNOSIS — R1013 Epigastric pain: Secondary | ICD-10-CM | POA: Diagnosis present

## 2020-12-26 DIAGNOSIS — K6289 Other specified diseases of anus and rectum: Secondary | ICD-10-CM | POA: Insufficient documentation

## 2020-12-26 DIAGNOSIS — K625 Hemorrhage of anus and rectum: Secondary | ICD-10-CM | POA: Insufficient documentation

## 2020-12-26 DIAGNOSIS — K449 Diaphragmatic hernia without obstruction or gangrene: Secondary | ICD-10-CM | POA: Insufficient documentation

## 2020-12-26 LAB — POCT I-STAT CREATININE: Creatinine, Ser: 0.8 mg/dL (ref 0.44–1.00)

## 2020-12-26 MED ORDER — IOHEXOL 300 MG/ML  SOLN
100.0000 mL | Freq: Once | INTRAMUSCULAR | Status: AC | PRN
  Administered 2020-12-26: 100 mL via INTRAVENOUS

## 2020-12-30 ENCOUNTER — Other Ambulatory Visit: Payer: Self-pay

## 2020-12-30 DIAGNOSIS — R1013 Epigastric pain: Secondary | ICD-10-CM

## 2020-12-30 DIAGNOSIS — R634 Abnormal weight loss: Secondary | ICD-10-CM

## 2021-01-02 NOTE — Progress Notes (Signed)
Addendum: Reviewed and agree with assessment and management plan. Pyrtle, Jay M, MD  

## 2021-01-22 ENCOUNTER — Other Ambulatory Visit: Payer: Self-pay

## 2021-01-22 ENCOUNTER — Ambulatory Visit (AMBULATORY_SURGERY_CENTER): Payer: Self-pay

## 2021-01-22 ENCOUNTER — Telehealth: Payer: Self-pay

## 2021-01-22 VITALS — Ht 62.0 in | Wt 181.0 lb

## 2021-01-22 DIAGNOSIS — K639 Disease of intestine, unspecified: Secondary | ICD-10-CM

## 2021-01-22 DIAGNOSIS — K2289 Other specified disease of esophagus: Secondary | ICD-10-CM

## 2021-01-22 NOTE — Progress Notes (Signed)
No egg or soy allergy known to patient  Patient denies ever being told they had issues or difficulty with intubation  No FH of Malignant Hyperthermia No diet pills per patient No home 02 use per patient  No blood thinners per patient  No A fib or A flutter  EMMI video to pt or via MyChart  COVID 19 guidelines implemented in PV today with Pt and RN  Pt is fully vaccinated  for Covid   Due to the COVID-19 pandemic we are asking patients to follow certain guidelines.  Pt aware of COVID protocols and [facility] guidelines

## 2021-01-22 NOTE — Telephone Encounter (Signed)
During the PV for upcoming flex sig and EGD on 5/17 @ 2 pm, today patient informed RN that "I can't and won't take the mag stuff (magnesium citrate), I'll throw up and it doesn't work"  Patient reports being able to take dulcolax and miralax.  Hx of constipation, please advise what prep instructions you would like to be given.

## 2021-01-22 NOTE — Telephone Encounter (Signed)
New procedure instructions printed.  Called patient to go over new instructions and she was still in [employer] waiting for a bus.  RN went down to lobby and reviewed new instructions with the patient.  Patient required much redirection to provide thorough instructions.  Patient verbalized understanding.

## 2021-01-22 NOTE — Telephone Encounter (Signed)
I would advise a 1/2 MiraLax prep and 2 enemas for the flex sig prep Thanks JMP

## 2021-02-25 ENCOUNTER — Encounter: Payer: Self-pay | Admitting: Internal Medicine

## 2021-02-25 ENCOUNTER — Other Ambulatory Visit: Payer: Self-pay

## 2021-02-25 ENCOUNTER — Ambulatory Visit (AMBULATORY_SURGERY_CENTER): Payer: Medicare Other | Admitting: Internal Medicine

## 2021-02-25 ENCOUNTER — Other Ambulatory Visit: Payer: Self-pay | Admitting: *Deleted

## 2021-02-25 ENCOUNTER — Other Ambulatory Visit (INDEPENDENT_AMBULATORY_CARE_PROVIDER_SITE_OTHER): Payer: Medicare Other

## 2021-02-25 VITALS — BP 95/63 | HR 64 | Temp 96.6°F | Resp 14 | Ht 62.0 in | Wt 181.0 lb

## 2021-02-25 DIAGNOSIS — K219 Gastro-esophageal reflux disease without esophagitis: Secondary | ICD-10-CM

## 2021-02-25 DIAGNOSIS — K5730 Diverticulosis of large intestine without perforation or abscess without bleeding: Secondary | ICD-10-CM | POA: Diagnosis not present

## 2021-02-25 DIAGNOSIS — K2970 Gastritis, unspecified, without bleeding: Secondary | ICD-10-CM

## 2021-02-25 DIAGNOSIS — K639 Disease of intestine, unspecified: Secondary | ICD-10-CM

## 2021-02-25 DIAGNOSIS — K449 Diaphragmatic hernia without obstruction or gangrene: Secondary | ICD-10-CM

## 2021-02-25 DIAGNOSIS — K319 Disease of stomach and duodenum, unspecified: Secondary | ICD-10-CM | POA: Diagnosis not present

## 2021-02-25 DIAGNOSIS — K6289 Other specified diseases of anus and rectum: Secondary | ICD-10-CM | POA: Diagnosis not present

## 2021-02-25 DIAGNOSIS — D500 Iron deficiency anemia secondary to blood loss (chronic): Secondary | ICD-10-CM | POA: Diagnosis not present

## 2021-02-25 DIAGNOSIS — R935 Abnormal findings on diagnostic imaging of other abdominal regions, including retroperitoneum: Secondary | ICD-10-CM

## 2021-02-25 DIAGNOSIS — K2289 Other specified disease of esophagus: Secondary | ICD-10-CM

## 2021-02-25 DIAGNOSIS — K269 Duodenal ulcer, unspecified as acute or chronic, without hemorrhage or perforation: Secondary | ICD-10-CM | POA: Diagnosis not present

## 2021-02-25 DIAGNOSIS — K648 Other hemorrhoids: Secondary | ICD-10-CM | POA: Diagnosis not present

## 2021-02-25 DIAGNOSIS — R933 Abnormal findings on diagnostic imaging of other parts of digestive tract: Secondary | ICD-10-CM

## 2021-02-25 LAB — CBC WITH DIFFERENTIAL/PLATELET
Basophils Absolute: 0 10*3/uL (ref 0.0–0.1)
Basophils Relative: 0.6 % (ref 0.0–3.0)
Eosinophils Absolute: 0.1 10*3/uL (ref 0.0–0.7)
Eosinophils Relative: 1.7 % (ref 0.0–5.0)
HCT: 42.4 % (ref 36.0–46.0)
Hemoglobin: 14.3 g/dL (ref 12.0–15.0)
Lymphocytes Relative: 16.1 % (ref 12.0–46.0)
Lymphs Abs: 0.9 10*3/uL (ref 0.7–4.0)
MCHC: 33.8 g/dL (ref 30.0–36.0)
MCV: 88 fl (ref 78.0–100.0)
Monocytes Absolute: 0.3 10*3/uL (ref 0.1–1.0)
Monocytes Relative: 5.2 % (ref 3.0–12.0)
Neutro Abs: 4.3 10*3/uL (ref 1.4–7.7)
Neutrophils Relative %: 76.4 % (ref 43.0–77.0)
Platelets: 262 10*3/uL (ref 150.0–400.0)
RBC: 4.82 Mil/uL (ref 3.87–5.11)
RDW: 14.5 % (ref 11.5–15.5)
WBC: 5.6 10*3/uL (ref 4.0–10.5)

## 2021-02-25 LAB — IBC + FERRITIN
Ferritin: 245.1 ng/mL (ref 10.0–291.0)
Iron: 67 ug/dL (ref 42–145)
Saturation Ratios: 24 % (ref 20.0–50.0)
Transferrin: 199 mg/dL — ABNORMAL LOW (ref 212.0–360.0)

## 2021-02-25 MED ORDER — LANSOPRAZOLE 30 MG PO CPDR: 30.0000 mg | DELAYED_RELEASE_CAPSULE | Freq: Two times a day (BID) | ORAL | 5 refills | Status: DC

## 2021-02-25 MED ORDER — SODIUM CHLORIDE 0.9 % IV SOLN: 500.0000 mL | Freq: Once | INTRAVENOUS | Status: DC

## 2021-02-25 NOTE — Patient Instructions (Addendum)
INCREASE Prevacid to 30 mg Twice Daily  YOU HAD AN ENDOSCOPIC PROCEDURE TODAY AT THE [facility]:   Refer to the procedure report that was given to you for any specific questions about what was found during the examination.  If the procedure report does not answer your questions, please call your gastroenterologist to clarify.  If you requested that your care partner not be given the details of your procedure findings, then the procedure report has been included in a sealed envelope for you to review at your convenience later.  YOU SHOULD EXPECT: Some feelings of bloating in the abdomen. Passage of more gas than usual.  Walking can help get rid of the air that was put into your GI tract during the procedure and reduce the bloating. If you had a lower endoscopy (such as a colonoscopy or flexible sigmoidoscopy) you may notice spotting of blood in your stool or on the toilet paper. If you underwent a bowel prep for your procedure, you may not have a normal bowel movement for a few days.  Please Note:  You might notice some irritation and congestion in your nose or some drainage.  This is from the oxygen used during your procedure.  There is no need for concern and it should clear up in a day or so.  SYMPTOMS TO REPORT IMMEDIATELY:   Following lower endoscopy (colonoscopy or flexible sigmoidoscopy):  Excessive amounts of blood in the stool  Significant tenderness or worsening of abdominal pains  Swelling of the abdomen that is new, acute  Fever of 100F or higher   Following upper endoscopy (EGD)  Vomiting of blood or coffee ground material  New chest pain or pain under the shoulder blades  Painful or persistently difficult swallowing  New shortness of breath  Fever of 100F or higher  Black, tarry-looking stools  For urgent or emergent issues, a gastroenterologist can be reached at any hour by calling [phone]. Do not use MyChart messaging for urgent concerns.    DIET:   We do recommend a small meal at first, but then you may proceed to your regular diet.  Drink plenty of fluids but you should avoid alcoholic beverages for 24 hours.  ACTIVITY:  You should plan to take it easy for the rest of today and you should NOT DRIVE or use heavy machinery until tomorrow (because of the sedation medicines used during the test).    FOLLOW UP: Our staff will call the number listed on your records 48-72 hours following your procedure to check on you and address any questions or concerns that you may have regarding the information given to you following your procedure. If we do not reach you, we will leave a message.  We will attempt to reach you two times.  During this call, we will ask if you have developed any symptoms of COVID 19. If you develop any symptoms (ie: fever, flu-like symptoms, shortness of breath, cough etc.) before then, please call [phone].  If you test positive for Covid 19 in the 2 weeks post procedure, please call and report this information to [place].    If any biopsies were taken you will be contacted by phone or by letter within the next 1-3 weeks.  Please call us at [phone] if you have not heard about the biopsies in 3 weeks.    SIGNATURES/CONFIDENTIALITY: You and/or your care partner have signed paperwork which will be entered into your electronic medical record.  These signatures attest to  the fact that that the information above on your After Visit Summary has been reviewed and is understood.  Full responsibility of the confidentiality of this discharge information lies with you and/or your care-partner.

## 2021-02-25 NOTE — Op Note (Signed)
[facility] Patient Name: Melanie [name] Procedure Date: 02/25/2021 2:03 PM MRN: [id number] Endoscopist: Beverley Fiedler , MD Age: 86 Referring MD:  Date of Birth: [birth year] Gender: Female Account #: [id number] Procedure:                Flexible Sigmoidoscopy Indications:              Abnormal CT of the GI tract showing anorectal                            thickening, anal pain Medicines:                Monitored Anesthesia Care Procedure:                Pre-Anesthesia Assessment:                           - Prior to the procedure, a History and Physical                            was performed, and patient medications and                            allergies were reviewed. The patient's tolerance of                            previous anesthesia was also reviewed. The risks                            and benefits of the procedure and the sedation                            options and risks were discussed with the patient.                            All questions were answered, and informed consent                            was obtained. Prior Anticoagulants: The patient has                            taken no previous anticoagulant or antiplatelet                            agents. ASA Grade Assessment: III - A patient with                            severe systemic disease. After reviewing the risks                            and benefits, the patient was deemed in                            satisfactory condition to undergo the procedure.  After obtaining informed consent, the scope was                            passed under direct vision. The Endoscope was                            introduced through the anus and advanced to the                            sigmoid colon. The flexible sigmoidoscopy was                            accomplished without difficulty. The patient                            tolerated the procedure well. The quality of the                             bowel preparation was good. Scope In: Scope Out: Findings:                 Hemorrhoids were found on perianal exam.                           Multiple small and large-mouthed diverticula were                            found in the sigmoid colon.                           The rectum appeared normal.                           External and internal hemorrhoids were found during                            retroflexion and during perianal exam. The                            hemorrhoids were medium-sized. Complications:            No immediate complications. Estimated Blood Loss:     Estimated blood loss: none. Impression:               - Diverticulosis in the sigmoid colon.                           - The rectum is normal.                           - External and internal hemorrhoids.                           - No specimens collected. Recommendation:           - Patient has a contact number available for  emergencies. The signs and symptoms of potential                            delayed complications were discussed with the                            patient. Return to normal activities tomorrow.                            Written discharge instructions were provided to the                            patient.                           - Resume previous diet.                           - See other procedure report. Beverley Fiedler, MD 02/25/2021 2:31:09 PM This report has been signed electronically.

## 2021-02-25 NOTE — Progress Notes (Signed)
Pt's states no medical or surgical changes since previsit or office visit. 

## 2021-02-25 NOTE — Op Note (Signed)
[facility] Patient Name: Melanie [name] Procedure Date: 02/25/2021 2:04 PM MRN: [id number] Endoscopist: Beverley Fiedler , MD Age: 86 Referring MD:  Date of Birth: Aug 02, 1935 Gender: Female Account #: [id number] Procedure:                Upper GI endoscopy Indications:              Epigastric abdominal pain, Gastro-esophageal reflux                            disease, abnormal CT scan showing esophageal                            thickening, known large hiatal hernia, history of                            IDA Medicines:                Monitored Anesthesia Care Procedure:                Pre-Anesthesia Assessment:                           - Prior to the procedure, a History and Physical                            was performed, and patient medications and                            allergies were reviewed. The patient's tolerance of                            previous anesthesia was also reviewed. The risks                            and benefits of the procedure and the sedation                            options and risks were discussed with the patient.                            All questions were answered, and informed consent                            was obtained. Prior Anticoagulants: The patient has                            taken no previous anticoagulant or antiplatelet                            agents. ASA Grade Assessment: III - A patient with                            severe systemic disease. After reviewing the risks  and benefits, the patient was deemed in                            satisfactory condition to undergo the procedure.                           After obtaining informed consent, the endoscope was                            passed under direct vision. Throughout the                            procedure, the patient's blood pressure, pulse, and                            oxygen saturations were monitored continuously. The                             Endoscope was introduced through the mouth, and                            advanced to the second part of duodenum. The upper                            GI endoscopy was accomplished without difficulty.                            The patient tolerated the procedure well. Scope In: Scope Out: Findings:                 Normal mucosa was found in the entire esophagus.                           A 10 cm hiatal hernia was found. The proximal                            extent of the gastric folds (end of tubular                            esophagus) was 30 cm from the incisors. The hiatal                            narrowing was 40 cm from the incisors. The Z-line                            was 30 cm from the incisors.                           Severe inflammation characterized by congestion                            (edema), erosions, erythema and shallow ulcerations  was found in the gastric antrum. Biopsies were                            taken with a cold forceps for histology and                            Helicobacter pylori testing.                           Multiple linear and superficial duodenal ulcers                            with no stigmata of bleeding were found in the                            duodenal bulb. The largest lesion was 7 mm in                            largest dimension.                           The second portion of the duodenum was normal. Complications:            No immediate complications. Estimated Blood Loss:     Estimated blood loss was minimal. Impression:               - Normal mucosa was found in the entire esophagus.                           - 10 cm hiatal hernia.                           - Gastritis with ulceration in the antrum. Biopsied.                           - Duodenal ulcers with no stigmata of bleeding.                           - Normal second portion of the duodenum. Recommendation:            - Patient has a contact number available for                            emergencies. The signs and symptoms of potential                            delayed complications were discussed with the                            patient. Return to normal activities tomorrow.                            Written discharge instructions were provided to the  patient.                           - Resume previous diet.                           - Continue present medications. Increase Prevacid                            to 30 mg twice daily. Avoid NSAIDs.                           - Await pathology results.                           - Check CBC, ferritin + IBC panel. Beverley Fiedler, MD 02/25/2021 2:27:28 PM This report has been signed electronically.

## 2021-02-25 NOTE — Progress Notes (Signed)
Called to room to assist during endoscopic procedure.  Patient ID and intended procedure confirmed with present staff. Received instructions for my participation in the procedure from the performing physician.  

## 2021-02-25 NOTE — Progress Notes (Signed)
Report to PACU, RN, vss, BBS= Clear.  

## 2021-02-27 ENCOUNTER — Telehealth: Payer: Self-pay

## 2021-02-27 NOTE — Telephone Encounter (Signed)
  Follow up Call-  Call back number 02/25/2021 04/16/2020  Post procedure Call Back phone  # [phone] Call Almira Coaster her daughter. Patient hard of hearing.  Permission to leave phone message Yes Yes  Some recent data might be hidden     Patient questions:  Do you have a fever, pain , or abdominal swelling? No. Pain Score  0 *  Have you tolerated food without any problems? Yes.    Have you been able to return to your normal activities? Yes.    Do you have any questions about your discharge instructions: Diet   No. Medications  No. Follow up visit  No.  Do you have questions or concerns about your Care? No.  Actions: * If pain score is 4 or above: No action needed, pain <4.  1. Have you developed a fever since your procedure? No   2.   Have you had an respiratory symptoms (SOB or cough) since your procedure? No   3.   Have you tested positive for COVID 19 since your procedure? No   4.   Have you had any family members/close contacts diagnosed with the COVID 19 since your procedure?  No    If yes to any of these questions please route to Laverna Peace, RN and Karlton Lemon, RN

## 2021-03-05 ENCOUNTER — Encounter: Payer: Self-pay | Admitting: Internal Medicine

## 2021-03-19 ENCOUNTER — Telehealth: Payer: Self-pay | Admitting: Gastroenterology

## 2021-03-19 NOTE — Telephone Encounter (Signed)
Inbound call from patient stating since taking lansoprazole medication she has not felt well and is wanting if it can be changed to alternate medication.  Please advise.

## 2021-03-20 MED ORDER — FAMOTIDINE 20 MG PO TABS: 20.0000 mg | ORAL_TABLET | Freq: Two times a day (BID) | ORAL | 3 refills | Status: DC

## 2021-03-20 NOTE — Telephone Encounter (Signed)
Patient previously stated she also can't take Omeprazole or Pantoprazole. Please advise.

## 2021-03-20 NOTE — Telephone Encounter (Signed)
Spoke with patient and informed of change from a PPI to Pepcid. Patient voiced understanding. Sent script to pharmacy.

## 2021-03-20 NOTE — Telephone Encounter (Signed)
We might just need to try pepcid 20 mg BID since she has been intolerant to 3 PPIs.  Please send prescription for #60 and a few refills.

## 2021-04-24 ENCOUNTER — Telehealth: Payer: Self-pay | Admitting: Internal Medicine

## 2021-04-24 NOTE — Telephone Encounter (Signed)
Pt called and states that after her procedure she was told she may have some pain on her right side but this would go away. Pt states the pain has not gone away and Sunday she will have had it for 2 months. At times the pain goes around to the front of her right shoulder. States she saw her PCP and he did blood work to see if pain was related to arthritis and the labs were fine. Pts wants to know why she was told to look out for shoulder pain and also if perhaps she was moved or pulled some way after procedure that caused her discomfort. Please advise.

## 2021-04-24 NOTE — Telephone Encounter (Signed)
Inbound call from pt requesting a call back stating that she is having pain in her shoulder pain since her procedure in May 2022. Pt wants to know how to treat the pain. Please advise. Thanks.

## 2021-04-28 NOTE — Telephone Encounter (Signed)
Spoke with pt and she is aware but reports she is not having abd pain. States the pain is in the back side of her shoulder and goes all the way down into her thumb. When she goes to reach for something she cannot pick it up good. Pt wonders if she was perhaps pulled or something during the procedure. Please advise.

## 2021-04-28 NOTE — Telephone Encounter (Signed)
Of of large HH and gastroduodenitis with ulcers Please ensure she is taking the lansoprazole BID  Also hx of gallstones  Pain in RUQ could be HH related as it is large, but cannot exclude a hepatobiliary etiology If agreeable I would recommend MRI abd with and without contrast + MRCP Thanks JMP

## 2021-04-29 NOTE — Telephone Encounter (Signed)
Spoke with pt and she is aware. States she has been thinking about seeing a neurologist. She had the name of Dr. Lurena Joiner Tat.

## 2021-05-22 ENCOUNTER — Other Ambulatory Visit: Payer: Self-pay

## 2021-05-22 ENCOUNTER — Telehealth: Payer: Self-pay

## 2021-05-22 ENCOUNTER — Encounter: Payer: Self-pay | Admitting: Hematology and Oncology

## 2021-05-22 ENCOUNTER — Inpatient Hospital Stay: Payer: Medicare Other | Attending: Hematology and Oncology | Admitting: Hematology and Oncology

## 2021-05-22 ENCOUNTER — Inpatient Hospital Stay: Payer: Medicare Other

## 2021-05-22 VITALS — BP 138/73 | HR 72 | Temp 97.4°F | Resp 18 | Ht 62.0 in | Wt 184.8 lb

## 2021-05-22 DIAGNOSIS — D500 Iron deficiency anemia secondary to blood loss (chronic): Secondary | ICD-10-CM

## 2021-05-22 DIAGNOSIS — D509 Iron deficiency anemia, unspecified: Secondary | ICD-10-CM | POA: Diagnosis present

## 2021-05-22 DIAGNOSIS — Z79899 Other long term (current) drug therapy: Secondary | ICD-10-CM | POA: Insufficient documentation

## 2021-05-22 DIAGNOSIS — R6889 Other general symptoms and signs: Secondary | ICD-10-CM | POA: Diagnosis not present

## 2021-05-22 DIAGNOSIS — K5520 Angiodysplasia of colon without hemorrhage: Secondary | ICD-10-CM | POA: Diagnosis not present

## 2021-05-22 LAB — CBC WITH DIFFERENTIAL/PLATELET
Abs Immature Granulocytes: 0.01 10*3/uL (ref 0.00–0.07)
Basophils Absolute: 0 10*3/uL (ref 0.0–0.1)
Basophils Relative: 1 %
Eosinophils Absolute: 0.3 10*3/uL (ref 0.0–0.5)
Eosinophils Relative: 4 %
HCT: 42.9 % (ref 36.0–46.0)
Hemoglobin: 14.2 g/dL (ref 12.0–15.0)
Immature Granulocytes: 0 %
Lymphocytes Relative: 17 %
Lymphs Abs: 1.2 10*3/uL (ref 0.7–4.0)
MCH: 29.6 pg (ref 26.0–34.0)
MCHC: 33.1 g/dL (ref 30.0–36.0)
MCV: 89.6 fL (ref 80.0–100.0)
Monocytes Absolute: 0.6 10*3/uL (ref 0.1–1.0)
Monocytes Relative: 9 %
Neutro Abs: 4.9 10*3/uL (ref 1.7–7.7)
Neutrophils Relative %: 69 %
Platelets: 277 10*3/uL (ref 150–400)
RBC: 4.79 MIL/uL (ref 3.87–5.11)
RDW: 13 % (ref 11.5–15.5)
WBC: 7.1 10*3/uL (ref 4.0–10.5)
nRBC: 0 % (ref 0.0–0.2)

## 2021-05-22 LAB — IRON AND TIBC
Iron: 100 ug/dL (ref 41–142)
Saturation Ratios: 37 % (ref 21–57)
TIBC: 271 ug/dL (ref 236–444)
UIBC: 172 ug/dL (ref 120–384)

## 2021-05-22 LAB — FERRITIN: Ferritin: 172 ng/mL (ref 11–307)

## 2021-05-22 NOTE — Telephone Encounter (Signed)
-----   Message from [name], MD sent at 05/22/2021  1:09 PM EDT ----- Call her ferritin 172 I will send LOS for repeat Labs only in 6 months

## 2021-05-22 NOTE — Progress Notes (Signed)
[facility] OFFICE PROGRESS NOTE  Melanie Real, MD  ASSESSMENT & PLAN:  Angiodysplasia of colon She had recent EGD performed on April 16, 2020  which confirmed signs of erosions/gastritis Biopsy was negative Her last infusion was several months ago and her blood count is stable In fact, she is not anemic and her ferritin level is adequate I plan to recheck again in 6 months  Multiple complaints The patient has significant issues related to  1) history of incontinence 2) she has significant intermittent numbness that comes and goes in her hands and feet; these were discussed before 3) she has intermittent swelling of her joints in her hands and she would like to talk to her dermatologist about.  She stated nobody can figure out why 4) she complain of GI symptoms.  She did not take Pepcid or Prevacid because she did not think it would be helpful I listened to all her complaints as usual and I would defer to multiple specialist for further management  No orders of the defined types were placed in this encounter.   The total time spent in the appointment was 20 minutes encounter with patients including review of chart and various tests results, discussions about plan of care and coordination of care plan   All questions were answered. The patient knows to call the clinic with any problems, questions or concerns. No barriers to learning was detected.    [name], MD 8/11/20226:02 PM  INTERVAL HISTORY: Melanie [name] 86 y.o. female returns for further follow-up on history of recurrent iron deficiency anemia secondary to angiodysplasia She denies recent melena or hematochezia She denies taking NSAID or aspirin like products She had multiple complaints which she shared with me today  SUMMARY OF HEMATOLOGIC HISTORY:  The patient has been complaining of fatigue and was found to have severe iron deficiency anemia. Approximately a year or 2 ago, she received 2 units of blood  transfusion because of this. She had extensive GI evaluation with EGD and colonoscopy and was never found to have any sort of GI bleed. She has received 4 intravenous doses of iron infusion and the last infusion in 3 months ago. Bone marrow aspirate and biopsy in January 2013 showed no evidence of malignancy or persistent absence of iron stores. In 2014, she received 6 doses of intravenous iron infusion. She was referred to gastroenterology for further evaluation due to suspected GI bleed. Repeat GI endoscopy dated 12/26/2013 showed angiodysplastic lesion and moderate diverticulosis. The patient had received numerous intravenous iron infusion almost on a monthly basis from 03/15/2013 to 01/17/15. Her last intravenous iron was in July 2016, and then resumed in 2018 On April 16, 2020, she had repeat upper endoscopy - Normal mucosa was found in the entire esophagus. - A 10 cm hiatal hernia with multiple Cameron erosions was found. The proximal extent of the gastric folds (end of tubular esophagus) was 30 cm from the incisors. The hiatal narrowing was 40 cm from the incisors. - Multiple erosions as above with adherent heme without active bleeding were found in the gastric body. Biopsies were taken with a cold forceps for histology and Helicobacter pylori testing. These are secondary to the large hiatal hernia. - Moderate inflammation characterized by erythema was found in the duodenal bulb. - The second portion of the duodenum was normal. Her last dose of IV iron was on November 29, 2020  I have reviewed the past medical history, past surgical history, social history and family history  with the patient and they are unchanged from previous note.  ALLERGIES:  is allergic to cephalexin, clindamycin/lincomycin, codeine, levofloxacin, morphine and related, other, quinolones, sulfa antibiotics, tramadol, amoxicillin, fexofenadine hcl, floxin [ocuflox], neurontin [gabapentin], and septra [bactrim].  MEDICATIONS:   Current Outpatient Medications  Medication Sig Dispense Refill   aspirin 81 MG tablet Take 81 mg by mouth daily.      cholecalciferol (VITAMIN D) 1000 units tablet Take 5,000 Units by mouth daily.     Cyanocobalamin (VITAMIN B12 PO) Take 500 mcg by mouth daily.  (Patient not taking: Reported on 02/25/2021)     folic acid (FOLVITE) 1 MG tablet TAKE 1 TABLET BY MOUTH EVERY DAY 90 tablet 3   hydrocortisone (ANUSOL-HC) 2.5 % rectal cream Place 1 application rectally 2 (two) times daily. (Patient not taking: No sig reported) 30 g 1   lansoprazole (PREVACID) 30 MG capsule Take 1 capsule (30 mg total) by mouth in the morning and at bedtime. 60 capsule 5   metoprolol (LOPRESSOR) 50 MG tablet Take 1 tablet (50 mg total) by mouth 2 (two) times daily. 180 tablet 1   polyethylene glycol powder (GLYCOLAX/MIRALAX) powder Take 17 g by mouth daily as needed. 1051 g 1   No current facility-administered medications for this visit.     REVIEW OF SYSTEMS:   Constitutional: Denies fevers, chills or night sweats Eyes: Denies blurriness of vision Ears, nose, mouth, throat, and face: Denies mucositis or sore throat Respiratory: Denies cough, dyspnea or wheezes Cardiovascular: Denies palpitation, chest discomfort or lower extremity swelling Skin: Denies abnormal skin rashes Lymphatics: Denies new lymphadenopathy or easy bruising Behavioral/Psych: Mood is stable, no new changes  All other systems were reviewed with the patient and are negative.  PHYSICAL EXAMINATION: ECOG PERFORMANCE STATUS: 1 - Symptomatic but completely ambulatory  Vitals:   05/22/21 1135  BP: 138/73  Pulse: 72  Resp: 18  Temp: (!) 97.4 F (36.3 C)  SpO2: 98%   Filed Weights   05/22/21 1135  Weight: 184 lb 12.8 oz (83.8 kg)    GENERAL:alert, no distress and comfortable NEURO: alert & oriented x 3 with fluent speech, no focal motor/sensory deficits  LABORATORY DATA:  I have reviewed the data as listed     Component Value  Date/Time   NA 140 11/02/2016 1403   NA 144 02/12/2014 0849   K 4.3 11/02/2016 1403   K 3.6 02/12/2014 0849   CL 106 11/02/2016 1403   CL 109 (H) 12/23/2012 1100   CO2 28 11/02/2016 1403   CO2 24 02/12/2014 0849   GLUCOSE 99 11/02/2016 1403   GLUCOSE 152 (H) 02/12/2014 0849   GLUCOSE 106 (H) 12/23/2012 1100   BUN 18 11/02/2016 1403   BUN 15.4 02/12/2014 0849   CREATININE 0.80 12/26/2020 1021   CREATININE 0.8 02/12/2014 0849   CALCIUM 10.6 (H) 11/02/2016 1403   CALCIUM 10.7 (H) 02/12/2014 0849   PROT 7.1 08/22/2015 1041   PROT 6.3 (L) 02/12/2014 0849   ALBUMIN 4.1 08/22/2015 1041   ALBUMIN 3.4 (L) 02/12/2014 0849   AST 17 08/22/2015 1041   AST 13 02/12/2014 0849   ALT 16 08/22/2015 1041   ALT 8 02/12/2014 0849   ALKPHOS 89 08/22/2015 1041   ALKPHOS 78 02/12/2014 0849   BILITOT 0.5 08/22/2015 1041   BILITOT 0.25 02/12/2014 0849   GFRNONAA 80 (L) 08/22/2011 1205   GFRAA >90 08/22/2011 1205    No results found for: SPEP, UPEP  Lab Results  Component Value Date  WBC 7.1 05/22/2021   NEUTROABS 4.9 05/22/2021   HGB 14.2 05/22/2021   HCT 42.9 05/22/2021   MCV 89.6 05/22/2021   PLT 277 05/22/2021      Chemistry      Component Value Date/Time   NA 140 11/02/2016 1403   NA 144 02/12/2014 0849   K 4.3 11/02/2016 1403   K 3.6 02/12/2014 0849   CL 106 11/02/2016 1403   CL 109 (H) 12/23/2012 1100   CO2 28 11/02/2016 1403   CO2 24 02/12/2014 0849   BUN 18 11/02/2016 1403   BUN 15.4 02/12/2014 0849   CREATININE 0.80 12/26/2020 1021   CREATININE 0.8 02/12/2014 0849      Component Value Date/Time   CALCIUM 10.6 (H) 11/02/2016 1403   CALCIUM 10.7 (H) 02/12/2014 0849   ALKPHOS 89 08/22/2015 1041   ALKPHOS 78 02/12/2014 0849   AST 17 08/22/2015 1041   AST 13 02/12/2014 0849   ALT 16 08/22/2015 1041   ALT 8 02/12/2014 0849   BILITOT 0.5 08/22/2015 1041   BILITOT 0.25 02/12/2014 0849

## 2021-05-22 NOTE — Assessment & Plan Note (Signed)
She had recent EGD performed on April 16, 2020  which confirmed signs of erosions/gastritis Biopsy was negative Her last infusion was several months ago and her blood count is stable In fact, she is not anemic and her ferritin level is adequate I plan to recheck again in 6 months

## 2021-05-22 NOTE — Assessment & Plan Note (Signed)
The patient has significant issues related to  1) history of incontinence 2) she has significant intermittent numbness that comes and goes in her hands and feet; these were discussed before 3) she has intermittent swelling of her joints in her hands and she would like to talk to her dermatologist about.  She stated nobody can figure out why 4) she complain of GI symptoms.  She did not take Pepcid or Prevacid because she did not think it would be helpful I listened to all her complaints as usual and I would defer to multiple specialist for further management

## 2021-05-22 NOTE — Telephone Encounter (Signed)
Called and given below message. She verbalized understanding. 

## 2021-05-26 ENCOUNTER — Telehealth: Payer: Self-pay | Admitting: Hematology and Oncology

## 2021-05-26 ENCOUNTER — Telehealth: Payer: Self-pay

## 2021-05-26 NOTE — Telephone Encounter (Signed)
She called and left a message requesting a copy of 8/11 mailed to her.  Mailed lab results to her address.

## 2021-05-26 NOTE — Telephone Encounter (Signed)
Scheduled appt per 8/11 sch msg. Pt aware.  

## 2021-08-07 IMAGING — CT CT ABD-PELV W/ CM
2 of 5 series · 15 of 46 positions shown, 17 images · IV contrast (OMNIPAQUE)
Comparison: None.

CLINICAL DATA: Epigastric pain, weight with rectal pain and
bleeding

EXAM:
CT ABDOMEN AND PELVIS WITH CONTRAST
TECHNIQUE: Multidetector CT imaging of the abdomen and pelvis was performed
using the standard protocol following bolus administration of
intravenous contrast.
CONTRAST:  100mL OMNIPAQUE IOHEXOL 300 MG/ML  SOLN

[Series 2: axial st · axial · 0.70mm/px · z∈[-337,+23]mm · 12 of 86 slices shown, 14 images]
[im 7/86  soft-tissue]
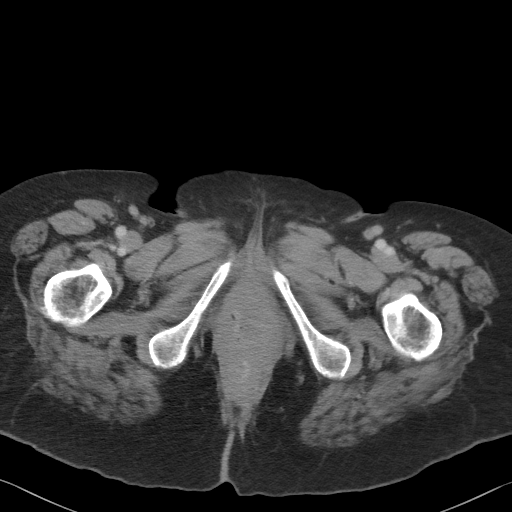
[im 7/86  bone]
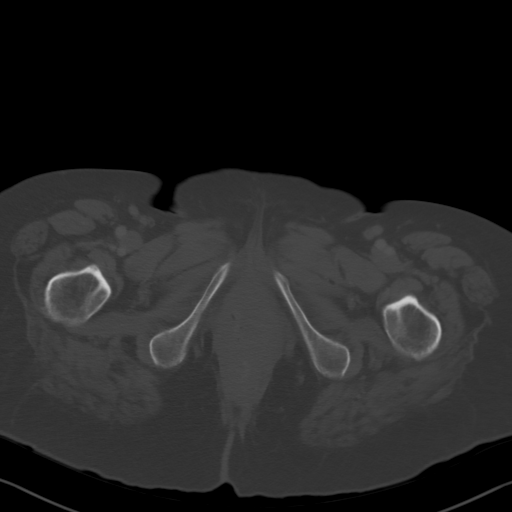
[im 14/86  soft-tissue]
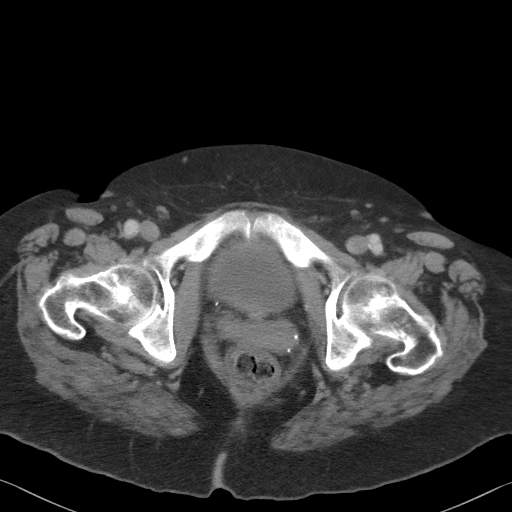
[im 20/86  soft-tissue]
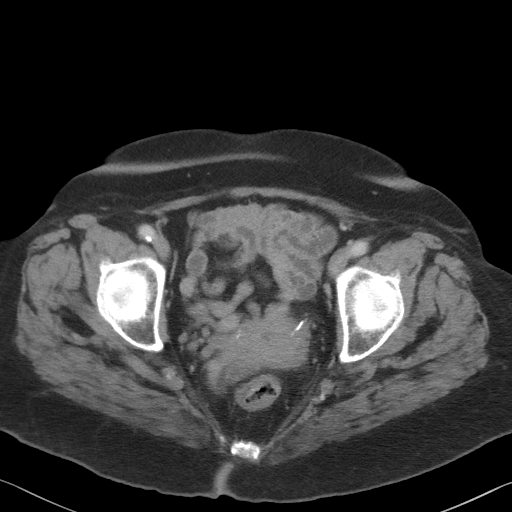
[im 27/86  soft-tissue]
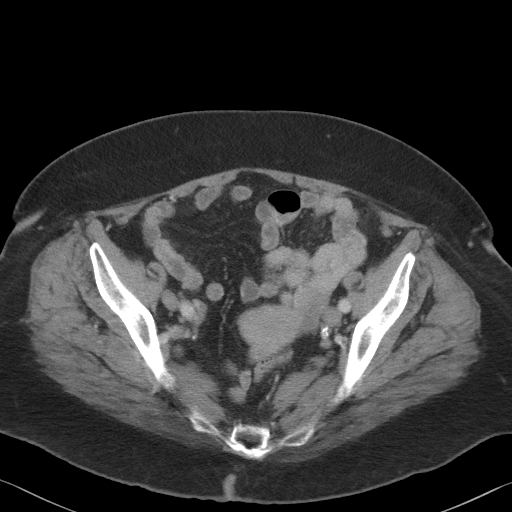
[im 33/86  soft-tissue]
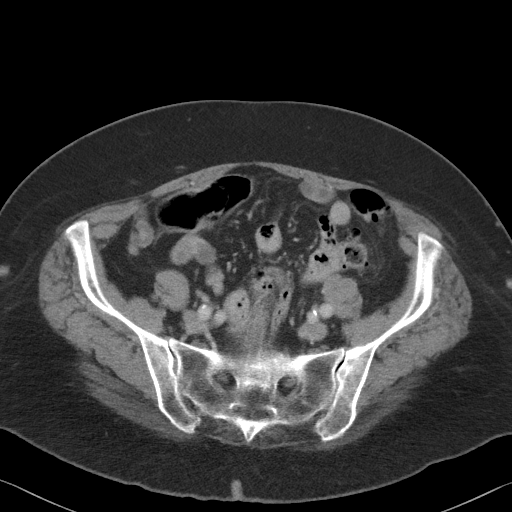
[im 40/86  soft-tissue]
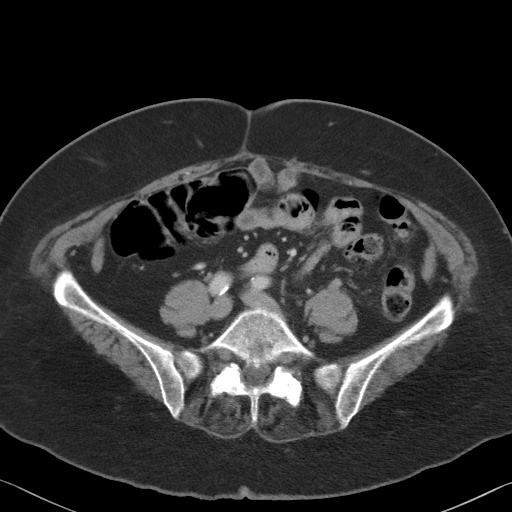
[im 46/86  soft-tissue]
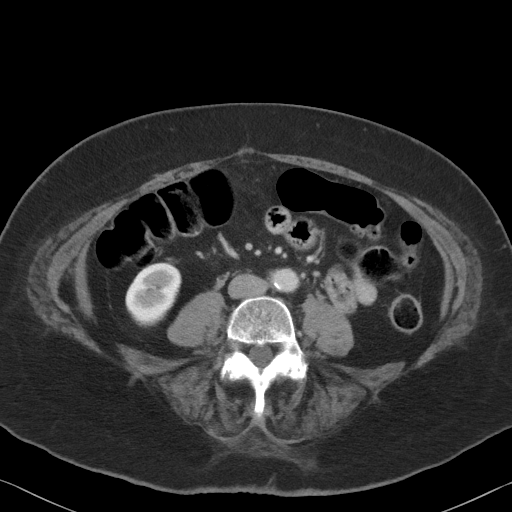
[im 53/86  soft-tissue]
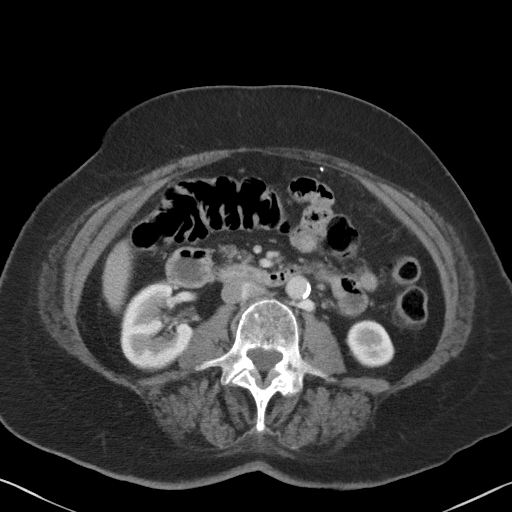
[im 59/86  soft-tissue]
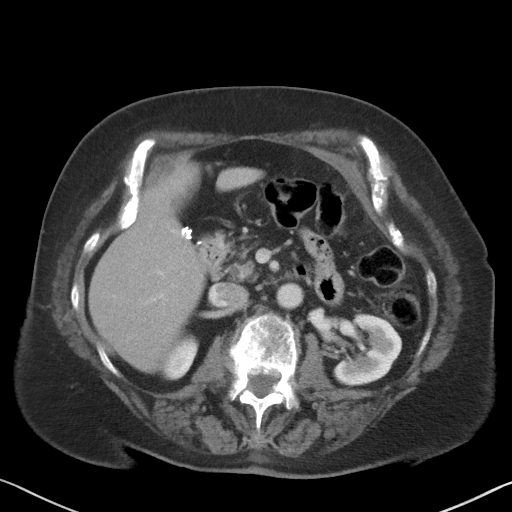
[im 59/86  bone]
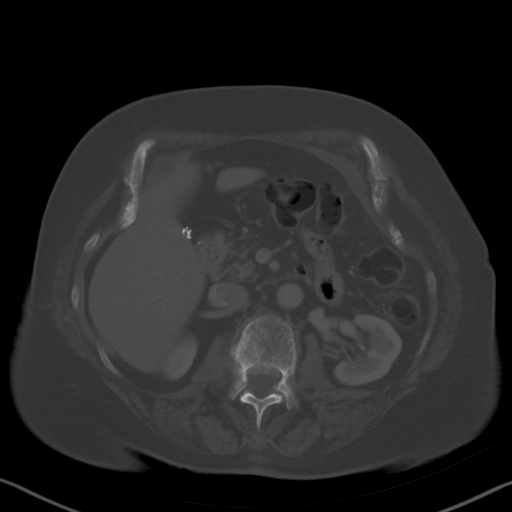
[im 66/86  soft-tissue]
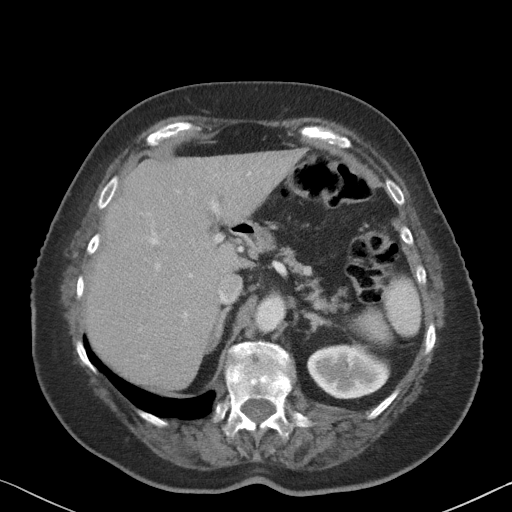
[im 72/86  soft-tissue]
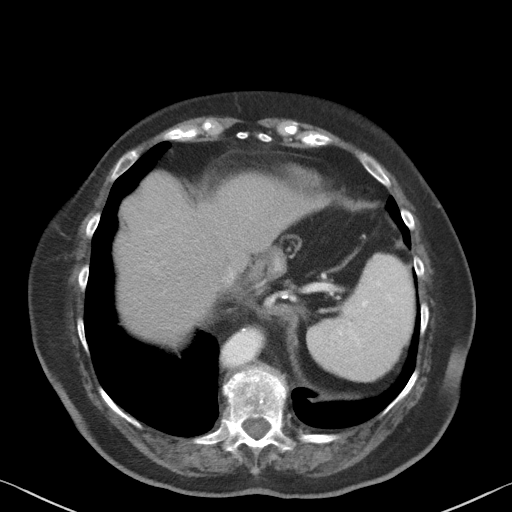
[im 79/86  soft-tissue]
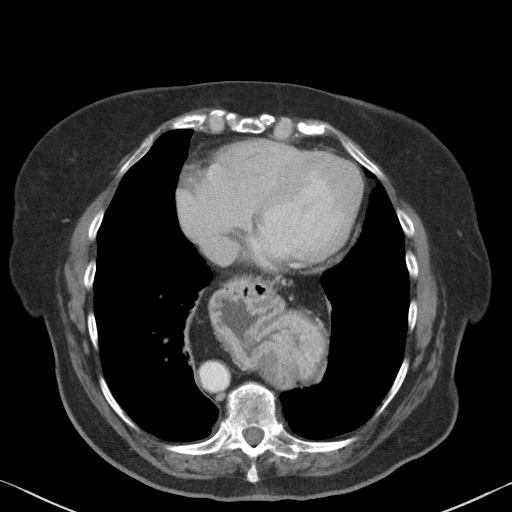

[Series 4: coronal st · coronal · 0.90mm/px · 3 of 84 slices shown]
[im 28/84  soft-tissue]
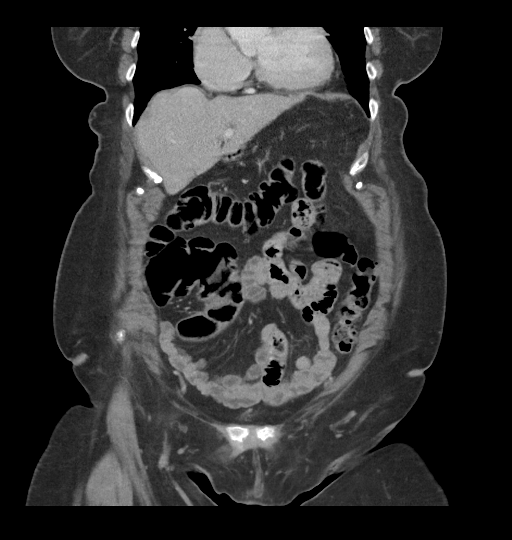
[im 37/84  soft-tissue]
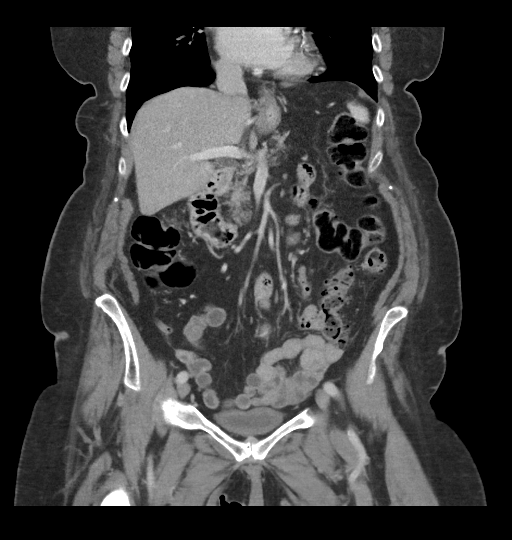
[im 47/84  soft-tissue]
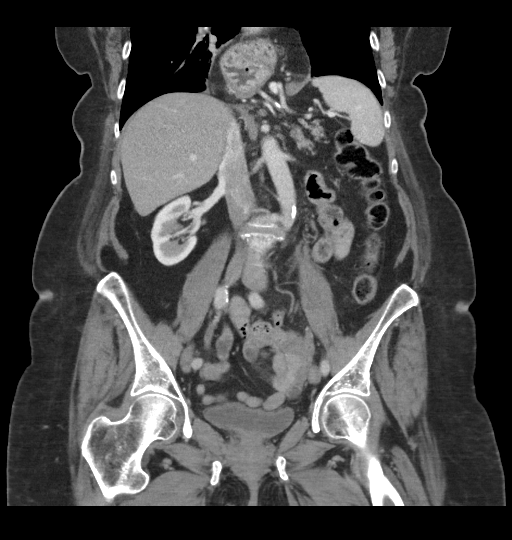

[15 of 46 positions shown; findings below may reference images not displayed]

FINDINGS: Lower chest: Scattered subsegmental atelectasis. There are few small
pulmonary nodules for instance a 3 mm right lower lobe nodule on
image [DATE] and a 4 mm right lower lobe nodule on image [DATE].
Coronary artery calcifications. Thoracic aortic calcifications with
tortuous thoracic aorta. Large hiatal hernia/intrathoracic stomach.

Hepatobiliary: No suspicious hepatic lesions. Gallbladder surgically
absent. Mild enlargement of the extrahepatic biliary tree with the
common duct measuring up to 8 mm on image 38/4.

Pancreas: Fatty pancreatic atrophy.  No pancreatic ductal dilation.

Spleen: Unremarkable.

Adrenals/Urinary Tract: Bilateral adrenal glands are unremarkable.

No hydronephrosis. Symmetric enhancement and excretion from the
bilateral kidneys. Exophytic 1 cm right lower pole renal cyst. There
other tiny bilateral hypodense renal lesions which are technically
too small to accurately characterize but favored represent cysts.

Urinary bladder is grossly unremarkable for degree of distension.

Stomach/Bowel: Large hiatal hernia/intrathoracic stomach with
low-density wall thickening at the gastroesophageal junction on
image 6[REDACTED]. Normal positioning of the duodenum/ligament of
Treitz. No suspicious small bowel wall thickening or dilation. The
appendix and terminal ileum are unremarkable. Descending and sigmoid
colonic diverticulosis without findings of acute diverticulitis.
Rectal prolapse apparent ill-defined soft tissue thickening in the
anal canal, which is nonspecific and poorly evaluated on CT.

Vascular/Lymphatic: Aortic and branch vessel atherosclerosis. No
pathologically enlarged abdominal or pelvic lymph nodes.

Reproductive: There is fluid within a thickened endometrial canal
with apparent nodular area of enhancement in the fundus on image
55/4 and 57/5. The adnexa are difficult to evaluate given the
intervening loops small large bowel however there are prominent
bilateral soft tissue bilateral adnexal lesions measuring 3.1 cm on
the right and 2.8 cm on the left, which are larger in size than
expected for a postmenopausal female of this age.

Other: No abdominopelvic ascites. No discrete peritoneal nodularity.

Musculoskeletal: Multilevel degenerative change of the spine. No
aggressive lytic or blastic lesion of bone. No acute osseous
abnormality.
IMPRESSION: 1. There is fluid within a thickened endometrial canal with nodular
area of enhancement in the fundus as well as prominent bilateral
soft tissue density adnexal lesions which are larger than would be
expected for normal ovaries in a postmenopausal female this age.
Findings which are at least somewhat suspicious for gynecologic
malignancy. Recommend further evaluation with pelvic ultrasound and
gynecologic surgery consult.
2. Apparent ill-defined soft tissue thickening in the anal
canal/anorectal junction which is nonspecific and poorly evaluated
by CT, correlation with direct visualization is suggested.
3. Large hiatal hernia/intrathoracic stomach with low-density wall
thickening at the gastroesophageal junction, which is nonspecific
and may represent esophagitis, Olimpia or esophageal neoplasm,
consider further evaluation with endoscopy.
4. Mild enlargement of the extrahepatic biliary tree with the common
duct measuring up to 8 mm. Findings may be related to post
cholecystectomy state. Correlation with LFTs.
5. There are few small pulmonary nodules measuring up to 4 mm in the
right lower lobe, these are nonspecific. Recommend follow-up
dedicated chest CT in 3 months to assess stability.
6. Aortic atherosclerosis.

Aortic Atherosclerosis (NVIF0-ZV8.8).

These results will be called to the ordering clinician or
representative by the Radiologist Assistant, and communication
documented in the PACS or [REDACTED].

## 2021-10-01 ENCOUNTER — Other Ambulatory Visit: Payer: Self-pay | Admitting: Internal Medicine

## 2021-10-01 DIAGNOSIS — Z1231 Encounter for screening mammogram for malignant neoplasm of breast: Secondary | ICD-10-CM

## 2021-10-22 ENCOUNTER — Other Ambulatory Visit: Payer: Self-pay

## 2021-10-22 ENCOUNTER — Inpatient Hospital Stay: Payer: Medicare Other | Attending: Hematology and Oncology

## 2021-10-22 DIAGNOSIS — K5520 Angiodysplasia of colon without hemorrhage: Secondary | ICD-10-CM | POA: Insufficient documentation

## 2021-10-22 DIAGNOSIS — D500 Iron deficiency anemia secondary to blood loss (chronic): Secondary | ICD-10-CM | POA: Diagnosis not present

## 2021-10-22 LAB — CBC WITH DIFFERENTIAL/PLATELET
Abs Immature Granulocytes: 0.01 10*3/uL (ref 0.00–0.07)
Basophils Absolute: 0 10*3/uL (ref 0.0–0.1)
Basophils Relative: 1 %
Eosinophils Absolute: 0.2 10*3/uL (ref 0.0–0.5)
Eosinophils Relative: 3 %
HCT: 42.8 % (ref 36.0–46.0)
Hemoglobin: 14.2 g/dL (ref 12.0–15.0)
Immature Granulocytes: 0 %
Lymphocytes Relative: 18 %
Lymphs Abs: 0.9 10*3/uL (ref 0.7–4.0)
MCH: 28.9 pg (ref 26.0–34.0)
MCHC: 33.2 g/dL (ref 30.0–36.0)
MCV: 87 fL (ref 80.0–100.0)
Monocytes Absolute: 0.4 10*3/uL (ref 0.1–1.0)
Monocytes Relative: 7 %
Neutro Abs: 3.7 10*3/uL (ref 1.7–7.7)
Neutrophils Relative %: 71 %
Platelets: 270 10*3/uL (ref 150–400)
RBC: 4.92 MIL/uL (ref 3.87–5.11)
RDW: 13.6 % (ref 11.5–15.5)
WBC: 5.3 10*3/uL (ref 4.0–10.5)
nRBC: 0 % (ref 0.0–0.2)

## 2021-10-22 LAB — FERRITIN: Ferritin: 49 ng/mL (ref 11–307)

## 2021-10-23 ENCOUNTER — Other Ambulatory Visit: Payer: Self-pay | Admitting: Hematology and Oncology

## 2021-10-23 ENCOUNTER — Telehealth: Payer: Self-pay

## 2021-10-23 DIAGNOSIS — D500 Iron deficiency anemia secondary to blood loss (chronic): Secondary | ICD-10-CM

## 2021-10-23 NOTE — Telephone Encounter (Signed)
-----   Message from [name], MD sent at 10/23/2021 10:36 AM EST ----- Regarding: labs Pls call her, labs and iron ok, I suggest her to come back in 3 months with labs again If she is ok, please schedule lab only

## 2021-10-23 NOTE — Telephone Encounter (Signed)
Called back. Given message from Dr. Bertis Ruddy. She verbalized understanding. Appt scheduled and mailed out 1/11 lab to her address after verifying her address.

## 2021-10-23 NOTE — Telephone Encounter (Signed)
Called and left below message. Ask her to call the office back to schedule lab appt.

## 2022-01-20 ENCOUNTER — Other Ambulatory Visit: Payer: Self-pay | Admitting: Hematology and Oncology

## 2022-01-20 ENCOUNTER — Inpatient Hospital Stay: Payer: Medicare Other

## 2022-01-20 ENCOUNTER — Inpatient Hospital Stay: Payer: Medicare Other | Attending: Hematology and Oncology

## 2022-01-20 ENCOUNTER — Telehealth: Payer: Self-pay | Admitting: *Deleted

## 2022-01-20 ENCOUNTER — Other Ambulatory Visit: Payer: Self-pay

## 2022-01-20 DIAGNOSIS — K5520 Angiodysplasia of colon without hemorrhage: Secondary | ICD-10-CM | POA: Insufficient documentation

## 2022-01-20 DIAGNOSIS — D500 Iron deficiency anemia secondary to blood loss (chronic): Secondary | ICD-10-CM | POA: Diagnosis present

## 2022-01-20 LAB — IRON AND IRON BINDING CAPACITY (CC-WL,HP ONLY)
Iron: 127 ug/dL (ref 28–170)
Saturation Ratios: 38 % — ABNORMAL HIGH (ref 10.4–31.8)
TIBC: 330 ug/dL (ref 250–450)
UIBC: 203 ug/dL (ref 148–442)

## 2022-01-20 LAB — CBC WITH DIFFERENTIAL (CANCER CENTER ONLY)
Abs Immature Granulocytes: 0.01 10*3/uL (ref 0.00–0.07)
Basophils Absolute: 0 10*3/uL (ref 0.0–0.1)
Basophils Relative: 1 %
Eosinophils Absolute: 0.3 10*3/uL (ref 0.0–0.5)
Eosinophils Relative: 5 %
HCT: 42.7 % (ref 36.0–46.0)
Hemoglobin: 13.9 g/dL (ref 12.0–15.0)
Immature Granulocytes: 0 %
Lymphocytes Relative: 19 %
Lymphs Abs: 1.1 10*3/uL (ref 0.7–4.0)
MCH: 29 pg (ref 26.0–34.0)
MCHC: 32.6 g/dL (ref 30.0–36.0)
MCV: 89 fL (ref 80.0–100.0)
Monocytes Absolute: 0.4 10*3/uL (ref 0.1–1.0)
Monocytes Relative: 7 %
Neutro Abs: 3.9 10*3/uL (ref 1.7–7.7)
Neutrophils Relative %: 68 %
Platelet Count: 257 10*3/uL (ref 150–400)
RBC: 4.8 MIL/uL (ref 3.87–5.11)
RDW: 13.6 % (ref 11.5–15.5)
WBC Count: 5.7 10*3/uL (ref 4.0–10.5)
nRBC: 0 % (ref 0.0–0.2)

## 2022-01-20 LAB — FERRITIN: Ferritin: 46 ng/mL (ref 11–307)

## 2022-01-20 NOTE — Telephone Encounter (Signed)
Informed pt that per Dr.Gorsuch CBC and iron studies are stable. Scheduled pt for a follow up lab appointment on April 29, 2022 at 11am. Pt verbalized agreement and confirmation.  ?

## 2022-04-06 ENCOUNTER — Other Ambulatory Visit: Payer: Self-pay | Admitting: Internal Medicine

## 2022-04-06 DIAGNOSIS — M810 Age-related osteoporosis without current pathological fracture: Secondary | ICD-10-CM

## 2022-04-29 ENCOUNTER — Other Ambulatory Visit: Payer: Medicare Other

## 2022-06-30 ENCOUNTER — Other Ambulatory Visit: Payer: Self-pay

## 2022-06-30 ENCOUNTER — Inpatient Hospital Stay: Payer: Medicare Other | Attending: Hematology and Oncology

## 2022-06-30 DIAGNOSIS — D509 Iron deficiency anemia, unspecified: Secondary | ICD-10-CM | POA: Insufficient documentation

## 2022-06-30 DIAGNOSIS — D500 Iron deficiency anemia secondary to blood loss (chronic): Secondary | ICD-10-CM

## 2022-06-30 LAB — CBC WITH DIFFERENTIAL (CANCER CENTER ONLY)
Abs Immature Granulocytes: 0.01 10*3/uL (ref 0.00–0.07)
Basophils Absolute: 0 10*3/uL (ref 0.0–0.1)
Basophils Relative: 1 %
Eosinophils Absolute: 0.3 10*3/uL (ref 0.0–0.5)
Eosinophils Relative: 5 %
HCT: 42.7 % (ref 36.0–46.0)
Hemoglobin: 14.4 g/dL (ref 12.0–15.0)
Immature Granulocytes: 0 %
Lymphocytes Relative: 20 %
Lymphs Abs: 1.1 10*3/uL (ref 0.7–4.0)
MCH: 29.3 pg (ref 26.0–34.0)
MCHC: 33.7 g/dL (ref 30.0–36.0)
MCV: 86.8 fL (ref 80.0–100.0)
Monocytes Absolute: 0.5 10*3/uL (ref 0.1–1.0)
Monocytes Relative: 8 %
Neutro Abs: 3.6 10*3/uL (ref 1.7–7.7)
Neutrophils Relative %: 66 %
Platelet Count: 260 10*3/uL (ref 150–400)
RBC: 4.92 MIL/uL (ref 3.87–5.11)
RDW: 13.1 % (ref 11.5–15.5)
WBC Count: 5.4 10*3/uL (ref 4.0–10.5)
nRBC: 0 % (ref 0.0–0.2)

## 2022-06-30 LAB — IRON AND IRON BINDING CAPACITY (CC-WL,HP ONLY)
Iron: 90 ug/dL (ref 28–170)
Saturation Ratios: 29 % (ref 10.4–31.8)
TIBC: 314 ug/dL (ref 250–450)
UIBC: 224 ug/dL (ref 148–442)

## 2022-06-30 LAB — FERRITIN: Ferritin: 56 ng/mL (ref 11–307)

## 2022-07-01 ENCOUNTER — Telehealth: Payer: Self-pay

## 2022-07-01 NOTE — Telephone Encounter (Signed)
Called and given below message. She verbalized understanding and is agreeable to appt in 1 year.

## 2022-07-01 NOTE — Telephone Encounter (Signed)
-----   Message from Heath Lark, MD sent at 06/30/2022  2:48 PM EDT ----- Her cbc and iron studies are ok She has not received any treatment since Feb 2022 I recommend appt in 1 year, if she agrees, I can put in LOS

## 2022-09-14 ENCOUNTER — Ambulatory Visit: Payer: Medicare Other

## 2022-09-15 ENCOUNTER — Other Ambulatory Visit: Payer: Self-pay | Admitting: Internal Medicine

## 2022-09-15 DIAGNOSIS — Z780 Asymptomatic menopausal state: Secondary | ICD-10-CM

## 2022-09-15 DIAGNOSIS — M810 Age-related osteoporosis without current pathological fracture: Secondary | ICD-10-CM

## 2022-09-18 ENCOUNTER — Ambulatory Visit
Admission: RE | Admit: 2022-09-18 | Discharge: 2022-09-18 | Disposition: A | Discharge status: Home / Self Care | Payer: Medicare Other | Source: Ambulatory Visit | Attending: Internal Medicine | Admitting: Internal Medicine

## 2022-09-18 DIAGNOSIS — Z780 Asymptomatic menopausal state: Secondary | ICD-10-CM

## 2022-09-18 DIAGNOSIS — Z1231 Encounter for screening mammogram for malignant neoplasm of breast: Secondary | ICD-10-CM

## 2023-06-22 ENCOUNTER — Telehealth: Payer: Self-pay

## 2023-06-22 NOTE — Telephone Encounter (Signed)
Returned her call and given transportation # to call to schedule for 9/20 appts. Sent transportation coordinator a message to call her to arrange.

## 2023-06-28 ENCOUNTER — Other Ambulatory Visit: Payer: Self-pay | Admitting: Hematology and Oncology

## 2023-06-28 DIAGNOSIS — D500 Iron deficiency anemia secondary to blood loss (chronic): Secondary | ICD-10-CM

## 2023-06-28 DIAGNOSIS — K5520 Angiodysplasia of colon without hemorrhage: Secondary | ICD-10-CM

## 2023-07-02 ENCOUNTER — Encounter: Payer: Self-pay | Admitting: Hematology and Oncology

## 2023-07-02 ENCOUNTER — Inpatient Hospital Stay: Payer: Medicare Other | Attending: Hematology and Oncology

## 2023-07-02 ENCOUNTER — Inpatient Hospital Stay (HOSPITAL_BASED_OUTPATIENT_CLINIC_OR_DEPARTMENT_OTHER): Payer: Medicare Other | Admitting: Hematology and Oncology

## 2023-07-02 VITALS — BP 134/58 | HR 72 | Temp 98.0°F | Resp 18 | Ht 62.0 in | Wt 187.0 lb

## 2023-07-02 DIAGNOSIS — R6889 Other general symptoms and signs: Secondary | ICD-10-CM | POA: Diagnosis not present

## 2023-07-02 DIAGNOSIS — K5520 Angiodysplasia of colon without hemorrhage: Secondary | ICD-10-CM

## 2023-07-02 DIAGNOSIS — D509 Iron deficiency anemia, unspecified: Secondary | ICD-10-CM | POA: Diagnosis present

## 2023-07-02 DIAGNOSIS — D500 Iron deficiency anemia secondary to blood loss (chronic): Secondary | ICD-10-CM

## 2023-07-02 LAB — IRON AND IRON BINDING CAPACITY (CC-WL,HP ONLY)
Iron: 72 ug/dL (ref 28–170)
Saturation Ratios: 23 % (ref 10.4–31.8)
TIBC: 321 ug/dL (ref 250–450)
UIBC: 249 ug/dL (ref 148–442)

## 2023-07-02 LAB — CBC WITH DIFFERENTIAL (CANCER CENTER ONLY)
Abs Immature Granulocytes: 0.01 10*3/uL (ref 0.00–0.07)
Basophils Absolute: 0 10*3/uL (ref 0.0–0.1)
Basophils Relative: 1 %
Eosinophils Absolute: 0.3 10*3/uL (ref 0.0–0.5)
Eosinophils Relative: 4 %
HCT: 43.7 % (ref 36.0–46.0)
Hemoglobin: 14.7 g/dL (ref 12.0–15.0)
Immature Granulocytes: 0 %
Lymphocytes Relative: 21 %
Lymphs Abs: 1.3 10*3/uL (ref 0.7–4.0)
MCH: 29.8 pg (ref 26.0–34.0)
MCHC: 33.6 g/dL (ref 30.0–36.0)
MCV: 88.6 fL (ref 80.0–100.0)
Monocytes Absolute: 0.5 10*3/uL (ref 0.1–1.0)
Monocytes Relative: 8 %
Neutro Abs: 4 10*3/uL (ref 1.7–7.7)
Neutrophils Relative %: 66 %
Platelet Count: 278 10*3/uL (ref 150–400)
RBC: 4.93 MIL/uL (ref 3.87–5.11)
RDW: 13.4 % (ref 11.5–15.5)
WBC Count: 6 10*3/uL (ref 4.0–10.5)
nRBC: 0 % (ref 0.0–0.2)

## 2023-07-02 LAB — FERRITIN: Ferritin: 65 ng/mL (ref 11–307)

## 2023-07-02 MED ORDER — VITAMIN B12 500 MCG PO TABS: 500.0000 ug | ORAL_TABLET | Freq: Every day | ORAL | Status: AC

## 2023-07-02 NOTE — Assessment & Plan Note (Signed)
The patient has significant issues related to an incident in 2022 The patient developed 1 episode of bleeding which she could not figure out whether is postmenopausal bleeding versus rectal bleeding CT imaging was done Ultimately, she underwent endoscopy evaluation After endoscopy, she developed severe pain on the right side The patient was convinced that she was injured during the procedure She did not want to go back to see her gastroenterologist because nobody can provide her with documentation and explanation as to why she hurt on the right side for 10 months after the procedure She was also referred to see a gynecologist and she received a bill that caused several hundred dollars which she has declined to pay According to the patient, her Pap smear was normal and she have no further bleeding since then She went on and discussed another incident related to phone call from the public health department related to screening modality After listening to the patient for over 25 minutes, I have to stop the patient

## 2023-07-02 NOTE — Progress Notes (Signed)
[facility] OFFICE PROGRESS NOTE  [name], MD  ASSESSMENT & PLAN:  Iron deficiency anemia She no longer have iron deficiency anemia Her blood counts and iron studies has been stable for over 2 years without IV iron I will see her once a year  Multiple complaints The patient has significant issues related to an incident in 2022 The patient developed 1 episode of bleeding which she could not figure out whether is postmenopausal bleeding versus rectal bleeding CT imaging was done Ultimately, she underwent endoscopy evaluation After endoscopy, she developed severe pain on the right side The patient was convinced that she was injured during the procedure She did not want to go back to see her gastroenterologist because nobody can provide her with documentation and explanation as to why she hurt on the right side for 10 months after the procedure She was also referred to see a gynecologist and she received a bill that caused several hundred dollars which she has declined to pay According to the patient, her Pap smear was normal and she have no further bleeding since then She went on and discussed another incident related to phone call from the public health department related to screening modality After listening to the patient for over 25 minutes, I have to stop the patient    Orders Placed This Encounter  Procedures   CBC with Differential (Cancer Center Only)    Standing Status:   Future    Standing Expiration Date:   07/01/2024   Ferritin    Standing Status:   Future    Standing Expiration Date:   07/01/2024   Iron and Iron Binding Capacity ([facility] only)    Standing Status:   Future    Standing Expiration Date:   07/01/2024    The total time spent in the appointment was 30 minutes encounter with patients including review of chart and various tests results, discussions about plan of care and coordination of care plan   All questions were answered. The patient  knows to call the clinic with any problems, questions or concerns. No barriers to learning was detected.    [name], MD 9/20/20243:23 PM  INTERVAL HISTORY: Melanie [name] 88 y.o. female returns for further follow-up for history of iron deficiency anemia She denies recent bleeding We discussed test results I politely listened to the patient for additional 25 minutes related to other complaints  SUMMARY OF HEMATOLOGIC HISTORY:  The patient has been complaining of fatigue and was found to have severe iron deficiency anemia. Approximately a year or 2 ago, she received 2 units of blood transfusion because of this. She had extensive GI evaluation with EGD and colonoscopy and was never found to have any sort of GI bleed. She has received 4 intravenous doses of iron infusion and the last infusion in 3 months ago. Bone marrow aspirate and biopsy in January 2013 showed no evidence of malignancy or persistent absence of iron stores. In 2014, she received 6 doses of intravenous iron infusion. She was referred to gastroenterology for further evaluation due to suspected GI bleed. Repeat GI endoscopy dated 12/26/2013 showed angiodysplastic lesion and moderate diverticulosis. The patient had received numerous intravenous iron infusion almost on a monthly basis from 03/15/2013 to 01/17/15. Her last intravenous iron was in July 2016, and then resumed in 2018 On April 16, 2020, she had repeat upper endoscopy - Normal mucosa was found in the entire esophagus. - A 10 cm hiatal hernia with multiple Cameron erosions  was found. The proximal extent of the gastric folds (end of tubular esophagus) was 30 cm from the incisors. The hiatal narrowing was 40 cm from the incisors. - Multiple erosions as above with adherent heme without active bleeding were found in the gastric body. Biopsies were taken with a cold forceps for histology and Helicobacter pylori testing. These are secondary to the large hiatal hernia. - Moderate  inflammation characterized by erythema was found in the duodenal bulb. - The second portion of the duodenum was normal. Her last dose of IV iron was on November 29, 2020 From 05/28/2021, gastric ulcer and duodenal ulcer was seen.  She underwent sigmoidoscopy which showed internal hemorrhoids and diverticulosis without signs of bleeding  I have reviewed the past medical history, past surgical history, social history and family history with the patient and they are unchanged from previous note.  ALLERGIES:  is allergic to cephalexin, clindamycin/lincomycin, codeine, levofloxacin, morphine and codeine, other, quinolones, sulfa antibiotics, tramadol, amoxicillin, fexofenadine hcl, floxin [ocuflox], neurontin [gabapentin], and septra [bactrim].  MEDICATIONS:  Current Outpatient Medications  Medication Sig Dispense Refill   Carboxymethylcellulose Sodium (EYE DROPS OP) Apply 1 drop to eye 3 (three) times daily. For dry eyes,     aspirin 81 MG tablet Take 81 mg by mouth daily.      cholecalciferol (VITAMIN D) 1000 units tablet Take 5,000 Units by mouth daily.     Cyanocobalamin (VITAMIN B12) 500 MCG TABS Take 500 mcg by mouth daily.     folic acid (FOLVITE) 1 MG tablet TAKE 1 TABLET BY MOUTH EVERY DAY 90 tablet 3   metoprolol (LOPRESSOR) 50 MG tablet Take 1 tablet (50 mg total) by mouth 2 (two) times daily. 180 tablet 1   polyethylene glycol powder (GLYCOLAX/MIRALAX) powder Take 17 g by mouth daily as needed. 1051 g 1   No current facility-administered medications for this visit.     REVIEW OF SYSTEMS:   Constitutional: Denies fevers, chills or night sweats Eyes: Denies blurriness of vision Ears, nose, mouth, throat, and face: Denies mucositis or sore throat Respiratory: Denies cough, dyspnea or wheezes Cardiovascular: Denies palpitation, chest discomfort or lower extremity swelling Gastrointestinal:  Denies nausea, heartburn or change in bowel habits Skin: Denies abnormal skin  rashes Lymphatics: Denies new lymphadenopathy or easy bruising Neurological:Denies numbness, tingling or new weaknesses Behavioral/Psych: Mood is stable, no new changes  All other systems were reviewed with the patient and are negative.  PHYSICAL EXAMINATION: ECOG PERFORMANCE STATUS: 0 - Asymptomatic  Vitals:   07/02/23 1131  BP: (!) 134/58  Pulse: 72  Resp: 18  Temp: 98 F (36.7 C)  SpO2: 97%   Filed Weights   07/02/23 1131  Weight: 187 lb (84.8 kg)    GENERAL:alert, no distress and comfortable   LABORATORY DATA:  I have reviewed the data as listed     Component Value Date/Time   NA 140 11/02/2016 1403   NA 144 02/12/2014 0849   K 4.3 11/02/2016 1403   K 3.6 02/12/2014 0849   CL 106 11/02/2016 1403   CL 109 (H) 12/23/2012 1100   CO2 28 11/02/2016 1403   CO2 24 02/12/2014 0849   GLUCOSE 99 11/02/2016 1403   GLUCOSE 152 (H) 02/12/2014 0849   GLUCOSE 106 (H) 12/23/2012 1100   BUN 18 11/02/2016 1403   BUN 15.4 02/12/2014 0849   CREATININE 0.80 12/26/2020 1021   CREATININE 0.8 02/12/2014 0849   CALCIUM 10.6 (H) 11/02/2016 1403   CALCIUM 10.7 (H) 02/12/2014 4098  PROT 7.1 08/22/2015 1041   PROT 6.3 (L) 02/12/2014 0849   ALBUMIN 4.1 08/22/2015 1041   ALBUMIN 3.4 (L) 02/12/2014 0849   AST 17 08/22/2015 1041   AST 13 02/12/2014 0849   ALT 16 08/22/2015 1041   ALT 8 02/12/2014 0849   ALKPHOS 89 08/22/2015 1041   ALKPHOS 78 02/12/2014 0849   BILITOT 0.5 08/22/2015 1041   BILITOT 0.25 02/12/2014 0849   GFRNONAA 80 (L) 08/22/2011 1205   GFRAA >90 08/22/2011 1205    No results found for: "SPEP", "UPEP"  Lab Results  Component Value Date   WBC 6.0 07/02/2023   NEUTROABS 4.0 07/02/2023   HGB 14.7 07/02/2023   HCT 43.7 07/02/2023   MCV 88.6 07/02/2023   PLT 278 07/02/2023      Chemistry      Component Value Date/Time   NA 140 11/02/2016 1403   NA 144 02/12/2014 0849   K 4.3 11/02/2016 1403   K 3.6 02/12/2014 0849   CL 106 11/02/2016 1403   CL 109  (H) 12/23/2012 1100   CO2 28 11/02/2016 1403   CO2 24 02/12/2014 0849   BUN 18 11/02/2016 1403   BUN 15.4 02/12/2014 0849   CREATININE 0.80 12/26/2020 1021   CREATININE 0.8 02/12/2014 0849      Component Value Date/Time   CALCIUM 10.6 (H) 11/02/2016 1403   CALCIUM 10.7 (H) 02/12/2014 0849   ALKPHOS 89 08/22/2015 1041   ALKPHOS 78 02/12/2014 0849   AST 17 08/22/2015 1041   AST 13 02/12/2014 0849   ALT 16 08/22/2015 1041   ALT 8 02/12/2014 0849   BILITOT 0.5 08/22/2015 1041   BILITOT 0.25 02/12/2014 0849

## 2023-07-02 NOTE — Assessment & Plan Note (Signed)
She no longer have iron deficiency anemia Her blood counts and iron studies has been stable for over 2 years without IV iron I will see her once a year

## 2023-08-12 ENCOUNTER — Other Ambulatory Visit: Payer: Self-pay | Admitting: Internal Medicine

## 2023-08-12 DIAGNOSIS — Z1231 Encounter for screening mammogram for malignant neoplasm of breast: Secondary | ICD-10-CM

## 2023-08-27 ENCOUNTER — Telehealth: Payer: Self-pay

## 2023-08-27 NOTE — Telephone Encounter (Signed)
Returned her call and will mail out 9/20 labs results per her request. Verified her address and will mail results per her request.

## 2023-09-22 ENCOUNTER — Ambulatory Visit
Admission: RE | Admit: 2023-09-22 | Discharge: 2023-09-22 | Disposition: A | Discharge status: Home / Self Care | Payer: Medicare Other | Source: Ambulatory Visit | Attending: Internal Medicine | Admitting: Internal Medicine

## 2023-09-22 DIAGNOSIS — Z1231 Encounter for screening mammogram for malignant neoplasm of breast: Secondary | ICD-10-CM

## 2024-06-23 ENCOUNTER — Telehealth: Payer: Self-pay

## 2024-06-23 NOTE — Telephone Encounter (Signed)
 LVM for patient clarifying upcoming appointments. Provided appointment date and times for patient. Provided call back number should she have any further questions.

## 2024-07-03 ENCOUNTER — Other Ambulatory Visit: Payer: Self-pay

## 2024-07-03 DIAGNOSIS — D500 Iron deficiency anemia secondary to blood loss (chronic): Secondary | ICD-10-CM

## 2024-07-04 ENCOUNTER — Encounter: Payer: Self-pay | Admitting: Hematology and Oncology

## 2024-07-04 ENCOUNTER — Inpatient Hospital Stay: Payer: Medicare Other | Attending: Hematology and Oncology

## 2024-07-04 ENCOUNTER — Encounter

## 2024-07-04 ENCOUNTER — Inpatient Hospital Stay (HOSPITAL_BASED_OUTPATIENT_CLINIC_OR_DEPARTMENT_OTHER): Payer: Medicare Other | Admitting: Hematology and Oncology

## 2024-07-04 VITALS — BP 151/76 | HR 69 | Temp 97.4°F | Resp 18 | Ht 62.0 in | Wt 191.2 lb

## 2024-07-04 DIAGNOSIS — K449 Diaphragmatic hernia without obstruction or gangrene: Secondary | ICD-10-CM | POA: Diagnosis not present

## 2024-07-04 DIAGNOSIS — D509 Iron deficiency anemia, unspecified: Secondary | ICD-10-CM | POA: Insufficient documentation

## 2024-07-04 DIAGNOSIS — K5520 Angiodysplasia of colon without hemorrhage: Secondary | ICD-10-CM | POA: Diagnosis not present

## 2024-07-04 DIAGNOSIS — D500 Iron deficiency anemia secondary to blood loss (chronic): Secondary | ICD-10-CM

## 2024-07-04 LAB — CBC WITH DIFFERENTIAL (CANCER CENTER ONLY)
Abs Immature Granulocytes: 0.01 K/uL (ref 0.00–0.07)
Basophils Absolute: 0 K/uL (ref 0.0–0.1)
Basophils Relative: 0 %
Eosinophils Absolute: 0.3 K/uL (ref 0.0–0.5)
Eosinophils Relative: 4 %
HCT: 42.6 % (ref 36.0–46.0)
Hemoglobin: 14.2 g/dL (ref 12.0–15.0)
Immature Granulocytes: 0 %
Lymphocytes Relative: 19 %
Lymphs Abs: 1.1 K/uL (ref 0.7–4.0)
MCH: 29.3 pg (ref 26.0–34.0)
MCHC: 33.3 g/dL (ref 30.0–36.0)
MCV: 87.8 fL (ref 80.0–100.0)
Monocytes Absolute: 0.4 K/uL (ref 0.1–1.0)
Monocytes Relative: 6 %
Neutro Abs: 4.2 K/uL (ref 1.7–7.7)
Neutrophils Relative %: 71 %
Platelet Count: 267 K/uL (ref 150–400)
RBC: 4.85 MIL/uL (ref 3.87–5.11)
RDW: 13.5 % (ref 11.5–15.5)
WBC Count: 6 K/uL (ref 4.0–10.5)
nRBC: 0 % (ref 0.0–0.2)

## 2024-07-04 LAB — FERRITIN: Ferritin: 123 ng/mL (ref 11–307)

## 2024-07-04 LAB — IRON AND IRON BINDING CAPACITY (CC-WL,HP ONLY)
Iron: 68 ug/dL (ref 28–170)
Saturation Ratios: 22 % (ref 10.4–31.8)
TIBC: 304 ug/dL (ref 250–450)
UIBC: 236 ug/dL (ref 148–442)

## 2024-07-04 NOTE — Assessment & Plan Note (Addendum)
 She had recent EGD performed on April 16, 2020  which confirmed signs of erosions/gastritis Biopsy was negative She had recurrent iron  deficiency anemia requiring numerous intravenous iron  infusions in the past Her last infusion was more than 2-1/2 years ago Her blood count is stable and iron  studies are adequate I recommend discharge from the clinic as the patient is thinking about relocating to [place]  She can just get her primary care doctor to check her CBC

## 2024-07-04 NOTE — Progress Notes (Signed)
   [facility] OFFICE PROGRESS NOTE  Austin Mutton, MD  ASSESSMENT & PLAN:  Assessment & Plan Angiodysplasia of colon She had recent EGD performed on April 16, 2020  which confirmed signs of erosions/gastritis Biopsy was negative She had recurrent iron  deficiency anemia requiring numerous intravenous iron  infusions in the past Her last infusion was more than 2-1/2 years ago Her blood count is stable and iron  studies are adequate I recommend discharge from the clinic as the patient is thinking about relocating to [place]  She can just get her primary care doctor to check her CBC    No orders of the defined types were placed in this encounter.   INTERVAL HISTORY: Patient returns for recurrent anemia Symptoms of anemia includes none We reviewed CBC result  SUMMARY OF HEMATOLOGIC HISTORY:  The patient has been complaining of fatigue and was found to have severe iron  deficiency anemia. Approximately a year or 2 ago, she received 2 units of blood transfusion because of this. She had extensive GI evaluation with EGD and colonoscopy and was never found to have any sort of GI bleed. She has received 4 intravenous doses of iron  infusion and the last infusion in 3 months ago. Bone marrow aspirate and biopsy in January 2013 showed no evidence of malignancy or persistent absence of iron  stores. In 2014, she received 6 doses of intravenous iron  infusion. She was referred to gastroenterology for further evaluation due to suspected GI bleed. Repeat GI endoscopy dated 12/26/2013 showed angiodysplastic lesion and moderate diverticulosis. The patient had received numerous intravenous iron  infusion almost on a monthly basis from 03/15/2013 to 01/17/15. Her last intravenous iron  was in July 2016, and then resumed in 2018 On April 16, 2020, she had repeat upper endoscopy - Normal mucosa was found in the entire esophagus. - A 10 cm hiatal hernia with multiple Cameron erosions was found. The proximal  extent of the gastric folds (end of tubular esophagus) was 30 cm from the incisors. The hiatal narrowing was 40 cm from the incisors. - Multiple erosions as above with adherent heme without active bleeding were found in the gastric body. Biopsies were taken with a cold forceps for histology and Helicobacter pylori testing. These are secondary to the large hiatal hernia. - Moderate inflammation characterized by erythema was found in the duodenal bulb. - The second portion of the duodenum was normal. Her last dose of IV iron  was on November 29, 2020 From 05/28/2021, gastric ulcer and duodenal ulcer was seen.  She underwent sigmoidoscopy which showed internal hemorrhoids and diverticulosis without signs of bleeding  Lab Results  Component Value Date   VITAMINB12 520 11/06/2011   FERRITIN 65 07/02/2023   HGB 14.2 07/04/2024   RBC 4.85 07/04/2024   Vitals:   07/04/24 1106  BP: (!) 151/76  Pulse: 69  Resp: 18  Temp: (!) 97.4 F (36.3 C)  SpO2: 98%

## 2024-07-05 ENCOUNTER — Ambulatory Visit (INDEPENDENT_AMBULATORY_CARE_PROVIDER_SITE_OTHER): Admitting: Otolaryngology

## 2024-07-05 ENCOUNTER — Encounter (INDEPENDENT_AMBULATORY_CARE_PROVIDER_SITE_OTHER): Payer: Self-pay | Admitting: Otolaryngology

## 2024-07-05 VITALS — BP 161/78 | HR 70 | Temp 97.4°F

## 2024-07-05 DIAGNOSIS — J310 Chronic rhinitis: Secondary | ICD-10-CM | POA: Diagnosis not present

## 2024-07-05 DIAGNOSIS — H6123 Impacted cerumen, bilateral: Secondary | ICD-10-CM | POA: Diagnosis not present

## 2024-07-05 DIAGNOSIS — R0981 Nasal congestion: Secondary | ICD-10-CM

## 2024-07-05 DIAGNOSIS — H9203 Otalgia, bilateral: Secondary | ICD-10-CM

## 2024-07-05 DIAGNOSIS — J343 Hypertrophy of nasal turbinates: Secondary | ICD-10-CM | POA: Diagnosis not present

## 2024-07-06 DIAGNOSIS — H9203 Otalgia, bilateral: Secondary | ICD-10-CM | POA: Insufficient documentation

## 2024-07-06 DIAGNOSIS — J310 Chronic rhinitis: Secondary | ICD-10-CM | POA: Insufficient documentation

## 2024-07-06 DIAGNOSIS — J343 Hypertrophy of nasal turbinates: Secondary | ICD-10-CM | POA: Insufficient documentation

## 2024-07-06 DIAGNOSIS — H6123 Impacted cerumen, bilateral: Secondary | ICD-10-CM | POA: Insufficient documentation

## 2024-07-06 NOTE — Progress Notes (Signed)
 Patient ID: Melanie [name], female   DOB: 1935-07-02, 89 y.o.   MRN: [id number]  Follow-up: Chronic nasal congestion, bilateral ear pain  HPI: The patient is an 89 year old female who returns today for her follow-up evaluation.  The patient has a history of chronic nasal congestion, secondary to nasal mucosal congestion and bilateral inferior turbinate hypertrophy.  She was treated with Flonase nasal spray and nasal saline irrigation.  The patient returns today complaining of more recurrent nasal congestion.  In addition, she also complains of intermittent bilateral ear pain.  She denies any otorrhea or recent change in her hearing.  Exam: General: Communicates without difficulty, well nourished, no acute distress. Head: Normocephalic, no evidence injury, no tenderness, facial buttresses intact without stepoff. Face/sinus: No tenderness to palpation and percussion. Facial movement is normal and symmetric. Eyes: PERRL, EOMI. No scleral icterus, conjunctivae clear. Neuro: CN II exam reveals vision grossly intact.  No nystagmus at any point of gaze. Ears: Auricles well formed without lesions.  Bilateral cerumen impaction.  Both TMJs are mildly tender to touch.  Nose: External evaluation reveals normal support and skin without lesions.  Dorsum is intact.  Anterior rhinoscopy reveals congested mucosa over anterior aspect of inferior turbinates and intact septum.  No purulence noted. Oral:  Oral cavity and oropharynx are intact, symmetric, without erythema or edema.  Mucosa is moist without lesions. Neck: Full range of motion without pain.  There is no significant lymphadenopathy.  No masses palpable.  Thyroid bed within normal limits to palpation.  Parotid glands and submandibular glands equal bilaterally without mass.  Trachea is midline. Neuro:  CN 2-12 grossly intact.   Procedure: Bilateral cerumen disimpaction Anesthesia: None Description: Under the operating microscope, the cerumen is carefully removed  with a combination of cerumen currette, alligator forceps, and suction catheters.  After the cerumen is removed, the TMs are noted to be normal.  No mass, erythema, or lesions. The patient tolerated the procedure well.    Assessment: 1.  Chronic rhinitis with nasal mucosal congestion and bilateral inferior turbinate hypertrophy. 2.  Bilateral cerumen impaction.  After the disimpaction procedure, both tympanic membranes and middle ear spaces are noted to be normal. 3.  No acute infection is noted today. 4.  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  Plan: 1.  The physical exam findings are reviewed with the patient. 2.  Otomicroscopy with bilateral cerumen disimpaction. 3.  Flonase nasal spray 2 sprays each nostril daily. 4.  The patient will follow-up with her dentist regarding her TMJ disorder. 5.  The patient will return for reevaluation in 6 months.

## 2024-09-12 ENCOUNTER — Emergency Department (HOSPITAL_COMMUNITY)

## 2024-09-12 ENCOUNTER — Encounter (HOSPITAL_COMMUNITY): Payer: Self-pay | Admitting: Internal Medicine

## 2024-09-12 ENCOUNTER — Inpatient Hospital Stay (HOSPITAL_COMMUNITY)
Admission: EM | Admit: 2024-09-12 | Discharge: 2024-09-14 | DRG: 690 | Disposition: A | Attending: Internal Medicine | Admitting: Internal Medicine

## 2024-09-12 ENCOUNTER — Other Ambulatory Visit: Payer: Self-pay

## 2024-09-12 DIAGNOSIS — Z87440 Personal history of urinary (tract) infections: Secondary | ICD-10-CM | POA: Diagnosis not present

## 2024-09-12 DIAGNOSIS — E860 Dehydration: Secondary | ICD-10-CM | POA: Diagnosis present

## 2024-09-12 DIAGNOSIS — Z9049 Acquired absence of other specified parts of digestive tract: Secondary | ICD-10-CM | POA: Diagnosis not present

## 2024-09-12 DIAGNOSIS — N23 Unspecified renal colic: Secondary | ICD-10-CM | POA: Diagnosis present

## 2024-09-12 DIAGNOSIS — Z6835 Body mass index (BMI) 35.0-35.9, adult: Secondary | ICD-10-CM | POA: Diagnosis not present

## 2024-09-12 DIAGNOSIS — Z9842 Cataract extraction status, left eye: Secondary | ICD-10-CM | POA: Diagnosis not present

## 2024-09-12 DIAGNOSIS — K449 Diaphragmatic hernia without obstruction or gangrene: Secondary | ICD-10-CM | POA: Diagnosis present

## 2024-09-12 DIAGNOSIS — N390 Urinary tract infection, site not specified: Secondary | ICD-10-CM | POA: Diagnosis present

## 2024-09-12 DIAGNOSIS — Z8249 Family history of ischemic heart disease and other diseases of the circulatory system: Secondary | ICD-10-CM | POA: Diagnosis not present

## 2024-09-12 DIAGNOSIS — Z7982 Long term (current) use of aspirin: Secondary | ICD-10-CM | POA: Diagnosis not present

## 2024-09-12 DIAGNOSIS — Z885 Allergy status to narcotic agent status: Secondary | ICD-10-CM | POA: Diagnosis not present

## 2024-09-12 DIAGNOSIS — R1013 Epigastric pain: Secondary | ICD-10-CM | POA: Diagnosis not present

## 2024-09-12 DIAGNOSIS — N202 Calculus of kidney with calculus of ureter: Secondary | ICD-10-CM | POA: Diagnosis present

## 2024-09-12 DIAGNOSIS — H9190 Unspecified hearing loss, unspecified ear: Secondary | ICD-10-CM | POA: Diagnosis present

## 2024-09-12 DIAGNOSIS — Z881 Allergy status to other antibiotic agents status: Secondary | ICD-10-CM | POA: Diagnosis not present

## 2024-09-12 DIAGNOSIS — I10 Essential (primary) hypertension: Secondary | ICD-10-CM | POA: Diagnosis present

## 2024-09-12 DIAGNOSIS — K259 Gastric ulcer, unspecified as acute or chronic, without hemorrhage or perforation: Secondary | ICD-10-CM | POA: Diagnosis present

## 2024-09-12 DIAGNOSIS — Z9841 Cataract extraction status, right eye: Secondary | ICD-10-CM | POA: Diagnosis not present

## 2024-09-12 DIAGNOSIS — Z791 Long term (current) use of non-steroidal anti-inflammatories (NSAID): Secondary | ICD-10-CM | POA: Diagnosis not present

## 2024-09-12 DIAGNOSIS — Z882 Allergy status to sulfonamides status: Secondary | ICD-10-CM | POA: Diagnosis not present

## 2024-09-12 DIAGNOSIS — Z833 Family history of diabetes mellitus: Secondary | ICD-10-CM | POA: Diagnosis not present

## 2024-09-12 DIAGNOSIS — K257 Chronic gastric ulcer without hemorrhage or perforation: Secondary | ICD-10-CM | POA: Diagnosis not present

## 2024-09-12 DIAGNOSIS — Z8711 Personal history of peptic ulcer disease: Secondary | ICD-10-CM | POA: Diagnosis not present

## 2024-09-12 DIAGNOSIS — Z79899 Other long term (current) drug therapy: Secondary | ICD-10-CM | POA: Diagnosis not present

## 2024-09-12 DIAGNOSIS — R109 Unspecified abdominal pain: Secondary | ICD-10-CM

## 2024-09-12 DIAGNOSIS — M810 Age-related osteoporosis without current pathological fracture: Secondary | ICD-10-CM | POA: Diagnosis present

## 2024-09-12 DIAGNOSIS — E66812 Obesity, class 2: Secondary | ICD-10-CM | POA: Diagnosis present

## 2024-09-12 LAB — COMPREHENSIVE METABOLIC PANEL WITH GFR
ALT: 16 U/L (ref 0–44)
AST: 16 U/L (ref 15–41)
Albumin: 3.3 g/dL — ABNORMAL LOW (ref 3.5–5.0)
Alkaline Phosphatase: 95 U/L (ref 38–126)
Anion gap: 8 (ref 5–15)
BUN: 25 mg/dL — ABNORMAL HIGH (ref 8–23)
CO2: 26 mmol/L (ref 22–32)
Calcium: 11 mg/dL — ABNORMAL HIGH (ref 8.9–10.3)
Chloride: 105 mmol/L (ref 98–111)
Creatinine, Ser: 0.91 mg/dL (ref 0.44–1.00)
GFR, Estimated: >60 mL/min (ref >60)
Glucose, Bld: 131 mg/dL — ABNORMAL HIGH (ref 70–99)
Potassium: 4 mmol/L (ref 3.5–5.1)
Sodium: 139 mmol/L (ref 135–145)
Total Bilirubin: 0.7 mg/dL (ref 0.0–1.2)
Total Protein: 6.7 g/dL (ref 6.5–8.1)

## 2024-09-12 LAB — URINALYSIS, MICROSCOPIC (REFLEX): WBC, UA: >50 WBC/hpf (ref 0–5)

## 2024-09-12 LAB — I-STAT CHEM 8, ED
BUN: 25 mg/dL — ABNORMAL HIGH (ref 8–23)
Calcium, Ion: 1.47 mmol/L — ABNORMAL HIGH (ref 1.15–1.40)
Chloride: 106 mmol/L (ref 98–111)
Creatinine, Ser: 0.9 mg/dL (ref 0.44–1.00)
Glucose, Bld: 127 mg/dL — ABNORMAL HIGH (ref 70–99)
HCT: 41 % (ref 36.0–46.0)
Hemoglobin: 13.9 g/dL (ref 12.0–15.0)
Potassium: 4 mmol/L (ref 3.5–5.1)
Sodium: 141 mmol/L (ref 135–145)
TCO2: 24 mmol/L (ref 22–32)

## 2024-09-12 LAB — URINALYSIS, ROUTINE W REFLEX MICROSCOPIC
Bilirubin Urine: NEGATIVE
Glucose, UA: NEGATIVE mg/dL
Ketones, ur: NEGATIVE mg/dL
Nitrite: POSITIVE — AB
Protein, ur: 100 mg/dL — AB
Specific Gravity, Urine: 1.025 (ref 1.005–1.030)
pH: 6 (ref 5.0–8.0)

## 2024-09-12 LAB — CBC
HCT: 44.2 % (ref 36.0–46.0)
Hemoglobin: 14.5 g/dL (ref 12.0–15.0)
MCH: 29.1 pg (ref 26.0–34.0)
MCHC: 32.8 g/dL (ref 30.0–36.0)
MCV: 88.8 fL (ref 80.0–100.0)
Platelets: 287 K/uL (ref 150–400)
RBC: 4.98 MIL/uL (ref 3.87–5.11)
RDW: 13.2 % (ref 11.5–15.5)
WBC: 7.2 K/uL (ref 4.0–10.5)
nRBC: 0 % (ref 0.0–0.2)

## 2024-09-12 LAB — VITAMIN D 25 HYDROXY (VIT D DEFICIENCY, FRACTURES): Vit D, 25-Hydroxy: 46 ng/mL (ref 30–100)

## 2024-09-12 LAB — PHOSPHORUS: Phosphorus: 2.4 mg/dL — ABNORMAL LOW (ref 2.5–4.6)

## 2024-09-12 LAB — I-STAT CG4 LACTIC ACID, ED: Lactic Acid, Venous: 1 mmol/L (ref 0.5–1.9)

## 2024-09-12 LAB — LIPASE, BLOOD: Lipase: 19 U/L (ref 11–51)

## 2024-09-12 LAB — MAGNESIUM: Magnesium: 2.1 mg/dL (ref 1.7–2.4)

## 2024-09-12 MED ORDER — PANTOPRAZOLE SODIUM 40 MG IV SOLR
40.0000 mg | Freq: Two times a day (BID) | INTRAVENOUS | Status: DC
  Administered 2024-09-13 – 2024-09-14 (×3): 40 mg via INTRAVENOUS
  Filled 2024-09-12 (×3): qty 10

## 2024-09-12 MED ORDER — BISACODYL 5 MG PO TBEC: 5.0000 mg | DELAYED_RELEASE_TABLET | Freq: Every day | ORAL | Status: DC | PRN

## 2024-09-12 MED ORDER — VITAMIN D 25 MCG (1000 UNIT) PO TABS
5000.0000 [IU] | ORAL_TABLET | Freq: Every day | ORAL | Status: DC
  Administered 2024-09-13 – 2024-09-14 (×2): 5000 [IU] via ORAL
  Filled 2024-09-12 (×3): qty 5

## 2024-09-12 MED ORDER — POLYETHYLENE GLYCOL 3350 17 G PO PACK
17.0000 g | PACK | Freq: Every day | ORAL | Status: DC
  Administered 2024-09-12 – 2024-09-13 (×2): 17 g via ORAL
  Filled 2024-09-12 (×2): qty 1

## 2024-09-12 MED ORDER — ONDANSETRON HCL 4 MG PO TABS: 4.0000 mg | ORAL_TABLET | Freq: Four times a day (QID) | ORAL | Status: DC | PRN

## 2024-09-12 MED ORDER — POLYETHYLENE GLYCOL 3350 17 GM/SCOOP PO POWD
17.0000 g | Freq: Every day | ORAL | Status: DC
  Filled 2024-09-12: qty 119

## 2024-09-12 MED ORDER — SODIUM CHLORIDE 0.9 % IV SOLN
1.0000 g | Freq: Once | INTRAVENOUS | Status: AC
  Administered 2024-09-12: 1 g via INTRAVENOUS
  Filled 2024-09-12: qty 10

## 2024-09-12 MED ORDER — SODIUM CHLORIDE 0.9 % IV BOLUS
500.0000 mL | Freq: Once | INTRAVENOUS | Status: AC
  Administered 2024-09-12: 500 mL via INTRAVENOUS

## 2024-09-12 MED ORDER — ACETAMINOPHEN 325 MG PO TABS: 650.0000 mg | ORAL_TABLET | Freq: Four times a day (QID) | ORAL | Status: DC | PRN

## 2024-09-12 MED ORDER — ACETAMINOPHEN 650 MG RE SUPP: 650.0000 mg | Freq: Four times a day (QID) | RECTAL | Status: DC | PRN

## 2024-09-12 MED ORDER — METOPROLOL TARTRATE 50 MG PO TABS
50.0000 mg | ORAL_TABLET | Freq: Two times a day (BID) | ORAL | Status: DC
  Administered 2024-09-12 – 2024-09-14 (×4): 50 mg via ORAL
  Filled 2024-09-12 (×5): qty 1

## 2024-09-12 MED ORDER — HEPARIN SODIUM (PORCINE) 5000 UNIT/ML IJ SOLN
5000.0000 [IU] | Freq: Two times a day (BID) | INTRAMUSCULAR | Status: DC
  Administered 2024-09-12 – 2024-09-14 (×5): 5000 [IU] via SUBCUTANEOUS
  Filled 2024-09-12 (×5): qty 1

## 2024-09-12 MED ORDER — ONDANSETRON HCL 4 MG/2ML IJ SOLN: 4.0000 mg | Freq: Four times a day (QID) | INTRAMUSCULAR | Status: DC | PRN

## 2024-09-12 MED ORDER — VITAMIN B-12 1000 MCG PO TABS
500.0000 ug | ORAL_TABLET | Freq: Every day | ORAL | Status: DC
  Administered 2024-09-12 – 2024-09-14 (×3): 500 ug via ORAL
  Filled 2024-09-12 (×4): qty 1

## 2024-09-12 MED ORDER — HYDRALAZINE HCL 20 MG/ML IJ SOLN: 10.0000 mg | Freq: Four times a day (QID) | INTRAMUSCULAR | Status: DC | PRN

## 2024-09-12 MED ORDER — FOLIC ACID 1 MG PO TABS
1.0000 mg | ORAL_TABLET | Freq: Every day | ORAL | Status: DC
  Administered 2024-09-12 – 2024-09-14 (×3): 1 mg via ORAL
  Filled 2024-09-12 (×4): qty 1

## 2024-09-12 MED ORDER — IOHEXOL 350 MG/ML SOLN
75.0000 mL | Freq: Once | INTRAVENOUS | Status: AC | PRN
  Administered 2024-09-12: 75 mL via INTRAVENOUS

## 2024-09-12 MED ORDER — SODIUM CHLORIDE 0.9 % IV SOLN: INTRAVENOUS | Status: AC

## 2024-09-12 MED ORDER — SODIUM CHLORIDE 0.9 % IV SOLN
1.0000 g | INTRAVENOUS | Status: DC
  Administered 2024-09-13: 1 g via INTRAVENOUS
  Filled 2024-09-12: qty 10

## 2024-09-12 MED ORDER — KETOROLAC TROMETHAMINE 15 MG/ML IJ SOLN
15.0000 mg | Freq: Once | INTRAMUSCULAR | Status: AC
  Administered 2024-09-12: 15 mg via INTRAVENOUS
  Filled 2024-09-12: qty 1

## 2024-09-12 MED ORDER — PANTOPRAZOLE SODIUM 40 MG IV SOLR
80.0000 mg | Freq: Once | INTRAVENOUS | Status: AC
  Administered 2024-09-12: 80 mg via INTRAVENOUS
  Filled 2024-09-12: qty 20

## 2024-09-12 MED ORDER — TETRAHYDROZOLINE HCL 0.05 % OP SOLN
1.0000 [drp] | Freq: Three times a day (TID) | OPHTHALMIC | Status: DC
  Administered 2024-09-12 – 2024-09-14 (×5): 1 [drp] via OPHTHALMIC
  Filled 2024-09-12: qty 15

## 2024-09-12 NOTE — ED Notes (Signed)
 Charge RN of [facility] called, no response from phone. Floor of [facility] called for rolling call, no response.

## 2024-09-12 NOTE — ED Provider Notes (Signed)
 [facility] AT [facility] Provider Note   CSN: [id number] Arrival date & time: 09/12/24  9175     Patient presents with: Abdominal Pain   Melanie [name] is a 89 y.o. female.   89 year old female with prior medical history as detailed below presents for evaluation.  Patient reports intermittent and persistent epigastric abdominal pain x 2 to 3 months.  She reports that she will occasionally have trouble not burping or not passing gas.  She denies fever.  She denies vomiting.  She denies current abdominal pain.  She denies other complaint.  Daughter at bedside reports the patient has had increased pain for the last 2 days.  Patient is scheduled for urologic evaluation and procedure next week (with Dr. Selma, possible cystoscopy) given history of significant and frequent UTIs.  Patient apparently has significant issues with compliance with any and all prescribed antibiotics secondary to concerns over possible side effects and reactions.  The history is provided by the patient and medical records.       Prior to Admission medications   Medication Sig Start Date End Date Taking? Authorizing Provider  aspirin 81 MG tablet Take 81 mg by mouth daily.     [provider]  Carboxymethylcellulose Sodium (EYE DROPS OP) Apply 1 drop to eye 3 (three) times daily. For dry eyes,    [provider]  cholecalciferol (VITAMIN D ) 1000 units tablet Take 5,000 Units by mouth daily.    [provider]  Cyanocobalamin (VITAMIN B12) 500 MCG TABS Take 500 mcg by mouth daily. 07/02/23   Lonn Hicks, MD  folic acid  (FOLVITE ) 1 MG tablet TAKE 1 TABLET BY MOUTH EVERY DAY 09/23/20   Lonn Hicks, MD  meloxicam (MOBIC) 15 MG tablet Take 15 mg by mouth daily.    [provider]  metoprolol  (LOPRESSOR ) 50 MG tablet Take 1 tablet (50 mg total) by mouth 2 (two) times daily. 11/02/16   Jordan, [name], MD  polyethylene glycol powder (GLYCOLAX /MIRALAX )  powder Take 17 g by mouth daily as needed. 07/15/18   Lonn Hicks, MD  tacrolimus (PROTOPIC) 0.1 % ointment Apply 1 Application topically daily. 05/10/24   [provider]    Allergies: Cephalexin, Clindamycin/lincomycin, Codeine, Levofloxacin, Morphine  and codeine, Other, Quinolones, Sulfa antibiotics, Tramadol, Amoxicillin, Fexofenadine hcl, Floxin [ocuflox], Neurontin [gabapentin], and Septra [bactrim]    Review of Systems  All other systems reviewed and are negative.   Updated Vital Signs BP (!) 159/114 (BP Location: Left Arm)   Pulse 97   Temp 97.9 F (36.6 C)   Resp 16   SpO2 98%   Physical Exam Vitals and nursing note reviewed.  Constitutional:      General: She is not in acute distress.    Appearance: She is well-developed.  HENT:     Head: Normocephalic and atraumatic.  Eyes:     Conjunctiva/sclera: Conjunctivae normal.  Cardiovascular:     Rate and Rhythm: Normal rate and regular rhythm.     Heart sounds: No murmur heard. Pulmonary:     Effort: Pulmonary effort is normal. No respiratory distress.     Breath sounds: Normal breath sounds.  Abdominal:     Palpations: Abdomen is soft.     Tenderness: There is no abdominal tenderness.  Musculoskeletal:        General: No swelling.     Cervical back: Neck supple.  Skin:    General: Skin is warm and dry.     Capillary Refill: Capillary  refill takes less than 2 seconds.  Neurological:     Mental Status: She is alert.  Psychiatric:        Mood and Affect: Mood normal.     (all labs ordered are listed, but only abnormal results are displayed) Labs Reviewed  COMPREHENSIVE METABOLIC PANEL WITH GFR - Abnormal; Notable for the following components:      Result Value   Glucose, Bld 131 (*)    BUN 25 (*)    Calcium  11.0 (*)    Albumin 3.3 (*)    All other components within normal limits  URINALYSIS, ROUTINE W REFLEX MICROSCOPIC - Abnormal; Notable for the following components:   APPearance CLOUDY (*)     Hgb urine dipstick LARGE (*)    Protein, ur 100 (*)    Nitrite POSITIVE (*)    Leukocytes,Ua MODERATE (*)    All other components within normal limits  URINALYSIS, MICROSCOPIC (REFLEX) - Abnormal; Notable for the following components:   Bacteria, UA MANY (*)    All other components within normal limits  I-STAT CHEM 8, ED - Abnormal; Notable for the following components:   BUN 25 (*)    Glucose, Bld 127 (*)    Calcium , Ion 1.47 (*)    All other components within normal limits  LIPASE, BLOOD  CBC  I-STAT CG4 LACTIC ACID, ED    EKG: None  Radiology: CT ABDOMEN PELVIS W CONTRAST Result Date: 09/12/2024 CLINICAL DATA:  Diffuse abdominal pain. EXAM: CT ABDOMEN AND PELVIS WITH CONTRAST TECHNIQUE: Multidetector CT imaging of the abdomen and pelvis was performed using the standard protocol following bolus administration of intravenous contrast. RADIATION DOSE REDUCTION: This exam was performed according to the departmental dose-optimization program which includes automated exposure control, adjustment of the mA and/or kV according to patient size and/or use of iterative reconstruction technique. CONTRAST:  75mL OMNIPAQUE  IOHEXOL  350 MG/ML SOLN COMPARISON:  12/26/2020 FINDINGS: Lower chest: Stable borderline cardiomegaly. Stable large hiatal hernia. Minimal calcified plaque over the descending thoracic aorta. Visualized lung bases demonstrate a tiny amount of left pleural fluid. Hepatobiliary: Previous cholecystectomy. Liver and biliary tree are unremarkable. Pancreas: Normal. Spleen: Normal. Adrenals/Urinary Tract: Adrenal glands are normal. Kidneys are normal size. Possible punctate nonobstructing stone over the upper pole left kidney. Possible punctate stone over the lower pole right kidney. There is mild dilatation of the right intrarenal collecting system and renal pelvis as there is a 1 cm stone over the right renal pelvis causing this low-grade obstruction. There is an adjacent punctate stone  also over the right renal pelvis as well as 1-2 mm stone over the proximal right ureter. 4-5 mm over the right mid ureter. Additional 2 mm stone over the mid to distal right ureter. Left ureter and bladder are normal. Stomach/Bowel: Large hiatal hernia unchanged as described above. Possible ulcer of the gastric antrum with ill definition of the adjacent gastric wall. Small bowel is unremarkable. Appendix is normal. Mild diverticulosis of the descending and sigmoid colon without active inflammation. Vascular/Lymphatic: Calcified plaque over the abdominal aorta which is otherwise normal in caliber. Remaining vascular structures are unremarkable. No adenopathy. Reproductive: Uterus and bilateral adnexa are unremarkable. Other: No free fluid or focal inflammatory change. Single surgical clip over the anterior left mid abdomen. Musculoskeletal: No acute abnormality. IMPRESSION: 1. Mild dilatation of the right intrarenal collecting system and renal pelvis as there is a 1 cm stone over the right renal pelvis. Additional 1-2 mm stone over the proximal right ureter and 4-5 mm  stone over the right mid ureter as the stones likely account for patient's low-grade obstruction. 2 mm stone projects in the region of the mid to distal right ureter which may be an additional ureteral stone versus phlebolith. Possible punctate nonobstructing stone over the upper pole left kidney and lower pole right kidney. 2. Possible ulcer of the gastric antrum with ill definition of the adjacent gastric wall. Recommend correlation with endoscopy. 3. Stable large hiatal hernia. 4. Mild colonic diverticulosis without active inflammation. 5. Aortic atherosclerosis. Aortic Atherosclerosis (ICD10-I70.0). Electronically Signed   By: Toribio Agreste M.D.   On: 09/12/2024 11:07     Procedures   Medications Ordered in the ED - No data to display                                  Medical Decision Making Patient presented with nonspecific upper  abdominal pain.  Patient with known history of gastritis, gastric ulcer.  Patient without documented prior history of renal stones.  CT imaging suggest obstructive renal stones on the right.  Additionally UA is suggestive of concurrent infection.  Patient is nontoxic in appearance.  Urology is aware of case and has consulted and will follow as an inpatient.  Patient would benefit from IV antibiotics.  Patient would benefit from overnight observation to ensure compliance with antibiotics and also to make sure that she does not decompensate given presence of ureteral stones and UTI.  Patient's pain is likely related to known history of gastritis and gastric ulcer.  Patient would benefit from admission.  Admitting team made aware of case.  Amount and/or Complexity of Data Reviewed Labs: ordered. Radiology: ordered.  Risk Prescription drug management. Decision regarding hospitalization.        Final diagnoses:  Urinary tract infection without hematuria, site unspecified  Abdominal pain, unspecified abdominal location  Renal colic    ED Discharge Orders     None          Laurice Maude BROCKS, MD 09/12/24 1410

## 2024-09-12 NOTE — H&P (Signed)
 History and Physical    Patient: Melanie [name] FMW:[id number] DOB: 29-Jul-1935 DOA: 09/12/2024 DOS: the patient was seen and examined on 09/12/2024 . PCP: Austin Mutton, MD  Patient coming from: Home Chief complaint: Chief Complaint  Patient presents with   Abdominal Pain   HPI:  Melanie [name] is a 89 y.o. female with past medical history  of  89 year old female with past medical history of iron  deficiency anemia that has been stable over the past 2 years without IV iron  infusion being followed by hematology Dr. Lonn, has a history of endoscopy and colonoscopy with Dr. Albertus which showed severe inflammation multiple linear duodenal ulcers gastric ulcers, being admitted today for epigastric pain and found to have incidental nephrolithiasis and UTI.  Pt has h/o refusing recommendation and having reservations with treatment plan in past. Has currently refused GI evaluation for her gastric ulcer.     ED Course:  Vital signs in the ED were notable for the following:  Vitals:   09/12/24 1528 09/12/24 1537 09/12/24 1605 09/12/24 1608  BP:  (!) 143/78 (!) 160/85 (!) 160/85  Pulse: 83 86 93 90  Temp:  97.6 F (36.4 C) 97.9 F (36.6 C) 97.9 F (36.6 C)  Resp: 17 20 16 18   Height:   5' 2 (1.575 m)   SpO2: 100% 100% 95% 100%  TempSrc:  Oral Oral Oral   >>ED evaluation thus far shows: - CMP showing glucose 131 BUN of 25 normal kidney function, LFTs. - Lactic acid is within normal limits. - CBC is within normal limits. - Urinalysis today shows large hemoglobin nitrite positive moderate leukocytes more than 50 WBCs. - CT abdomen and pelvis shows large hiatal hernia, gastric antrum ulcer, right         >>While in the ED patient received the following: Medications  cefTRIAXone (ROCEPHIN) 1 g in sodium chloride  0.9 % 100 mL IVPB (has no administration in time range)  0.9 %  sodium chloride  infusion (has no administration in time range)  polyethylene glycol powder  (GLYCOLAX /MIRALAX ) container 17 g (has no administration in time range)  pantoprazole  (PROTONIX ) injection 80 mg (has no administration in time range)  pantoprazole  (PROTONIX ) injection 40 mg (has no administration in time range)  ketorolac (TORADOL) 15 MG/ML injection 15 mg (15 mg Intravenous Given 09/12/24 0953)  sodium chloride  0.9 % bolus 500 mL (500 mLs Intravenous New Bag/Given 09/12/24 0957)  iohexol  (OMNIPAQUE ) 350 MG/ML injection 75 mL (75 mLs Intravenous Contrast Given 09/12/24 1036)   Review of Systems  Constitutional:  Negative for chills and fever.  Gastrointestinal:  Positive for abdominal pain.  Genitourinary:  Positive for flank pain. Negative for dysuria and hematuria.   Past Medical History:  Diagnosis Date   Allergy    Anemia    Arthritis    right hip is most painful and affects mobility as far as ambulating steps   AVM (arteriovenous malformation)    of cecum   Cataract    Complication of anesthesia    pt states she had memory problems for several days after   Diverticula of colon    Duodenal ulcer 2015   x 4 nonbleeding   Duodenitis    Hearing loss    Hiatal hernia    Hypercalcemia 08/18/2013   Hypertension    Iron  deficiency anemia, unspecified 12/25/2012   Past Surgical History:  Procedure Laterality Date   CATARACT EXTRACTION Bilateral    2008   CHOLECYSTECTOMY     laparoscopic  COLONOSCOPY N/A 12/26/2013   Procedure: COLONOSCOPY;  Surgeon: Gordy CHRISTELLA Starch, MD;  Location: [facility];  Service: Gastroenterology;  Laterality: N/A;   ESOPHAGOGASTRODUODENOSCOPY N/A 12/26/2013   Procedure: ESOPHAGOGASTRODUODENOSCOPY (EGD);  Surgeon: Gordy CHRISTELLA Starch, MD;  Location: [facility];  Service: Gastroenterology;  Laterality: N/A;   GIVENS CAPSULE STUDY N/A 12/26/2013   Procedure: GIVENS CAPSULE STUDY;  Surgeon: Gordy CHRISTELLA Starch, MD;  Location: [facility];  Service: Gastroenterology;  Laterality: N/A;  Due to inablility to swallow capsule, will place Endoscopically into  Small Bowel.   HOT HEMOSTASIS N/A 12/26/2013   Procedure: HOT HEMOSTASIS (ARGON PLASMA COAGULATION/BICAP);  Surgeon: Gordy CHRISTELLA Starch, MD;  Location: [facility];  Service: Gastroenterology;  Laterality: N/A;   TONSILLECTOMY      reports that she has never smoked. She has never used smokeless tobacco. She reports that she does not drink alcohol and does not use drugs. Allergies  Allergen Reactions   Cephalexin Other (See Comments)    Affected pt's child -  Therefore , pt does not take Keflex.   Clindamycin/Lincomycin    Codeine Nausea And Vomiting   Levofloxacin Other (See Comments)    Hallucinations.   Morphine  And Codeine Nausea And Vomiting   Other     Another antibiotic that starts with cina?   Quinolones Other (See Comments)    Hallucinations.   Sulfa Antibiotics Nausea And Vomiting   Tramadol Nausea And Vomiting   Amoxicillin Nausea Only   Fexofenadine Hcl Other (See Comments)    unknown reaction   Floxin [Ocuflox] Other (See Comments)    unknown allergy   Neurontin [Gabapentin] Palpitations   Septra [Bactrim] Other (See Comments)    Unknown reaction   Family History  Problem Relation Age of Onset   Asthma Other    Hypertension Other    Hyperlipidemia Other    Stroke Other    Diabetes Mother    Heart disease Mother    Diverticulosis Mother    Heart disease Father    Breast cancer Daughter 14   Colon cancer Neg Hx    Rectal cancer Neg Hx    Stomach cancer Neg Hx    Esophageal cancer Neg Hx    Prior to Admission medications   Medication Sig Start Date End Date Taking? Authorizing Provider  aspirin 81 MG tablet Take 81 mg by mouth daily.     [provider]  Carboxymethylcellulose Sodium (EYE DROPS OP) Apply 1 drop to eye 3 (three) times daily. For dry eyes,    [provider]  cholecalciferol  (VITAMIN D ) 1000 units tablet Take 5,000 Units by mouth daily.    [provider]  Cyanocobalamin  (VITAMIN B12) 500 MCG TABS Take 500 mcg by mouth  daily. 07/02/23   Lonn Hicks, MD  folic acid  (FOLVITE ) 1 MG tablet TAKE 1 TABLET BY MOUTH EVERY DAY 09/23/20   Lonn Hicks, MD  meloxicam (MOBIC) 15 MG tablet Take 15 mg by mouth daily.    [provider]  metoprolol  (LOPRESSOR ) 50 MG tablet Take 1 tablet (50 mg total) by mouth 2 (two) times daily. 11/02/16   Jordan, [name], MD  polyethylene glycol powder (GLYCOLAX /MIRALAX ) powder Take 17 g by mouth daily as needed. 07/15/18   Lonn Hicks, MD  tacrolimus (PROTOPIC) 0.1 % ointment Apply 1 Application topically daily. 05/10/24   [provider]  Vitals:   09/12/24 1528 09/12/24 1537 09/12/24 1605 09/12/24 1608  BP:  (!) 143/78 (!) 160/85 (!) 160/85  Pulse: 83 86 93 90  Resp: 17 20 16 18   Temp:  97.6 F (36.4 C) 97.9 F (36.6 C) 97.9 F (36.6 C)  TempSrc:  Oral Oral Oral  SpO2: 100% 100% 95% 100%  Height:   5' 2 (1.575 m)    Physical Exam Vitals reviewed.  Constitutional:      General: She is not in acute distress.    Appearance: She is obese. She is not ill-appearing.  HENT:     Head: Normocephalic and atraumatic.  Eyes:     Extraocular Movements: Extraocular movements intact.  Cardiovascular:     Rate and Rhythm: Normal rate and regular rhythm.     Pulses: Normal pulses.     Heart sounds: Normal heart sounds.  Pulmonary:     Effort: Pulmonary effort is normal.     Breath sounds: Normal breath sounds.  Abdominal:     General: There is no distension.     Palpations: Abdomen is soft.     Tenderness: There is abdominal tenderness in the epigastric area.  Musculoskeletal:     Right lower leg: No edema.     Left lower leg: No edema.  Neurological:     General: No focal deficit present.     Mental Status: She is alert and oriented to person, place, and time.     Labs on Admission: I have personally reviewed following labs and imaging studies CBC: Recent Labs  Lab 09/12/24 0900  09/12/24 0937  WBC 7.2  --   HGB 14.5 13.9  HCT 44.2 41.0  MCV 88.8  --   PLT 287  --    Basic Metabolic Panel: Recent Labs  Lab 09/12/24 0900 09/12/24 0937 09/12/24 1450  NA 139 141  --   K 4.0 4.0  --   CL 105 106  --   CO2 26  --   --   GLUCOSE 131* 127*  --   BUN 25* 25*  --   CREATININE 0.91 0.90  --   CALCIUM  11.0*  --   --   MG  --   --  2.1  PHOS  --   --  2.4*   GFR: CrCl cannot be calculated (Unknown ideal weight.). Liver Function Tests: Recent Labs  Lab 09/12/24 0900  AST 16  ALT 16  ALKPHOS 95  BILITOT 0.7  PROT 6.7  ALBUMIN 3.3*   Recent Labs  Lab 09/12/24 0900  LIPASE 19   No results for input(s): AMMONIA in the last 168 hours. Recent Labs    09/12/24 0900 09/12/24 0937  BUN 25* 25*  CREATININE 0.91 0.90    Cardiac Enzymes: No results for input(s): CKTOTAL, CKMB, CKMBINDEX, TROPONINI in the last 168 hours. BNP (last 3 results) No results for input(s): PROBNP in the last 8760 hours. HbA1C: No results for input(s): HGBA1C in the last 72 hours. CBG: No results for input(s): GLUCAP in the last 168 hours. Lipid Profile: No results for input(s): CHOL, HDL, LDLCALC, TRIG, CHOLHDL, LDLDIRECT in the last 72 hours. Thyroid Function Tests: No results for input(s): TSH, T4TOTAL, FREET4, T3FREE, THYROIDAB in the last 72 hours. Anemia Panel: No results for input(s): VITAMINB12, FOLATE, FERRITIN, TIBC, IRON , RETICCTPCT in the last 72 hours. Urine analysis:    Component Value Date/Time   COLORURINE YELLOW 09/12/2024 1127   APPEARANCEUR CLOUDY (A) 09/12/2024 1127   LABSPEC  1.025 09/12/2024 1127   LABSPEC 1.005 11/25/2011 1408   PHURINE 6.0 09/12/2024 1127   GLUCOSEU NEGATIVE 09/12/2024 1127   HGBUR LARGE (A) 09/12/2024 1127   BILIRUBINUR NEGATIVE 09/12/2024 1127   BILIRUBINUR Negative 11/25/2011 1408   KETONESUR NEGATIVE 09/12/2024 1127   PROTEINUR 100 (A) 09/12/2024 1127   NITRITE POSITIVE  (A) 09/12/2024 1127   LEUKOCYTESUR MODERATE (A) 09/12/2024 1127   LEUKOCYTESUR Small 11/25/2011 1408   Radiological Exams on Admission: CT ABDOMEN PELVIS W CONTRAST Result Date: 09/12/2024 CLINICAL DATA:  Diffuse abdominal pain. EXAM: CT ABDOMEN AND PELVIS WITH CONTRAST TECHNIQUE: Multidetector CT imaging of the abdomen and pelvis was performed using the standard protocol following bolus administration of intravenous contrast. RADIATION DOSE REDUCTION: This exam was performed according to the departmental dose-optimization program which includes automated exposure control, adjustment of the mA and/or kV according to patient size and/or use of iterative reconstruction technique. CONTRAST:  75mL OMNIPAQUE  IOHEXOL  350 MG/ML SOLN COMPARISON:  12/26/2020 FINDINGS: Lower chest: Stable borderline cardiomegaly. Stable large hiatal hernia. Minimal calcified plaque over the descending thoracic aorta. Visualized lung bases demonstrate a tiny amount of left pleural fluid. Hepatobiliary: Previous cholecystectomy. Liver and biliary tree are unremarkable. Pancreas: Normal. Spleen: Normal. Adrenals/Urinary Tract: Adrenal glands are normal. Kidneys are normal size. Possible punctate nonobstructing stone over the upper pole left kidney. Possible punctate stone over the lower pole right kidney. There is mild dilatation of the right intrarenal collecting system and renal pelvis as there is a 1 cm stone over the right renal pelvis causing this low-grade obstruction. There is an adjacent punctate stone also over the right renal pelvis as well as 1-2 mm stone over the proximal right ureter. 4-5 mm over the right mid ureter. Additional 2 mm stone over the mid to distal right ureter. Left ureter and bladder are normal. Stomach/Bowel: Large hiatal hernia unchanged as described above. Possible ulcer of the gastric antrum with ill definition of the adjacent gastric wall. Small bowel is unremarkable. Appendix is normal. Mild  diverticulosis of the descending and sigmoid colon without active inflammation. Vascular/Lymphatic: Calcified plaque over the abdominal aorta which is otherwise normal in caliber. Remaining vascular structures are unremarkable. No adenopathy. Reproductive: Uterus and bilateral adnexa are unremarkable. Other: No free fluid or focal inflammatory change. Single surgical clip over the anterior left mid abdomen. Musculoskeletal: No acute abnormality. IMPRESSION: 1. Mild dilatation of the right intrarenal collecting system and renal pelvis as there is a 1 cm stone over the right renal pelvis. Additional 1-2 mm stone over the proximal right ureter and 4-5 mm stone over the right mid ureter as the stones likely account for patient's low-grade obstruction. 2 mm stone projects in the region of the mid to distal right ureter which may be an additional ureteral stone versus phlebolith. Possible punctate nonobstructing stone over the upper pole left kidney and lower pole right kidney. 2. Possible ulcer of the gastric antrum with ill definition of the adjacent gastric wall. Recommend correlation with endoscopy. 3. Stable large hiatal hernia. 4. Mild colonic diverticulosis without active inflammation. 5. Aortic atherosclerosis. Aortic Atherosclerosis (ICD10-I70.0). Electronically Signed   By: Toribio Agreste M.D.   On: 09/12/2024 11:07   Data Reviewed: Relevant notes from primary care and specialist visits, past discharge summaries as available in EHR, including Care Everywhere . Prior diagnostic testing as pertinent to current admission diagnoses, Updated medications and problem lists for reconciliation .ED course, including vitals, labs, imaging, treatment and response to treatment,Triage notes, nursing and pharmacy notes and  ED provider's notes.Notable results as noted in HPI.Discussed case with EDMD/ ED APP/ or Specialty MD on call and as needed.  Assessment & Plan   >> Right ureteral stone/UTI:  Patient has ureteral  stones secondary to hypercalcemia and are incidental findings today on CT imaging for abdominal pain.  And urology has seen patient with recommendations for aggressive IV fluid and Flomax.  Patient is a follow-up with urology on outpatient basis as she already has a scheduled appointment.  Continue Rocephin and follow culture sensitivity.  >> Hypercalcemia: Suspect primary hypercalcemia, will obtain a PTH vitamin D  levels continue with IV hydration and follow. Unclear if there is any underlying needs for evaluation  for endocrine disorder like ZES or MEN syndrome.  Other differentials most likely are PTH related hypercalcemia related to PTH adenoma.  Regardless hypercalcemia of any cause is most likely to cause ulcers.  Pancreatic gastrinoma ( Zollinger-Ellison )can also cause severe recurrent ulcers CT today shows a normal pancreas so I feel it is reassuring.  >> Epigastric abdominal pain/gastric ulcer: Pt presenting with concerning finding of ct showing gastric ulcer and epigastric pain with her recent mobic use and pt also received ketorolac in ed. D/W pt about refraining from using NSAID'S. I discussed with daughter and patient to avoid all NSAIDs. Patient follows up with [facility].   >> Essential hypertension: Vitals:   09/12/24 0828 09/12/24 1527 09/12/24 1537 09/12/24 1605  BP: (!) 159/114 (!) 155/69 (!) 143/78 (!) 160/85   09/12/24 1608  BP: (!) 160/85  PTA meds include metoprolol  which we have continued.  Current hydralazine.   DVT prophylaxis:  SCDs Consults:  GI Urology  Advance Care Planning:    Code Status: Full Code   Family Communication:  Daughter at bedside Disposition Plan:  Home Severity of Illness: The appropriate patient status for this patient is INPATIENT. Inpatient status is judged to be reasonable and necessary in order to provide the required intensity of service to ensure the patient's safety. The patient's presenting symptoms, physical exam findings,  and initial radiographic and laboratory data in the context of their chronic comorbidities is felt to place them at high risk for further clinical deterioration. Furthermore, it is not anticipated that the patient will be medically stable for discharge from the hospital within 2 midnights of admission.   * I certify that at the point of admission it is my clinical judgment that the patient will require inpatient hospital care spanning beyond 2 midnights from the point of admission due to high intensity of service, high risk for further deterioration and high frequency of surveillance required.*  Unresulted Labs (From admission, onward)     Start     Ordered   09/13/24 0500  Comprehensive metabolic panel  Tomorrow morning,   R        09/12/24 1426   09/13/24 0500  CBC  Tomorrow morning,   R        09/12/24 1426   09/12/24 1500  PTH, intact and calcium   Once,   R        09/12/24 1500            Meds ordered this encounter  Medications   ketorolac (TORADOL) 15 MG/ML injection 15 mg   sodium chloride  0.9 % bolus 500 mL   iohexol  (OMNIPAQUE ) 350 MG/ML injection 75 mL   cefTRIAXone (ROCEPHIN) 1 g in sodium chloride  0.9 % 100 mL IVPB    Antibiotic Indication::   UTI   0.9 %  sodium chloride  infusion   DISCONTD: polyethylene glycol powder (GLYCOLAX /MIRALAX ) container 17 g   pantoprazole  (PROTONIX ) injection 80 mg   pantoprazole  (PROTONIX ) injection 40 mg   polyethylene glycol (MIRALAX  / GLYCOLAX ) packet 17 g   heparin  injection 5,000 Units   OR Linked Order Group    acetaminophen  (TYLENOL ) tablet 650 mg    acetaminophen  (TYLENOL ) suppository 650 mg   bisacodyl (DULCOLAX) EC tablet 5 mg   OR Linked Order Group    ondansetron  (ZOFRAN ) tablet 4 mg    ondansetron  (ZOFRAN ) injection 4 mg   cefTRIAXone (ROCEPHIN) 1 g in sodium chloride  0.9 % 100 mL IVPB    Antibiotic Indication::   UTI   hydrALAZINE (APRESOLINE) injection 10 mg     Orders Placed This Encounter  Procedures   CT  ABDOMEN PELVIS W CONTRAST   Lipase, blood   Comprehensive metabolic panel   CBC   Urinalysis, Routine w reflex microscopic -Urine, Clean Catch   Urinalysis, Microscopic (reflex)   Magnesium   Phosphorus   Comprehensive metabolic panel   CBC   PTH, intact and calcium    VITAMIN D  25 Hydroxy (Vit-D Deficiency, Fractures)   Diet Heart Room service appropriate? Yes; Fluid consistency: Thin   Cardiac Monitoring Continuous x 48 hours Indications for use: Other; Other indications for use: Abdominal pain   Vital signs   Notify physician (specify)   Refer to Sidebar Report Refer to ICU, Med-Surg, Progressive, and Step-Down Mobility Protocol Sidebars   Initiate Adult Central Line Maintenance and Catheter Clearance Protocol for patients with central line (CVC, PICC, Port, Hemodialysis, Trialysis)   Daily weights   Intake and Output   Initiate CHG Protocol for patients in ICU/SD or any patient with a central line or foley catheter   Do not place and if present remove PureWick   Initiate Oral Care Protocol   Initiate Carrier Fluid Protocol   RN may order General Admission PRN Orders utilizing General Admission PRN medications (through manage orders) for the following patient needs: allergy symptoms (Claritin), cold sores (Carmex), cough (Robitussin DM), eye irritation (Liquifilm Tears), hemorrhoids (Tucks), indigestion (Maalox), minor skin irritation (Hydrocortisone  Cream), muscle pain (Ben Gay), nose irritation (saline nasal spray) and sore throat (Chloraseptic spray).   Ambulate with assistance   Full code   Consult for [facility] Admission   PT eval and treat   Pulse oximetry check with vital signs   Oxygen therapy Mode or (Route): Nasal cannula; Liters Per Minute: 2; Keep O2 saturation between: greater than 92 %   I-stat chem 8, ED   ED EKG   EKG 12-Lead   Saline lock IV   Admit to Inpatient (patient's expected length of stay will be greater than 2 midnights or inpatient only  procedure)   Aspiration precautions   Fall precautions    Author: Mario LULLA Blanch, MD 12 pm- 8 pm. [facility]. 09/12/2024 6:13 PM Please note for any communication after hours contact [facility] Assigned provider on call on Amion.

## 2024-09-12 NOTE — ED Notes (Signed)
 Family at bedside.

## 2024-09-12 NOTE — Consult Note (Addendum)
 Consultation  Referring Provider: TR[facility]/Patel Primary Care Physician:  Austin Mutton, MD Primary Gastroenterologist:  Dr. Albertus 2022  Reason for Consultation: Epigastric pain and abnormal CT  HPI: Melanie [name] is a 89 y.o. female with history of arthritis, hypertension, hypercalcemia, previous history of nonbleeding duodenal ulcers documented on EGD in 2022, H. pylori negative.  History of diverticulosis and previously documented cecal AVM. Patient presented to the emergency room today with complaints of upper abdominal pain which per the chart has been present over the past 3 to 4 months but had apparently worsened over the past couple of days.  There was also concern about frequent recurrent UTIs.  Patient is currently a rambling historian and somewhat difficult to get her to focus on her current issues. Workup in the ER today with CT of the abdomen and pelvis shows a large hiatal hernia stable, status postcholecystectomy and a possible gastric ulcer in the antrum with ill-definition of the adjacent gastric wall mild colonic diverticulosis noted.  She also has a dilated right intrarenal collecting system and right renal pelvis where there is a 1 cm stone over the right renal pelvis.  Additional proximal ureteral stone and a 2 mm stone projecting into the region of the mid distal ureter.  Labs show WBC 7.2/hemoglobin 14.5/hematocrit 44.2/BUN 25/creatinine 0.94 LFTs within normal limits UA abnormal/culture pending Lactate 1.0  Patient has been evaluated by urology this afternoon.  She did not have any pain on her right side or CVA tenderness, hydronephrosis felt to be mild and the size of the stones are small and felt likely to pass on their own.  The 1 larger stone would need surgical intervention at some point.  Felt stable for discharge from urologic standpoint and to follow-up in their office with Dr. Selma.  Last endoscopic evaluation May 2022/Dr. Pyrtle finding of a 10 cm hiatal hernia  and severe inflammation with congestion erosions and erythema shallow ulcerations of the gastric antrum, biopsies done no evidence of malignancy and H. pylori negative.  She also had multiple linear and superficial duodenal ulcers with no stigmata of bleeding, largest was 7 mm  Flexible sigmoidoscopy at that same setting showed left-sided diverticulosis, and external and internal hemorrhoids.  Patient is very focused on the fact that she never got to talk with Dr. Albertus after those procedures in 2022 and she had her report some complications resulting from the anesthesia within 24 hours of that procedure.  She is adamant that she will not have any more procedures, ever.  She also relates that she does not tolerate ulcer medications, had side effects from them and is not taking anymore ulcer medicine. She is aware that an ulcer was found on the CT scan today. She had been taking meloxicam earlier this year after an injury to her hand.  It sounds as if she took it for 30 to 45 days.  She still has some at home has not been taking recently.  She did not answer specifically whether or not she been taking any other anti-inflammatories.  Very difficult to get her to focus on her current issues, continues to talk about being upset about her previous experiences.  She does report that her epigastric pain seems to improve after feeding it and then seems to return about 3 hours later.  She has not had any nausea or vomiting, no melena or hematochezia     Past Medical History:  Diagnosis Date   Allergy    Anemia  Arthritis    right hip is most painful and affects mobility as far as ambulating steps   AVM (arteriovenous malformation)    of cecum   Cataract    Complication of anesthesia    pt states she had memory problems for several days after   Diverticula of colon    Duodenal ulcer 2015   x 4 nonbleeding   Duodenitis    Hearing loss    Hiatal hernia    Hypercalcemia 08/18/2013   Hypertension     Iron  deficiency anemia, unspecified 12/25/2012    Past Surgical History:  Procedure Laterality Date   CATARACT EXTRACTION Bilateral    2008   CHOLECYSTECTOMY     laparoscopic    COLONOSCOPY N/A 12/26/2013   Procedure: COLONOSCOPY;  Surgeon: Gordy CHRISTELLA Starch, MD;  Location: [facility];  Service: Gastroenterology;  Laterality: N/A;   ESOPHAGOGASTRODUODENOSCOPY N/A 12/26/2013   Procedure: ESOPHAGOGASTRODUODENOSCOPY (EGD);  Surgeon: Gordy CHRISTELLA Starch, MD;  Location: [facility];  Service: Gastroenterology;  Laterality: N/A;   GIVENS CAPSULE STUDY N/A 12/26/2013   Procedure: GIVENS CAPSULE STUDY;  Surgeon: Gordy CHRISTELLA Starch, MD;  Location: [facility];  Service: Gastroenterology;  Laterality: N/A;  Due to inablility to swallow capsule, will place Endoscopically into Small Bowel.   HOT HEMOSTASIS N/A 12/26/2013   Procedure: HOT HEMOSTASIS (ARGON PLASMA COAGULATION/BICAP);  Surgeon: Gordy CHRISTELLA Starch, MD;  Location: [facility];  Service: Gastroenterology;  Laterality: N/A;   TONSILLECTOMY      Prior to Admission medications   Medication Sig Start Date End Date Taking? Authorizing Provider  aspirin 81 MG tablet Take 81 mg by mouth daily.     [provider]  Carboxymethylcellulose Sodium (EYE DROPS OP) Apply 1 drop to eye 3 (three) times daily. For dry eyes,    [provider]  cholecalciferol (VITAMIN D ) 1000 units tablet Take 5,000 Units by mouth daily.    [provider]  Cyanocobalamin (VITAMIN B12) 500 MCG TABS Take 500 mcg by mouth daily. 07/02/23   Lonn Hicks, MD  folic acid  (FOLVITE ) 1 MG tablet TAKE 1 TABLET BY MOUTH EVERY DAY 09/23/20   Lonn Hicks, MD  meloxicam (MOBIC) 15 MG tablet Take 15 mg by mouth daily.    [provider]  metoprolol  (LOPRESSOR ) 50 MG tablet Take 1 tablet (50 mg total) by mouth 2 (two) times daily. 11/02/16   Jordan, [name], MD  polyethylene glycol powder (GLYCOLAX /MIRALAX ) powder Take 17 g by mouth daily as needed. 07/15/18   Lonn Hicks, MD   tacrolimus (PROTOPIC) 0.1 % ointment Apply 1 Application topically daily. 05/10/24   [provider]    Current Facility-Administered Medications  Medication Dose Route Frequency Provider Last Rate Last Admin   0.9 %  sodium chloride  infusion   Intravenous Continuous Tobie Mario GAILS, MD 100 mL/hr at 09/12/24 1508 New Bag at 09/12/24 1508   acetaminophen  (TYLENOL ) tablet 650 mg  650 mg Oral Q6H PRN Patel, Ekta V, MD       Or   acetaminophen  (TYLENOL ) suppository 650 mg  650 mg Rectal Q6H PRN Patel, Ekta V, MD       bisacodyl (DULCOLAX) EC tablet 5 mg  5 mg Oral Daily PRN Patel, Ekta V, MD       heparin  injection 5,000 Units  5,000 Units Subcutaneous Q12H Tobie Mario GAILS, MD   5,000 Units at 09/12/24 1454   ondansetron  (ZOFRAN ) tablet 4 mg  4 mg Oral Q6H PRN Tobie Mario GAILS, MD  Or   ondansetron  (ZOFRAN ) injection 4 mg  4 mg Intravenous Q6H PRN Tobie Mario GAILS, MD       [START ON 09/13/2024] pantoprazole  (PROTONIX ) injection 40 mg  40 mg Intravenous Q12H Tobie Mario GAILS, MD       polyethylene glycol (MIRALAX  / GLYCOLAX ) packet 17 g  17 g Oral QHS Laurice Maude BROCKS, MD        Allergies as of 09/12/2024 - Review Complete 09/12/2024  Allergen Reaction Noted   Cephalexin Other (See Comments) 08/22/2011   Clindamycin/lincomycin  04/19/2015   Codeine Nausea And Vomiting 10/21/2011   Levofloxacin Other (See Comments) 04/16/2011   Morphine  and codeine Nausea And Vomiting 10/21/2011   Other  08/22/2011   Quinolones Other (See Comments) 11/06/2011   Sulfa antibiotics Nausea And Vomiting 11/06/2011   Tramadol Nausea And Vomiting 01/01/2015   Amoxicillin Nausea Only 11/12/2011   Fexofenadine hcl Other (See Comments) 11/12/2011   Floxin [ocuflox] Other (See Comments) 11/12/2011   Neurontin [gabapentin] Palpitations 11/12/2011   Septra [bactrim] Other (See Comments) 11/12/2011    Family History  Problem Relation Age of Onset   Asthma Other    Hypertension Other    Hyperlipidemia Other     Stroke Other    Diabetes Mother    Heart disease Mother    Diverticulosis Mother    Heart disease Father    Breast cancer Daughter 89   Colon cancer Neg Hx    Rectal cancer Neg Hx    Stomach cancer Neg Hx    Esophageal cancer Neg Hx     Social History   Socioeconomic History   Marital status: Divorced    Spouse name: Not on file   Number of children: 3   Years of education: Not on file   Highest education level: Not on file  Occupational History   Occupation: Retired  Tobacco Use   Smoking status: Never   Smokeless tobacco: Never  Vaping Use   Vaping status: Never Used  Substance and Sexual Activity   Alcohol use: No   Drug use: No   Sexual activity: Not Currently  Other Topics Concern   Not on file  Social History Narrative   Not on file   Social Drivers of Health   Financial Resource Strain: Not on file  Food Insecurity: Not on file  Transportation Needs: Not on file  Physical Activity: Not on file  Stress: Not on file  Social Connections: Not on file  Intimate Partner Violence: Not on file    Review of Systems: Pertinent positive and negative review of systems were noted in the above HPI section.  All other review of systems was otherwise negative.   Physical Exam: Vital signs in last 24 hours: Temp:  [97.6 F (36.4 C)-97.9 F (36.6 C)] 97.9 F (36.6 C) (12/02 1608) Pulse Rate:  [79-97] 90 (12/02 1608) Resp:  [15-20] 18 (12/02 1608) BP: (143-160)/(69-114) 160/85 (12/02 1608) SpO2:  [95 %-100 %] 100 % (12/02 1608)   General:   Alert,  Well-developed, well-nourished, older early white female cooperative, talkative in NAD eating a full meal Head:  Normocephalic and atraumatic. Eyes:  Sclera clear, no icterus.   Conjunctiva pink. Ears:  Normal auditory acuity. Nose:  No deformity, discharge,  or lesions. Mouth:  No deformity or lesions.   Neck:  Supple; no masses or thyromegaly. Lungs:  Clear throughout to auscultation.   No wheezes, crackles, or  rhonchi.  Heart:  Regular rate and rhythm; no murmurs, clicks,  rubs,  or gallops. Abdomen:  Soft, mild epigastric tenderness, no guarding or rebound BS active,nonpalp mass or hsm.   Rectal: Not done Msk:  Symmetrical without gross deformities. . Pulses:  Normal pulses noted. Extremities:  Without clubbing or edema. Neurologic:  Alert and  oriented x4;  grossly normal neurologically. Skin:  Intact without significant lesions or rashes.. Psych:  Alert and cooperative. Normal mood and affect.  Intake/Output from previous day: No intake/output data recorded. Intake/Output this shift: No intake/output data recorded.  Lab Results: Recent Labs    09/12/24 0900 09/12/24 0937  WBC 7.2  --   HGB 14.5 13.9  HCT 44.2 41.0  PLT 287  --    BMET Recent Labs    09/12/24 0900 09/12/24 0937  NA 139 141  K 4.0 4.0  CL 105 106  CO2 26  --   GLUCOSE 131* 127*  BUN 25* 25*  CREATININE 0.91 0.90  CALCIUM  11.0*  --    LFT Recent Labs    09/12/24 0900  PROT 6.7  ALBUMIN 3.3*  AST 16  ALT 16  ALKPHOS 95  BILITOT 0.7   PT/INR No results for input(s): LABPROT, INR in the last 72 hours. Hepatitis Panel No results for input(s): HEPBSAG, HCVAB, HEPAIGM, HEPBIGM in the last 72 hours.   IMPRESSION:  #71 89 year old white female who we are asked to evaluate for epigastric pain.  Reports of epigastric pain over the past 3 to 4 months by chart worsened over the past couple of days though it is difficult to get patient to say when she thought her pain worsened. Has not been having any difficulty eating, no nausea or vomiting pain sometimes improves postprandially.,  No melena or hematochezia.  CT scan today as outlined above from GI perspective shows a possible antral gastric ulcer with some ill-definition of the adjacent wall, in addition to urologic findings as outlined above.  Patient had been taking a recent prescription NSAID/meloxicam which she is no longer on.  Suspect  she may have an NSAID induced gastric ulcer at this time  #2 previously documented severe gastritis and duodenal ulcers that EGD May 2022, H. pylori negative  #3 mild right hydronephrosis and right ureteral stones #4 probable UTI  Plan; patient currently refusing any ulcer medication as she says she does not tolerate. Discontinue all NSAIDs Patient declining any endoscopic evaluation or any other procedures.  I am not certain whether she has tried famotidine  which would could be a possible option if she cannot tolerate a PPI  Allow diet as tolerated  GI will sign off, available if needed   Amy Esterwood PA-C 09/12/2024, 5:26 PM   Attending physician's note  I personally saw the patient and performed a substantive portion of the medical decision making process for this encounter (including a complete performance of the key components : MDM, Hx and Exam), in conjunction with the APP.  I agree with the APP's note, impression, and  the management plan for the number and complexity of problems addressed at the encounter for the patient and take responsibility for that plan with its inherent risk of complications, morbidity, or mortality with additional input as follows.     29 yr F with h/o large hiatal hernia, gastro duodenal ulcer with worsening of chronic epigastric pain.   CT abd & pelvis with findings of possible gastric antral ulcer  On exam abd is soft NTND  She was taking NSAID's frequently in the past few months  She is  refusing starting PPI, claims she had severe arm spasms with it.  Refusing EGD or any invasive procedures Avoid NSAID's Consider starting Pepcid  40mg  BID X 3 months and add Carafate  1gm before meals and at bedtime X 1 week if patient is willing  Please call GI back if needed  The patient was provided an opportunity to ask questions and all were answered. The patient agreed with the plan and demonstrated an understanding of the instructions.  LOIS Wilkie Mcgee , MD [phone]

## 2024-09-12 NOTE — Consult Note (Signed)
 Urology Consult Note   Requesting Attending Physician:  Laurice Maude BROCKS, MD Service Providing Consult: Urology  Consulting Attending: Dr. Cam   Reason for Consult:    HPI: Melanie [name] is seen in consultation for reasons noted above at the request of Laurice Maude BROCKS, MD. Patient is a 89 y.o. female presenting for intermittent epigastric abdominal pain for 2 to 3 months.  She reports a gnawing burning feeling that is relieved with eating, typically returning within a few hours.  Patient is known to our practice and followed by Dr. Selma for chronic UTI and urinary incontinence.  CT A/P incidentally found multiple right ureteral stones measuring 10 mm at the right renal pelvis, 1 to 2 mm proximally, 4 to 5 mm mid ureter, and 2 mm in the distal ureter respectively.  Patient has struggled with urinary tract infections for quite some time, largely because she refuses to take any provided medications.  She explained in detail how she reads the package insert and if there is anything that she does not like or if she is aware of a person who has had a side effect from the medication, she refuses and adds to her length the allergy list.  She has not taken recently prescribed antibiotics or used Estrace cream.  [facility] was consulted to speak to these CT findings and consider any needed intervention.  ------------------  Assessment:   89 y.o. female with    Recommendations: # Multiple right ureteral stones  Her epigastric pain localizes to the area of a suspected ulcer.  Her ureteral stones appear to be an incidental finding.  She has no pain on her right side or CVA tenderness.  She has no signs of systemic infectious process and is afebrile.  Hydronephrosis is mild.  The size of the stones are likely to pass organically, though I expect her 10 mm UPJ stone will need surgical intervention at some point.  I explained at length that she would need to comply with antibiotic regimen  before being a surgical candidate.  She is awaiting cystoscopy with Dr. Selma at the near future.  I spoke with her and her daughter for a long time about the need to trust the medications and that it would be virtually impossible for us  to help her long-term without some participation on her part.  She expressed understanding but is unclear whether or not she will ultimately complete recommended treatment.  Urinalysis pending.  I expect this will again be contaminated with E. coli.  Since she is asymptomatic and unwilling to receive antibiotic treatment, risk versus benefit would be to continue with medical expulsive therapy.  Would recommend Flomax if she will take it and pushing fluids.  Okay to discharge once medically clear.  Please call with questions.  Case and plan discussed with Dr. Cam  Past Medical History: Past Medical History:  Diagnosis Date   Allergy    Anemia    Arthritis    right hip is most painful and affects mobility as far as ambulating steps   AVM (arteriovenous malformation)    of cecum   Cataract    Complication of anesthesia    pt states she had memory problems for several days after   Diverticula of colon    Duodenal ulcer 2015   x 4 nonbleeding   Duodenitis    Hearing loss    Hiatal hernia    Hypercalcemia 08/18/2013   Hypertension    Iron  deficiency anemia, unspecified 12/25/2012  Past Surgical History:  Past Surgical History:  Procedure Laterality Date   CATARACT EXTRACTION Bilateral    2008   CHOLECYSTECTOMY     laparoscopic    COLONOSCOPY N/A 12/26/2013   Procedure: COLONOSCOPY;  Surgeon: Gordy CHRISTELLA Starch, MD;  Location: [facility];  Service: Gastroenterology;  Laterality: N/A;   ESOPHAGOGASTRODUODENOSCOPY N/A 12/26/2013   Procedure: ESOPHAGOGASTRODUODENOSCOPY (EGD);  Surgeon: Gordy CHRISTELLA Starch, MD;  Location: [facility];  Service: Gastroenterology;  Laterality: N/A;   GIVENS CAPSULE STUDY N/A 12/26/2013   Procedure: GIVENS CAPSULE STUDY;   Surgeon: Gordy CHRISTELLA Starch, MD;  Location: [facility];  Service: Gastroenterology;  Laterality: N/A;  Due to inablility to swallow capsule, will place Endoscopically into Small Bowel.   HOT HEMOSTASIS N/A 12/26/2013   Procedure: HOT HEMOSTASIS (ARGON PLASMA COAGULATION/BICAP);  Surgeon: Gordy CHRISTELLA Starch, MD;  Location: [facility];  Service: Gastroenterology;  Laterality: N/A;   TONSILLECTOMY      Medication: Current Facility-Administered Medications  Medication Dose Route Frequency Provider Last Rate Last Admin   cefTRIAXone (ROCEPHIN) 1 g in sodium chloride  0.9 % 100 mL IVPB  1 g Intravenous Once Messick, Peter C, MD       Current Outpatient Medications  Medication Sig Dispense Refill   aspirin 81 MG tablet Take 81 mg by mouth daily.      Carboxymethylcellulose Sodium (EYE DROPS OP) Apply 1 drop to eye 3 (three) times daily. For dry eyes,     cholecalciferol (VITAMIN D ) 1000 units tablet Take 5,000 Units by mouth daily.     Cyanocobalamin (VITAMIN B12) 500 MCG TABS Take 500 mcg by mouth daily.     folic acid  (FOLVITE ) 1 MG tablet TAKE 1 TABLET BY MOUTH EVERY DAY 90 tablet 3   meloxicam (MOBIC) 15 MG tablet Take 15 mg by mouth daily.     metoprolol  (LOPRESSOR ) 50 MG tablet Take 1 tablet (50 mg total) by mouth 2 (two) times daily. 180 tablet 1   polyethylene glycol powder (GLYCOLAX /MIRALAX ) powder Take 17 g by mouth daily as needed. 1051 g 1   tacrolimus (PROTOPIC) 0.1 % ointment Apply 1 Application topically daily.      Allergies: Allergies  Allergen Reactions   Cephalexin Other (See Comments)    Affected pt's child -  Therefore , pt does not take Keflex.   Clindamycin/Lincomycin    Codeine Nausea And Vomiting   Levofloxacin Other (See Comments)    Hallucinations.   Morphine  And Codeine Nausea And Vomiting   Other     Another antibiotic that starts with cina?   Quinolones Other (See Comments)    Hallucinations.   Sulfa Antibiotics Nausea And Vomiting   Tramadol Nausea And Vomiting    Amoxicillin Nausea Only   Fexofenadine Hcl Other (See Comments)    unknown reaction   Floxin [Ocuflox] Other (See Comments)    unknown allergy   Neurontin [Gabapentin] Palpitations   Septra [Bactrim] Other (See Comments)    Unknown reaction    Social History: Social History   Tobacco Use   Smoking status: Never   Smokeless tobacco: Never  Vaping Use   Vaping status: Never Used  Substance Use Topics   Alcohol use: No   Drug use: No    Family History Family History  Problem Relation Age of Onset   Asthma Other    Hypertension Other    Hyperlipidemia Other    Stroke Other    Diabetes Mother    Heart disease Mother    Diverticulosis Mother  Heart disease Father    Breast cancer Daughter 57   Colon cancer Neg Hx    Rectal cancer Neg Hx    Stomach cancer Neg Hx    Esophageal cancer Neg Hx     Review of Systems  Gastrointestinal:        Gnawing burning epigastric pain  Genitourinary:  Positive for dysuria.     Objective   Vital signs in last 24 hours: BP (!) 159/114 (BP Location: Left Arm)   Pulse 97   Temp 97.9 F (36.6 C)   Resp 16   SpO2 98%   Physical Exam General: A&O, resting, appropriate HEENT: [place]/AT, very hard of hearing Pulmonary: Normal work of breathing Cardiovascular: no cyanosis Abdomen: Soft, NTTP, protuberant  Most Recent Labs: Lab Results  Component Value Date   WBC 7.2 09/12/2024   HGB 13.9 09/12/2024   HCT 41.0 09/12/2024   PLT 287 09/12/2024    Lab Results  Component Value Date   NA 141 09/12/2024   K 4.0 09/12/2024   CL 106 09/12/2024   CO2 26 09/12/2024   BUN 25 (H) 09/12/2024   CREATININE 0.90 09/12/2024   CALCIUM  11.0 (H) 09/12/2024    Lab Results  Component Value Date   INR 0.98 11/12/2011   APTT 32 11/12/2011     Urine Culture: @[id number] (laburin,org,[id number])@   IMAGING: CT ABDOMEN PELVIS W CONTRAST Result Date: 09/12/2024 CLINICAL DATA:  Diffuse abdominal pain. EXAM: CT ABDOMEN AND PELVIS WITH  CONTRAST TECHNIQUE: Multidetector CT imaging of the abdomen and pelvis was performed using the standard protocol following bolus administration of intravenous contrast. RADIATION DOSE REDUCTION: This exam was performed according to the departmental dose-optimization program which includes automated exposure control, adjustment of the mA and/or kV according to patient size and/or use of iterative reconstruction technique. CONTRAST:  75mL OMNIPAQUE  IOHEXOL  350 MG/ML SOLN COMPARISON:  12/26/2020 FINDINGS: Lower chest: Stable borderline cardiomegaly. Stable large hiatal hernia. Minimal calcified plaque over the descending thoracic aorta. Visualized lung bases demonstrate a tiny amount of left pleural fluid. Hepatobiliary: Previous cholecystectomy. Liver and biliary tree are unremarkable. Pancreas: Normal. Spleen: Normal. Adrenals/Urinary Tract: Adrenal glands are normal. Kidneys are normal size. Possible punctate nonobstructing stone over the upper pole left kidney. Possible punctate stone over the lower pole right kidney. There is mild dilatation of the right intrarenal collecting system and renal pelvis as there is a 1 cm stone over the right renal pelvis causing this low-grade obstruction. There is an adjacent punctate stone also over the right renal pelvis as well as 1-2 mm stone over the proximal right ureter. 4-5 mm over the right mid ureter. Additional 2 mm stone over the mid to distal right ureter. Left ureter and bladder are normal. Stomach/Bowel: Large hiatal hernia unchanged as described above. Possible ulcer of the gastric antrum with ill definition of the adjacent gastric wall. Small bowel is unremarkable. Appendix is normal. Mild diverticulosis of the descending and sigmoid colon without active inflammation. Vascular/Lymphatic: Calcified plaque over the abdominal aorta which is otherwise normal in caliber. Remaining vascular structures are unremarkable. No adenopathy. Reproductive: Uterus and bilateral  adnexa are unremarkable. Other: No free fluid or focal inflammatory change. Single surgical clip over the anterior left mid abdomen. Musculoskeletal: No acute abnormality. IMPRESSION: 1. Mild dilatation of the right intrarenal collecting system and renal pelvis as there is a 1 cm stone over the right renal pelvis. Additional 1-2 mm stone over the proximal right ureter and 4-5 mm stone over the right mid ureter  as the stones likely account for patient's low-grade obstruction. 2 mm stone projects in the region of the mid to distal right ureter which may be an additional ureteral stone versus phlebolith. Possible punctate nonobstructing stone over the upper pole left kidney and lower pole right kidney. 2. Possible ulcer of the gastric antrum with ill definition of the adjacent gastric wall. Recommend correlation with endoscopy. 3. Stable large hiatal hernia. 4. Mild colonic diverticulosis without active inflammation. 5. Aortic atherosclerosis. Aortic Atherosclerosis (ICD10-I70.0). Electronically Signed   By: Toribio Agreste M.D.   On: 09/12/2024 11:07    ------  Ole Bourdon, NP Pager: [phone]   Please contact the urology consult pager with any further questions/concerns.

## 2024-09-12 NOTE — Plan of Care (Signed)
 Had a seemingly uneventful day. This patient is aware of necessity for continued observation and is aware of care plan and educational needs at this time.  - HA -N/V -Lightheadedness/Dizziness +GI s/s: Intermittent Pain to Epigastric Region, Chronic Diarrhea  -GU s/s +N/T: Chronic Neuropathy  -Falls/LOC -Cough/Congestion   Patient remains alert and oriented to: person, place, situation, time   Provider listed on chart contacted on 1 occurrences during shift.  Patient requesting home medications, provider and oncoming RN aware.   Rachele Lamaster, RN

## 2024-09-12 NOTE — Hospital Course (Addendum)
 SABRA

## 2024-09-12 NOTE — ED Notes (Signed)
Walked patient to the bathroom patient did well patient is now back in bed on the monitor with call bell in reach and family at bedside 

## 2024-09-12 NOTE — ED Triage Notes (Addendum)
 Pt. Stated, (hard to hear), Ive had stomach pain all over and Im not able to pass gas or to burp since September. Daughter stated, she has had E-coli that they have been watching.

## 2024-09-13 LAB — CBC
HCT: 37.3 % (ref 36.0–46.0)
Hemoglobin: 12.2 g/dL (ref 12.0–15.0)
MCH: 29 pg (ref 26.0–34.0)
MCHC: 32.7 g/dL (ref 30.0–36.0)
MCV: 88.8 fL (ref 80.0–100.0)
Platelets: 231 K/uL (ref 150–400)
RBC: 4.2 MIL/uL (ref 3.87–5.11)
RDW: 13.2 % (ref 11.5–15.5)
WBC: 4.4 K/uL (ref 4.0–10.5)
nRBC: 0 % (ref 0.0–0.2)

## 2024-09-13 LAB — COMPREHENSIVE METABOLIC PANEL WITH GFR
ALT: 15 U/L (ref 0–44)
AST: 17 U/L (ref 15–41)
Albumin: 2.5 g/dL — ABNORMAL LOW (ref 3.5–5.0)
Alkaline Phosphatase: 70 U/L (ref 38–126)
Anion gap: 6 (ref 5–15)
BUN: 16 mg/dL (ref 8–23)
CO2: 22 mmol/L (ref 22–32)
Calcium: 9.9 mg/dL (ref 8.9–10.3)
Chloride: 113 mmol/L — ABNORMAL HIGH (ref 98–111)
Creatinine, Ser: 0.77 mg/dL (ref 0.44–1.00)
GFR, Estimated: >60 mL/min (ref >60)
Glucose, Bld: 95 mg/dL (ref 70–99)
Potassium: 4.3 mmol/L (ref 3.5–5.1)
Sodium: 141 mmol/L (ref 135–145)
Total Bilirubin: 0.5 mg/dL (ref 0.0–1.2)
Total Protein: 5.2 g/dL — ABNORMAL LOW (ref 6.5–8.1)

## 2024-09-13 LAB — PTH, INTACT AND CALCIUM
Calcium, Total (PTH): 10.5 mg/dL — ABNORMAL HIGH (ref 8.7–10.3)
PTH: 99 pg/mL — ABNORMAL HIGH (ref 15–65)

## 2024-09-13 MED ORDER — ORAL CARE MOUTH RINSE: 15.0000 mL | OROMUCOSAL | Status: DC | PRN

## 2024-09-13 NOTE — Evaluation (Signed)
 Physical Therapy Evaluation Patient Details Name: Melanie [name] MRN: [id number] DOB: September 02, 1935 Today's Date: 09/13/2024  History of Present Illness  Pt is an 89 y.o. female presenting 12/2 with abdominal pain. CT findings significant for possible ulcer of the gastric antrum with ill definition of the adjacent gastric wall and incidental nephrolithiasis. Found to have UTI. Med hx significant for iron  deficiency anemia.  Clinical Impression  PTA, pt living with daughter in two story home with 1 STE. States she is independent with ADLs, daughter drives her as needed, denies falls, ambulates with no AD in the home and with [facility] in the community. Typically uses lift to mobilize up the stairs. Currently, pt is CGA for transfers and ambulation, with pt ambulating 150' with SPC and CGA with no staggering or overt LOB. Discussed senior services with pt for increased opportunity to exercise and maintain activity levels, and she expressed understanding. Appears that pt is close to functional baseline, has a good caregiver through her daughter, and they are well equipped at home. No anticipated therapy needs upon d/c from [facility]. Acute PT to follow to maintain functional mobility and strength.       If plan is discharge home, recommend the following: A little help with walking and/or transfers;Help with stairs or ramp for entrance;Assist for transportation   Can travel by private vehicle        Equipment Recommendations None recommended by PT  Recommendations for Other Services       Functional Status Assessment Patient has not had a recent decline in their functional status     Precautions / Restrictions Precautions Precautions: Fall Recall of Precautions/Restrictions: Intact Restrictions Weight Bearing Restrictions Per Provider Order: No      Mobility  Bed Mobility               General bed mobility comments: Pt received sitting EOB and returned to sitting up in chair     Transfers Overall transfer level: Needs assistance Equipment used: None Transfers: Sit to/from Stand Sit to Stand: Contact guard assist           General transfer comment: Pt initially refusing gait belt and therapy guarding her, but allowed daughter to guard her and ambulate her to the bathroom. After pt exited bathroom, she allowed pt to don gait belt and guard pt during ambulation.    Ambulation/Gait Ambulation/Gait assistance: Contact guard assist Gait Distance (Feet): 150 Feet Assistive device: Straight cane Gait Pattern/deviations: Step-through pattern, Decreased stride length, Trunk flexed Gait velocity: Dec Gait velocity interpretation: 1.31 - 2.62 ft/sec, indicative of limited community ambulator   General Gait Details: Pt with inc lateral sway during stance phase bilaterally. No overt LOB or staggering noted. Kyphotic posture noted.  Stairs            Wheelchair Mobility     Tilt Bed    Modified Rankin (Stroke Patients Only)       Balance Overall balance assessment: Needs assistance Sitting-balance support: No upper extremity supported, Feet supported Sitting balance-Leahy Scale: Good     Standing balance support: Single extremity supported, During functional activity Standing balance-Leahy Scale: Fair Standing balance comment: Able to maintain standing for very short period with no external support, but frequently reaches for wall or AD for support                             Pertinent Vitals/Pain Pain Assessment Pain Assessment: Faces Faces Pain Scale: No  hurt Pain Intervention(s): Monitored during session    Home Living Family/patient expects to be discharged to:: Private residence Living Arrangements: Children Available Help at Discharge: Family;Available PRN/intermittently Type of Home: House Home Access: Stairs to enter Entrance Stairs-Rails: None Entrance Stairs-Number of Steps: 1 Alternate Level Stairs-Number of Steps:  Flight Home Layout: Two level Home Equipment: Cane - single point;Grab bars - toilet;BSC/3in1;Lift chair (*lift chair at stairs)      Prior Function Prior Level of Function : Independent/Modified Independent             Mobility Comments: Typically walks with no AD in home, with cane in community, denies falling ever ADLs Comments: Independent     Extremity/Trunk Assessment   Upper Extremity Assessment Upper Extremity Assessment: Generalized weakness    Lower Extremity Assessment Lower Extremity Assessment: Generalized weakness    Cervical / Trunk Assessment Cervical / Trunk Assessment: Kyphotic  Communication   Communication Communication: No apparent difficulties    Cognition Arousal: Alert Behavior During Therapy: WFL for tasks assessed/performed   PT - Cognitive impairments: No apparent impairments                         Following commands: Intact       Cueing Cueing Techniques: Verbal cues, Gestural cues     General Comments General comments (skin integrity, edema, etc.): Appears pt has good caregiver support and is well equipped at home.    Exercises     Assessment/Plan    PT Assessment Patient needs continued PT services  PT Problem List Decreased strength;Decreased balance;Decreased activity tolerance;Decreased mobility;Decreased coordination;Decreased knowledge of use of DME;Decreased safety awareness       PT Treatment Interventions DME instruction;Stair training;Gait training;Functional mobility training;Therapeutic activities;Therapeutic exercise;Balance training;Neuromuscular re-education;Patient/family education;Manual techniques;Modalities    PT Goals (Current goals can be found in the Care Plan section)  Acute Rehab PT Goals Patient Stated Goal: to go home PT Goal Formulation: With patient/family Time For Goal Achievement: 09/27/24 Potential to Achieve Goals: Good    Frequency Min 2X/week     Co-evaluation                AM-PAC PT 6 Clicks Mobility  Outcome Measure Help needed turning from your back to your side while in a flat bed without using bedrails?: None Help needed moving from lying on your back to sitting on the side of a flat bed without using bedrails?: None Help needed moving to and from a bed to a chair (including a wheelchair)?: A Little Help needed standing up from a chair using your arms (e.g., wheelchair or bedside chair)?: A Little Help needed to walk in hospital room?: A Little Help needed climbing 3-5 steps with a railing? : A Little 6 Click Score: 20    End of Session Equipment Utilized During Treatment: Gait belt Activity Tolerance: Patient tolerated treatment well Patient left: in chair;with call bell/phone within reach;with chair alarm set;with family/visitor present Nurse Communication: Mobility status PT Visit Diagnosis: Other abnormalities of gait and mobility (R26.89);Difficulty in walking, not elsewhere classified (R26.2)    Time: 9185-9158 PT Time Calculation (min) (ACUTE ONLY): 27 min   Charges:   PT Evaluation $PT Eval Low Complexity: 1 Low PT Treatments $Therapeutic Activity: 8-22 mins PT General Charges $$ ACUTE PT VISIT: 1 Visit         Koltyn Kelsay, SPT   Mahek Schlesinger 09/13/2024, 10:13 AM

## 2024-09-13 NOTE — Progress Notes (Addendum)
 Consultation Progress Note   Patient: Melanie [name] FMW:[id number] DOB: Feb 19, 1935 DOA: 09/12/2024 DOS: the patient was seen and examined on 09/13/2024 Primary service: DibiaLandon BRAVO, MD  Brief hospital course: 89 y.o. female with past medical history  of  89 year old female with past medical history of iron  deficiency anemia that has been stable over the past 2 years without IV iron  infusion being followed by hematology Dr. Lonn, has a history of endoscopy and colonoscopy with Dr. Albertus which showed severe inflammation multiple linear duodenal ulcers gastric ulcers, being admitted today for epigastric pain and found to have incidental nephrolithiasis and UTI.  Pt has h/o refusing recommendation and having reservations with treatment plan in past. Has currently refused GI evaluation for her gastric ulcer.    Assessment and Plan: Right ureteral stone/UTI:  --Patient has ureteral stones secondary to hypercalcemia and are incidental findings today on CT imaging for abdominal pain.  And urology has seen patient with recommendations for aggressive IV fluid and Flomax.   --Patient is a follow-up with urology on outpatient basis as she already has a scheduled appointment.  Continue Rocephin and follow culture sensitivity.   Hypercalcemia: Suspect primary hypercalcemia, will obtain a PTH vitamin D  levels continue with IV hydration and follow.   Epigastric abdominal pain/gastric ulcer: Pt presenting with concerning finding of ct showing gastric ulcer and epigastric pain with her recent mobic use and pt also received ketorolac in ed. D/W pt about refraining from using NSAID'S. GI evaluated patient and patient was refusing any ulcer medication as she says she does not tolerate.  Patient also declined any endoscopic evaluation or any other procedures.   HTN metoprolol   continued. Current hydralazine      TRH will continue to follow the patient.  Subjective: States that she feels better  this morning, denies nausea, vomiting, abdominal pain.  Physical Exam: Vitals:   09/12/24 2034 09/13/24 0500 09/13/24 0546 09/13/24 0801  BP: (!) 103/57  (!) 116/53 138/78  Pulse: 78  71 88  Resp:  18  19  Temp: 97.7 F (36.5 C)  98.7 F (37.1 C) 98.3 F (36.8 C)  TempSrc: Oral  Oral   SpO2: 95%  96% 97%  Weight:  87.6 kg    Height:       General  No acute Distress Eyes: PERRL, lids and conjunctivae normal ENMT: Mucous membranes are moist.   Neck: normal, supple, no masses, no thyromegaly Respiratory: clear to auscultation bilaterally, no wheezing, no crackles. Normal respiratory effort. No accessory muscle use.  Cardiovascular: Regular rate and rhythm, no murmurs / rubs / gallops Abdomen: Soft, nontender nondistended. Musculoskeletal: no clubbing / cyanosis. No joint deformity upper and lower extremities.  Skin: no rashes, lesions, ulcers. No induration Neurologic: Facial asymmetry, moving extremity spontaneously, speech fluent. Psychiatric: Normal judgment and insight. Alert and oriented x 3. Normal mood.   Data Reviewed:  Results are pending, will review when available. CBC    Component Value Date/Time   WBC 4.4 09/13/2024 0554   RBC 4.20 09/13/2024 0554   HGB 12.2 09/13/2024 0554   HGB 14.2 07/04/2024 1051   HGB 10.3 (L) 09/08/2017 1114   HCT 37.3 09/13/2024 0554   HCT 33.0 (L) 09/08/2017 1114   PLT 231 09/13/2024 0554   PLT 267 07/04/2024 1051   PLT 393 09/08/2017 1114   MCV 88.8 09/13/2024 0554   MCV 79.2 (L) 09/08/2017 1114   MCH 29.0 09/13/2024 0554   MCHC 32.7 09/13/2024 0554   RDW 13.2 09/13/2024  0554   RDW 18.3 (H) 09/08/2017 1114   LYMPHSABS 1.1 07/04/2024 1051   LYMPHSABS 1.5 09/08/2017 1114   MONOABS 0.4 07/04/2024 1051   MONOABS 0.4 09/08/2017 1114   EOSABS 0.3 07/04/2024 1051   EOSABS 0.2 09/08/2017 1114   BASOSABS 0.0 07/04/2024 1051   BASOSABS 0.0 09/08/2017 1114   CMP     Component Value Date/Time   NA 141 09/13/2024 0554   NA 144  02/12/2014 0849   K 4.3 09/13/2024 0554   K 3.6 02/12/2014 0849   CL 113 (H) 09/13/2024 0554   CL 109 (H) 12/23/2012 1100   CO2 22 09/13/2024 0554   CO2 24 02/12/2014 0849   GLUCOSE 95 09/13/2024 0554   GLUCOSE 152 (H) 02/12/2014 0849   GLUCOSE 106 (H) 12/23/2012 1100   BUN 16 09/13/2024 0554   BUN 15.4 02/12/2014 0849   CREATININE 0.77 09/13/2024 0554   CREATININE 0.8 02/12/2014 0849   CALCIUM  9.9 09/13/2024 0554   CALCIUM  10.7 (H) 02/12/2014 0849   PROT 5.2 (L) 09/13/2024 0554   PROT 6.3 (L) 02/12/2014 0849   ALBUMIN 2.5 (L) 09/13/2024 0554   ALBUMIN 3.4 (L) 02/12/2014 0849   AST 17 09/13/2024 0554   AST 13 02/12/2014 0849   ALT 15 09/13/2024 0554   ALT 8 02/12/2014 0849   ALKPHOS 70 09/13/2024 0554   ALKPHOS 78 02/12/2014 0849   BILITOT 0.5 09/13/2024 0554   BILITOT 0.25 02/12/2014 0849   GFR 79.11 11/02/2016 1403   GFRNONAA >60 09/13/2024 0554      Time spent: 35 minutes.  Author: Landon FORBES Baller, MD 09/13/2024 9:05 AM  For on call review [email or web address].

## 2024-09-13 NOTE — TOC CM/SW Note (Signed)
 Transition of Care [facility]) - Inpatient Brief Assessment   Patient Details  Name: Melanie [name] MRN: [id number] Date of Birth: 31-Oct-1934  Transition of Care [facility]) CM/SW Contact:    Tom-Johnson, Harvest Muskrat, RN Phone Number: 09/13/2024, 11:17 AM   Clinical Narrative: d Patient presented to the ED with worsening Abdominal pains, found to have incidental Nephrolithiasis and UTI. Has hx of Iron  deficiency Anemia. GI and Urology consulted. Per GI, patient currently refusing any Ulcer medication and any Endoscopic evaluation or any other procedures. GI will sign off.Currently on IV abx.  CM spoke with patient at bedside about needs for post hospital transition. Patient states her daughter,[name] lives with her, has three children. Modified independent, has canes, stair chair, shower seat and garden tub at home.  PCP is Austin Mutton, MD ad uses [facility] on [address] and [facility] on [address].   No ICM needs or recommendations noted at this time.  Patient not Medically ready for discharge.  CM will continue to follow as patient progresses with care towards discharge.        Transition of Care Asessment: Insurance and Status: Insurance coverage has been reviewed Patient has primary care physician: Yes Home environment has been reviewed: Yes Prior level of function:: Modified Independent Prior/Current Home Services: No current home services Social Drivers of Health Review: SDOH reviewed no interventions necessary Readmission risk has been reviewed: Yes Transition of care needs: no transition of care needs at this time

## 2024-09-14 ENCOUNTER — Other Ambulatory Visit (HOSPITAL_COMMUNITY): Payer: Self-pay

## 2024-09-14 MED ORDER — TAMSULOSIN HCL 0.4 MG PO CAPS: 0.4000 mg | ORAL_CAPSULE | Freq: Every day | ORAL | 0 refills | Status: AC

## 2024-09-14 MED ORDER — PANTOPRAZOLE SODIUM 40 MG PO TBEC
DELAYED_RELEASE_TABLET | ORAL | 0 refills | Status: AC
  Filled 2024-09-14: qty 120, 90d supply, fill #0

## 2024-09-14 MED ORDER — CEFADROXIL 500 MG PO CAPS
1000.0000 mg | ORAL_CAPSULE | Freq: Two times a day (BID) | ORAL | 0 refills | Status: DC
  Filled 2024-09-14: qty 12, 3d supply, fill #0

## 2024-09-14 MED ORDER — CEFPODOXIME PROXETIL 200 MG PO TABS
200.0000 mg | ORAL_TABLET | Freq: Two times a day (BID) | ORAL | 0 refills | Status: AC
  Filled 2024-09-14: qty 6, 3d supply, fill #0

## 2024-09-14 NOTE — Discharge Summary (Signed)
 Physician Discharge Summary  Rolla Servidio FMW:[id number] DOB: Jun 06, 1935 DOA: 09/12/2024  PCP: Austin Mutton, MD  Admit date: 09/12/2024 Discharge date: 09/14/24  Admitted From: Home Disposition: Home Recommendations for Outpatient Follow-up:  Outpatient follow-up with PCP in 1 to 2 weeks Check CMP and CBC at follow-up Please follow up on the following pending results: None  Home Health: No need identified Equipment/Devices: No need identified  Discharge Condition: Stable CODE STATUS: Full code Diet Orders (From admission, onward)     Start     Ordered   09/12/24 1426  Diet Heart Room service appropriate? Yes; Fluid consistency: Thin  Diet effective now       Question Answer Comment  Room service appropriate? Yes   Fluid consistency: Thin      09/12/24 1426             Follow-up Information     Austin Mutton, MD. Schedule an appointment as soon as possible for a visit in 1 week(s).   Specialty: Internal Medicine Contact information: [address] [place] [address] [phone]                 Hospital course 89 year old F with PMH of IDA, duodenal and gastric ulcer, HTN and osteoporosis presented to ED with dysuria, cloudy and foul-smelling urine as well as epigastric pain, and admitted with working diagnosis of right ureteral stone/UTI, hypercalcemia and epigastric pain.  In ED, had stable vitals.  No fever or leukocytosis.  UA with nitrites, pyuria.  CT abdomen and pelvis showed large hiatal hernia and gastric antrum ulcer.  Patient was started on IV ceftriaxone.  Urology and GI consulted.  Urology recommended aggressive IV fluid hydration and Flomax and outpatient follow-up.  Patient was continued on IV ceftriaxone with resolution of UTI symptoms.  In regards to gastric ulcer/possible GI bleed, patient was started on IV Protonix .  She was evaluated by GI but refused endoscopic evaluation.  She has been advised to stop aspirin and meloxicam.  GI  signed off.    On the day of discharge, patient felt well and ready to go home.  She agreed to try p.o. Protonix  for gastric ulcer.  She is discharged on p.o. cefpodoxime for 3 more days to complete 5 days of empiric treatment for UTI.   See individual problem list below for more.   Problems addressed during this hospitalization Right ureteral stone/UTI: UA concerning.  Unfortunately, urine culture was not sent.  Symptoms resolved. -Received IV ceftriaxone for 2 days and discharged on p.o. cefpodoxime for 3 more days -Urology recommended Flomax and outpatient follow-up.  Hypercalcemia: Likely due to dehydration.  Vitamin D  level normal.  PTH slightly elevated to 99.  Resolved. -Check CMP at follow-up.   Epigastric abdominal pain/gastric ulcer: Prior history of duodenal and gastric ulcer.  Patient seems to be on aspirin and meloxicam at home.  Evaluated by GI and she refused endoscopic evaluation. -Discharged on p.o. Protonix  40 mg twice daily for 1 month followed by 40 mg daily afterward -Advised to avoid NSAID.   Essential hypertension: Normotensive. -Continue home meds.  Class II obesity Body mass index is 35.32 kg/m.           Consultations: Urology Gastroenterology  Time spent 35  minutes  Vital signs Vitals:   09/13/24 0801 09/13/24 1724 09/14/24 0428 09/14/24 0825  BP: 138/78 (!) 137/90 (!) 104/90 (!) 129/59  Pulse: 88 80 74 79  Temp: 98.3 F (36.8 C) 98.2 F (36.8 C) 98.1 F (  36.7 C) 98.4 F (36.9 C)  Resp: 19 19 16 18   Height:      Weight:      SpO2: 97% 97% 94% 98%  TempSrc:      BMI (Calculated):         Discharge exam  GENERAL: No apparent distress.  Nontoxic. HEENT: MMM.  Vision and hearing grossly intact.  NECK: Supple.  No apparent JVD.  RESP:  No IWOB.  Fair aeration bilaterally. CVS:  RRR. Heart sounds normal.  ABD/GI/GU: BS+. Abd soft, NTND.  MSK/EXT:  Moves extremities. No apparent deformity. No edema.  SKIN: no apparent skin lesion or  wound NEURO: Awake and alert. Oriented appropriately.  No apparent focal neuro deficit. PSYCH: Calm. Normal affect.   Discharge Instructions Discharge Instructions     Discharge instructions   Complete by: As directed    It has been a pleasure taking care of you!  You were hospitalized due to UTI and stomach ulcer for which you have been treated with antibiotics and acid reflux medication.  We are discharging you on more of these medications complete treatment course.  Is very important that you take these medications as prescribed.  Stop aspirin and meloxicam.  Follow-up with your primary care doctor in 1 to 2 weeks or sooner if needed.   Take care,   Increase activity slowly   Complete by: As directed       Allergies as of 09/14/2024       Reactions   Cephalexin Other (See Comments)   Affected pt's child -  Therefore , pt does not take Keflex.   Clindamycin/lincomycin    Codeine Nausea And Vomiting   Levofloxacin Other (See Comments)   Hallucinations.   Morphine  And Codeine Nausea And Vomiting   Other    Another antibiotic that starts with cina?   Quinolones Other (See Comments)   Hallucinations.   Sulfa Antibiotics Nausea And Vomiting   Tramadol Nausea And Vomiting   Amoxicillin Nausea Only   Fexofenadine Hcl Other (See Comments)   unknown reaction   Floxin [ocuflox] Other (See Comments)   unknown allergy   Neurontin [gabapentin] Palpitations   Septra [bactrim] Other (See Comments)   Unknown reaction        Medication List     STOP taking these medications    aspirin 81 MG tablet   meloxicam 15 MG tablet Commonly known as: MOBIC       TAKE these medications    cefpodoxime 200 MG tablet Commonly known as: VANTIN Take 1 tablet (200 mg total) by mouth 2 (two) times daily for 3 days.   cholecalciferol 1000 units tablet Commonly known as: VITAMIN D  Take 5,000 Units by mouth daily.   EYE DROPS OP Apply 1 drop to eye 3 (three) times daily. For dry  eyes,   folic acid  1 MG tablet Commonly known as: FOLVITE  TAKE 1 TABLET BY MOUTH EVERY DAY   metoprolol  tartrate 50 MG tablet Commonly known as: LOPRESSOR  Take 1 tablet (50 mg total) by mouth 2 (two) times daily.   pantoprazole  40 MG tablet Commonly known as: Protonix  Take 1 tablet (40 mg total) by mouth 2 (two) times daily for 30 days, THEN 1 tablet (40 mg total) daily. Start taking on: September 14, 2024   polyethylene glycol powder 17 GM/SCOOP powder Commonly known as: GLYCOLAX /MIRALAX  Take 17 g by mouth daily as needed.   tacrolimus 0.1 % ointment Commonly known as: PROTOPIC Apply 1 Application topically daily.  tamsulosin 0.4 MG Caps capsule Commonly known as: Flomax Take 1 capsule (0.4 mg total) by mouth daily. For kidney stone   Vitamin B12 500 MCG Tabs Take 500 mcg by mouth daily.         Procedures/Studies:   CT ABDOMEN PELVIS W CONTRAST Result Date: 09/12/2024 CLINICAL DATA:  Diffuse abdominal pain. EXAM: CT ABDOMEN AND PELVIS WITH CONTRAST TECHNIQUE: Multidetector CT imaging of the abdomen and pelvis was performed using the standard protocol following bolus administration of intravenous contrast. RADIATION DOSE REDUCTION: This exam was performed according to the departmental dose-optimization program which includes automated exposure control, adjustment of the mA and/or kV according to patient size and/or use of iterative reconstruction technique. CONTRAST:  75mL OMNIPAQUE  IOHEXOL  350 MG/ML SOLN COMPARISON:  12/26/2020 FINDINGS: Lower chest: Stable borderline cardiomegaly. Stable large hiatal hernia. Minimal calcified plaque over the descending thoracic aorta. Visualized lung bases demonstrate a tiny amount of left pleural fluid. Hepatobiliary: Previous cholecystectomy. Liver and biliary tree are unremarkable. Pancreas: Normal. Spleen: Normal. Adrenals/Urinary Tract: Adrenal glands are normal. Kidneys are normal size. Possible punctate nonobstructing stone over the  upper pole left kidney. Possible punctate stone over the lower pole right kidney. There is mild dilatation of the right intrarenal collecting system and renal pelvis as there is a 1 cm stone over the right renal pelvis causing this low-grade obstruction. There is an adjacent punctate stone also over the right renal pelvis as well as 1-2 mm stone over the proximal right ureter. 4-5 mm over the right mid ureter. Additional 2 mm stone over the mid to distal right ureter. Left ureter and bladder are normal. Stomach/Bowel: Large hiatal hernia unchanged as described above. Possible ulcer of the gastric antrum with ill definition of the adjacent gastric wall. Small bowel is unremarkable. Appendix is normal. Mild diverticulosis of the descending and sigmoid colon without active inflammation. Vascular/Lymphatic: Calcified plaque over the abdominal aorta which is otherwise normal in caliber. Remaining vascular structures are unremarkable. No adenopathy. Reproductive: Uterus and bilateral adnexa are unremarkable. Other: No free fluid or focal inflammatory change. Single surgical clip over the anterior left mid abdomen. Musculoskeletal: No acute abnormality. IMPRESSION: 1. Mild dilatation of the right intrarenal collecting system and renal pelvis as there is a 1 cm stone over the right renal pelvis. Additional 1-2 mm stone over the proximal right ureter and 4-5 mm stone over the right mid ureter as the stones likely account for patient's low-grade obstruction. 2 mm stone projects in the region of the mid to distal right ureter which may be an additional ureteral stone versus phlebolith. Possible punctate nonobstructing stone over the upper pole left kidney and lower pole right kidney. 2. Possible ulcer of the gastric antrum with ill definition of the adjacent gastric wall. Recommend correlation with endoscopy. 3. Stable large hiatal hernia. 4. Mild colonic diverticulosis without active inflammation. 5. Aortic atherosclerosis.  Aortic Atherosclerosis (ICD10-I70.0). Electronically Signed   By: Toribio Agreste M.D.   On: 09/12/2024 11:07       The results of significant diagnostics from this hospitalization (including imaging, microbiology, ancillary and laboratory) are listed below for reference.     Microbiology: No results found for this or any previous visit (from the past 240 hours).   Labs:  CBC: Recent Labs  Lab 09/12/24 0900 09/12/24 0937 09/13/24 0554  WBC 7.2  --  4.4  HGB 14.5 13.9 12.2  HCT 44.2 41.0 37.3  MCV 88.8  --  88.8  PLT 287  --  231  BMP &GFR Recent Labs  Lab 09/12/24 0900 09/12/24 0937 09/12/24 1450 09/12/24 1518 09/13/24 0554  NA 139 141  --   --  141  K 4.0 4.0  --   --  4.3  CL 105 106  --   --  113*  CO2 26  --   --   --  22  GLUCOSE 131* 127*  --   --  95  BUN 25* 25*  --   --  16  CREATININE 0.91 0.90  --   --  0.77  CALCIUM  11.0*  --   --  10.5* 9.9  MG  --   --  2.1  --   --   PHOS  --   --  2.4*  --   --    Estimated Creatinine Clearance: 50.9 mL/min (by C-G formula based on SCr of 0.77 mg/dL). Liver & Pancreas: Recent Labs  Lab 09/12/24 0900 09/13/24 0554  AST 16 17  ALT 16 15  ALKPHOS 95 70  BILITOT 0.7 0.5  PROT 6.7 5.2*  ALBUMIN 3.3* 2.5*   Recent Labs  Lab 09/12/24 0900  LIPASE 19   No results for input(s): AMMONIA in the last 168 hours. Diabetic: No results for input(s): HGBA1C in the last 72 hours. No results for input(s): GLUCAP in the last 168 hours. Cardiac Enzymes: No results for input(s): CKTOTAL, CKMB, CKMBINDEX, TROPONINI in the last 168 hours. No results for input(s): PROBNP in the last 8760 hours. Coagulation Profile: No results for input(s): INR, PROTIME in the last 168 hours. Thyroid Function Tests: No results for input(s): TSH, T4TOTAL, FREET4, T3FREE, THYROIDAB in the last 72 hours. Lipid Profile: No results for input(s): CHOL, HDL, LDLCALC, TRIG, CHOLHDL, LDLDIRECT in the  last 72 hours. Anemia Panel: No results for input(s): VITAMINB12, FOLATE, FERRITIN, TIBC, IRON , RETICCTPCT in the last 72 hours. Urine analysis:    Component Value Date/Time   COLORURINE YELLOW 09/12/2024 1127   APPEARANCEUR CLOUDY (A) 09/12/2024 1127   LABSPEC 1.025 09/12/2024 1127   LABSPEC 1.005 11/25/2011 1408   PHURINE 6.0 09/12/2024 1127   GLUCOSEU NEGATIVE 09/12/2024 1127   HGBUR LARGE (A) 09/12/2024 1127   BILIRUBINUR NEGATIVE 09/12/2024 1127   BILIRUBINUR Negative 11/25/2011 1408   KETONESUR NEGATIVE 09/12/2024 1127   PROTEINUR 100 (A) 09/12/2024 1127   NITRITE POSITIVE (A) 09/12/2024 1127   LEUKOCYTESUR MODERATE (A) 09/12/2024 1127   LEUKOCYTESUR Small 11/25/2011 1408   Sepsis Labs: Invalid input(s): PROCALCITONIN, LACTICIDVEN   SIGNED:  Mignon ONEIDA Bump, MD  [facility] 09/14/2024, 12:36 PM

## 2024-09-14 NOTE — Progress Notes (Signed)
 DISCHARGE NOTE HOME Melanie [name] to be discharged Home per MD order. Floor RN Discussed prescriptions and follow up appointments with the patient. Prescriptions given to patient; medication list explained in detail. Patient verbalized understanding.TOC meds picked up on way out  Skin clean, dry and intact without evidence of skin break down, no evidence of skin tears noted. IV catheter discontinued intact. Site without signs and symptoms of complications. Dressing and pressure applied. Pt denies pain at the site currently. No complaints noted.  Patient free of lines, drains, and wounds.   An After Visit Summary (AVS) was printed and given to the patient. Patient escorted via wheelchair, and discharged home via private auto.  [name], RN

## 2024-09-14 NOTE — TOC Transition Note (Signed)
 Transition of Care [facility]) - Discharge Note   Patient Details  Name: Melanie [name] MRN: [id number] Date of Birth: [birth year]  Transition of Care [facility]) CM/SW Contact:  Tom-Johnson, Harvest Muskrat, RN Phone Number: 09/14/2024, 10:01 AM   Clinical Narrative:     Patient is scheduled for discharge today.  Readmission Risk Assessment done. Outpatient f/u, hospital f/u and discharge instructions on AVS. Prescriptions sent to [facility] pharmacy and patient will receive meds prior discharge. No ICM needs or recommendations noted. Daughter, Tillman to transport at discharge.  No further ICM needs noted.       Final next level of care: Home/Self Care Barriers to Discharge: Barriers Resolved   Patient Goals and CMS Choice Patient states their goals for this hospitalization and ongoing recovery are:: To return home CMS Medicare.gov Compare Post Acute Care list provided to:: Patient Choice offered to / list presented to : NA      Discharge Placement                Patient to be transferred to facility by: Daughter Name of family member notified: [name]    Discharge Plan and Services Additional resources added to the After Visit Summary for                  DME Arranged: Bedside commode DME Agency: NA       HH Arranged: NA HH Agency: NA        Social Drivers of Health (SDOH) Interventions SDOH Screenings   Food Insecurity: No Food Insecurity (09/12/2024)  Housing: Low Risk  (09/12/2024)  Transportation Needs: No Transportation Needs (09/12/2024)  Utilities: Not At Risk (09/12/2024)  Social Connections: Unknown (09/12/2024)  Tobacco Use: Low Risk  (09/12/2024)     Readmission Risk Interventions    09/13/2024   11:17 AM  Readmission Risk Prevention Plan  Post Dischage Appt Complete  Medication Screening Complete  Transportation Screening Complete

## 2024-09-20 ENCOUNTER — Inpatient Hospital Stay (HOSPITAL_COMMUNITY): Admitting: Anesthesiology

## 2024-09-20 ENCOUNTER — Encounter (HOSPITAL_COMMUNITY): Payer: Self-pay | Admitting: Urology

## 2024-09-20 ENCOUNTER — Ambulatory Visit (HOSPITAL_COMMUNITY)
Admission: RE | Admit: 2024-09-20 | Discharge: 2024-09-20 | Disposition: A | Discharge status: Home / Self Care | Source: Ambulatory Visit | Attending: Urology | Admitting: Urology

## 2024-09-20 ENCOUNTER — Other Ambulatory Visit: Payer: Self-pay

## 2024-09-20 ENCOUNTER — Inpatient Hospital Stay (HOSPITAL_COMMUNITY)

## 2024-09-20 ENCOUNTER — Encounter (HOSPITAL_COMMUNITY)
Admission: RE | Disposition: A | Discharge status: Home / Self Care | Payer: Self-pay | Source: Ambulatory Visit | Attending: Urology

## 2024-09-20 ENCOUNTER — Other Ambulatory Visit: Payer: Self-pay | Admitting: Urology

## 2024-09-20 DIAGNOSIS — N201 Calculus of ureter: Secondary | ICD-10-CM | POA: Diagnosis present

## 2024-09-20 DIAGNOSIS — I10 Essential (primary) hypertension: Secondary | ICD-10-CM | POA: Diagnosis not present

## 2024-09-20 DIAGNOSIS — D509 Iron deficiency anemia, unspecified: Secondary | ICD-10-CM | POA: Diagnosis not present

## 2024-09-20 HISTORY — PX: CYSTOSCOPY W/ URETERAL STENT PLACEMENT: SHX1429

## 2024-09-20 SURGERY — CYSTOSCOPY, WITH RETROGRADE PYELOGRAM AND URETERAL STENT INSERTION
Anesthesia: General | Laterality: Right

## 2024-09-20 MED ORDER — STERILE WATER FOR IRRIGATION IR SOLN
Status: DC | PRN
  Administered 2024-09-20: 3000 mL

## 2024-09-20 MED ORDER — PROPOFOL 10 MG/ML IV BOLUS
INTRAVENOUS | Status: DC | PRN
  Administered 2024-09-20: 100 mg via INTRAVENOUS

## 2024-09-20 MED ORDER — DEXMEDETOMIDINE HCL IN NACL 80 MCG/20ML IV SOLN
INTRAVENOUS | Status: DC | PRN
  Administered 2024-09-20: 6 ug via INTRAVENOUS

## 2024-09-20 MED ORDER — CHLORHEXIDINE GLUCONATE 0.12 % MT SOLN
15.0000 mL | Freq: Once | OROMUCOSAL | Status: AC
  Administered 2024-09-20: 15 mL via OROMUCOSAL

## 2024-09-20 MED ORDER — LIDOCAINE HCL (PF) 2 % IJ SOLN
INTRAMUSCULAR | Status: AC
  Filled 2024-09-20: qty 5

## 2024-09-20 MED ORDER — LIDOCAINE HCL (CARDIAC) PF 100 MG/5ML IV SOSY
PREFILLED_SYRINGE | INTRAVENOUS | Status: DC | PRN
  Administered 2024-09-20: 100 mg via INTRATRACHEAL

## 2024-09-20 MED ORDER — LACTATED RINGERS IV SOLN: INTRAVENOUS | Status: DC

## 2024-09-20 MED ORDER — DEXAMETHASONE SODIUM PHOSPHATE 4 MG/ML IJ SOLN
INTRAMUSCULAR | Status: DC | PRN
  Administered 2024-09-20: 5 mg via INTRAVENOUS

## 2024-09-20 MED ORDER — EPHEDRINE 5 MG/ML INJ
INTRAVENOUS | Status: AC
  Filled 2024-09-20: qty 5

## 2024-09-20 MED ORDER — PROPOFOL 500 MG/50ML IV EMUL
INTRAVENOUS | Status: DC | PRN
  Administered 2024-09-20: 75 ug/kg/min via INTRAVENOUS

## 2024-09-20 MED ORDER — FENTANYL CITRATE (PF) 100 MCG/2ML IJ SOLN
INTRAMUSCULAR | Status: AC
  Filled 2024-09-20: qty 2

## 2024-09-20 MED ORDER — PROPOFOL 10 MG/ML IV BOLUS
INTRAVENOUS | Status: AC
  Filled 2024-09-20: qty 20

## 2024-09-20 MED ORDER — PROPOFOL 500 MG/50ML IV EMUL
INTRAVENOUS | Status: AC
  Filled 2024-09-20: qty 50

## 2024-09-20 MED ORDER — ONDANSETRON HCL 4 MG/2ML IJ SOLN
INTRAMUSCULAR | Status: DC | PRN
  Administered 2024-09-20: 4 mg via INTRAVENOUS

## 2024-09-20 MED ORDER — DOXYCYCLINE HYCLATE 100 MG PO TABS: 100.0000 mg | ORAL_TABLET | Freq: Two times a day (BID) | ORAL | 0 refills | Status: DC

## 2024-09-20 MED ORDER — ONDANSETRON HCL 4 MG/2ML IJ SOLN
INTRAMUSCULAR | Status: AC
  Filled 2024-09-20: qty 2

## 2024-09-20 MED ORDER — CEFAZOLIN SODIUM-DEXTROSE 2-4 GM/100ML-% IV SOLN
INTRAVENOUS | Status: AC
  Filled 2024-09-20: qty 100

## 2024-09-20 MED ORDER — FENTANYL CITRATE (PF) 100 MCG/2ML IJ SOLN
INTRAMUSCULAR | Status: DC | PRN
  Administered 2024-09-20: 50 ug via INTRAVENOUS

## 2024-09-20 MED ORDER — CEFAZOLIN SODIUM-DEXTROSE 2-4 GM/100ML-% IV SOLN
2.0000 g | Freq: Three times a day (TID) | INTRAVENOUS | Status: DC
  Administered 2024-09-20: 2 g via INTRAVENOUS

## 2024-09-20 MED ORDER — IOHEXOL 300 MG/ML  SOLN
INTRAMUSCULAR | Status: DC | PRN
  Administered 2024-09-20: 7 mL

## 2024-09-20 SURGICAL SUPPLY — 11 items
BAG URO CATCHER STRL LF (MISCELLANEOUS) ×1 IMPLANT
CATH URETL OPEN END 6FR 70 (CATHETERS) ×1 IMPLANT
CLOTH BEACON ORANGE TIMEOUT ST (SAFETY) ×1 IMPLANT
GLOVE BIO SURGEON STRL SZ 6.5 (GLOVE) ×1 IMPLANT
GOWN STRL REUS W/ TWL LRG LVL3 (GOWN DISPOSABLE) ×1 IMPLANT
GUIDEWIRE STR DUAL SENSOR (WIRE) ×1 IMPLANT
KIT TURNOVER KIT A (KITS) ×1 IMPLANT
MANIFOLD NEPTUNE II (INSTRUMENTS) ×1 IMPLANT
PACK CYSTO (CUSTOM PROCEDURE TRAY) ×1 IMPLANT
STENT URET 6FRX24 CONTOUR (STENTS) IMPLANT
TUBING CONNECTING 10 (TUBING) ×1 IMPLANT

## 2024-09-20 NOTE — Interval H&P Note (Signed)
 History and Physical Interval Note:  09/20/2024 3:21 PM  Melanie [name]  has presented today for surgery, with the diagnosis of RIGHT URETERAL STONE.  The various methods of treatment have been discussed with the patient and family. After consideration of risks, benefits and other options for treatment, the patient has consented to  Procedure(s): CYSTOSCOPY, WITH RETROGRADE PYELOGRAM AND URETERAL STENT INSERTION (Right) as a surgical intervention.  The patient's history has been reviewed, patient examined, no change in status, stable for surgery.  I have reviewed the patient's chart and labs.  Questions were answered to the patient's satisfaction.     Christana Angelica D Raymonda Pell

## 2024-09-20 NOTE — Anesthesia Procedure Notes (Signed)
 Procedure Name: LMA Insertion Date/Time: 09/20/2024 4:23 PM  Performed by: Judythe Tanda Aran, CRNAPre-anesthesia Checklist: Emergency Drugs available, Patient identified, Suction available and Patient being monitored Patient Re-evaluated:Patient Re-evaluated prior to induction Oxygen Delivery Method: Circle system utilized Preoxygenation: Pre-oxygenation with 100% oxygen Induction Type: IV induction Ventilation: Mask ventilation without difficulty LMA: LMA inserted LMA Size: 4.0 Number of attempts: 1 Placement Confirmation: positive ETCO2 and breath sounds checked- equal and bilateral Tube secured with: Tape Dental Injury: Teeth and Oropharynx as per pre-operative assessment

## 2024-09-20 NOTE — Discharge Instructions (Signed)
Post stent placement instructions   Definitions:  Ureter: The duct that transports urine from the kidney to the bladder. Stent: A plastic hollow tube that is placed into the ureter, from the kidney to the bladder to prevent the ureter from swelling shut.  General instructions:  Despite the fact that no skin incisions were used, the area around the ureter and bladder is raw and irritated. The stent is a foreign body which can further irritate the bladder wall. This irritation is manifested by increased frequency of urination, both day and night, and by an increase in the urge to urinate. In some, the urge to urinate is present almost always. Sometimes the urge is strong enough that you may not be able to stop your self from urinating. This can often be controlled with medication but does not occur in everyone. A stent can safely be left in place for 3 months or greater.  You may see some blood in your urine while the stent is in place and a few days afterward. Do not be alarmed, even if the urine is clear for a while. Get off your feet and drink lots of fluids until clearing occurs. If you start to pass clots or don't improve, call us.  Diet:  You may return to your normal diet immediately. Because of the raw surface of your bladder, alcohol, spicy foods, foods high in acid and drinks with caffeine may cause irritation or frequency and should be used in moderation. To keep your urine flowing freely and avoid constipation, drink plenty of fluids during the day (8-10 glasses). Tip: Avoid cranberry juice because it is very acidic.  Activity:  Your physical activity doesn't need to be restricted. However, if you are very active, you may see some blood in the urine. We suggest that you reduce your activity under the circumstances until the bleeding has stopped.  Bowels:  It is important to keep your bowels regular during the postoperative period. Straining with bowel movements can cause bleeding. A  bowel movement every other day is reasonable. Use a mild laxative if needed, such as milk of magnesia 2-3 tablespoons, or 2 Dulcolax tablets. Call if you continue to have problems. If you had been taking narcotics for pain, before, during or after your surgery, you may be constipated. Take a laxative if necessary.  Medication:  You should resume your pre-surgery medications unless told not to. In addition you may be given an antibiotic to prevent or treat infection. Antibiotics are not always necessary. All medication should be taken as prescribed until the bottles are finished unless you are having an unusual reaction to one of the drugs.  Problems you should report to [place]:  a. Fever greater than 101F. b. Heavy bleeding, or clots (see notes above about blood in urine). c. Inability to urinate. d. Drug reactions (hives, rash, nausea, vomiting, diarrhea). e. Severe burning or pain with urination that is not improving.

## 2024-09-20 NOTE — Transfer of Care (Signed)
 Immediate Anesthesia Transfer of Care Note  Patient: Melanie [name]  Procedure(s) Performed: CYSTOSCOPY, WITH RETROGRADE PYELOGRAM AND RIGHT URETERAL STENT INSERTION (Right)  Patient Location: PACU  Anesthesia Type:General  Level of Consciousness: drowsy  Airway & Oxygen Therapy: Patient Spontanous Breathing and Patient connected to face mask  Post-op Assessment: Report given to RN and Post -op Vital signs reviewed and stable  Post vital signs: Reviewed and stable  Last Vitals:  Vitals Value Taken Time  BP 125/64 09/20/24 16:53  Temp    Pulse 66 09/20/24 16:55  Resp 14 09/20/24 16:55  SpO2 100 % 09/20/24 16:55  Vitals shown include unfiled device data.  Last Pain:  Vitals:   09/20/24 1426  TempSrc:   PainSc: 0-No pain         Complications: No notable events documented.

## 2024-09-20 NOTE — H&P (Signed)
 History of present illness: Urolithiasis: - CT A/P 09/12/2024 with 1 cm stone of the right renal pelvis with mild dilation and additional 1-2 mm stone over proximal right ureter and 4-5 mm stone in the right mid ureter and 2 mm stone in the distal right ureter. Also present was gastric ulcer that was managed - KUB and renal ultrasound 09/20/2024 with 1 cm right renal pelvis stone and other stones present. - Urinalysis with nitrate positive. - She is complaining of right-sided flank pain, intermittent fevers and dysuria and chills.   Review of systems: A 12 point comprehensive review of systems was obtained and is negative unless otherwise stated in the history of present illness.  Patient Active Problem List   Diagnosis Date Noted   Epigastric abdominal pain 09/12/2024   Gastric ulcer 09/12/2024   Impacted cerumen of both ears 07/06/2024   Chronic rhinitis 07/06/2024   Hypertrophy of nasal turbinates 07/06/2024   Referred otalgia of both ears 07/06/2024   External hemorrhoids 12/26/2020   Abdominal pain, epigastric 12/26/2020   Loss of weight 12/26/2020   Rectal pain 12/26/2020   Rectal bleeding 12/26/2020   Skin rash 07/15/2018   Multiple complaints 05/13/2018   Osteoporosis 01/27/2017   Osteoarthritis of multiple joints 11/02/2016   Poor dentition 10/01/2015   Tick bite of left axillary region 04/10/2015   Angiodysplasia of colon 12/26/2013   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   Current Outpatient Medications on File Prior to Encounter  Medication Sig Dispense Refill   Carboxymethylcellulose Sodium (EYE DROPS OP) Apply 1 drop to eye 3 (three) times daily. For dry eyes,     cholecalciferol  (VITAMIN D ) 1000 units tablet Take  5,000 Units by mouth daily.     Cyanocobalamin  (VITAMIN B12) 500 MCG TABS Take 500 mcg by mouth daily.     folic acid  (FOLVITE ) 1 MG tablet TAKE 1 TABLET BY MOUTH EVERY DAY 90 tablet 3   metoprolol  (LOPRESSOR ) 50 MG tablet Take 1 tablet (50 mg total) by mouth 2 (two) times daily. 180 tablet 1   pantoprazole  (PROTONIX ) 40 MG tablet Take 1 tablet (40 mg total) by mouth 2 (two) times daily for 30 days, THEN 1 tablet (40 mg total) daily. 120 tablet 0   polyethylene glycol powder (GLYCOLAX /MIRALAX ) powder Take 17 g by mouth daily as needed. 1051 g 1   tacrolimus (PROTOPIC) 0.1 % ointment Apply 1 Application topically daily.     tamsulosin  (FLOMAX ) 0.4 MG CAPS capsule Take 1 capsule (0.4 mg total) by mouth daily. For kidney stone 30 capsule 0    Past Medical History:  Diagnosis Date   Allergy    Anemia    Arthritis    right hip is most painful and affects mobility as far as ambulating steps   AVM (arteriovenous malformation)    of cecum   Cataract    Complication of anesthesia    pt states she had memory problems for several days after   Diverticula of colon    Duodenal ulcer 2015   x 4 nonbleeding   Duodenitis    Hearing loss    Hiatal hernia    Hypercalcemia 08/18/2013   Hypertension    Iron  deficiency anemia, unspecified 12/25/2012  Past Surgical History:  Procedure Laterality Date   CATARACT EXTRACTION Bilateral    2008   CHOLECYSTECTOMY     laparoscopic    COLONOSCOPY N/A 12/26/2013   Procedure: COLONOSCOPY;  Surgeon: Gordy CHRISTELLA Starch, MD;  Location: [facility];  Service: Gastroenterology;  Laterality: N/A;   ESOPHAGOGASTRODUODENOSCOPY N/A 12/26/2013   Procedure: ESOPHAGOGASTRODUODENOSCOPY (EGD);  Surgeon: Gordy CHRISTELLA Starch, MD;  Location: [facility];  Service: Gastroenterology;  Laterality: N/A;   GIVENS CAPSULE STUDY N/A 12/26/2013   Procedure: GIVENS CAPSULE STUDY;  Surgeon: Gordy CHRISTELLA Starch, MD;  Location: [facility];  Service: Gastroenterology;  Laterality: N/A;  Due to  inablility to swallow capsule, will place Endoscopically into Small Bowel.   HOT HEMOSTASIS N/A 12/26/2013   Procedure: HOT HEMOSTASIS (ARGON PLASMA COAGULATION/BICAP);  Surgeon: Gordy CHRISTELLA Starch, MD;  Location: [facility];  Service: Gastroenterology;  Laterality: N/A;   TONSILLECTOMY      Social History   Tobacco Use   Smoking status: Never   Smokeless tobacco: Never  Vaping Use   Vaping status: Never Used  Substance Use Topics   Alcohol use: No   Drug use: No    Family History  Problem Relation Age of Onset   Asthma Other    Hypertension Other    Hyperlipidemia Other    Stroke Other    Diabetes Mother    Heart disease Mother    Diverticulosis Mother    Heart disease Father    Breast cancer Daughter 16   Colon cancer Neg Hx    Rectal cancer Neg Hx    Stomach cancer Neg Hx    Esophageal cancer Neg Hx     PE: There were no vitals filed for this visit. Patient appears to be in no acute distress  patient is alert and oriented x3 Atraumatic normocephalic head No increased work of breathing, no audible wheezes/rhonchi Abdomen is soft, nontender, nondistended, no CVA or suprapubic tenderness Lower extremities are symmetric without appreciable edema Grossly neurologically intact No identifiable skin lesions  No results for input(s): WBC, HGB, HCT in the last 72 hours. No results for input(s): NA, K, CL, CO2, GLUCOSE, BUN, CREATININE, CALCIUM  in the last 72 hours. No results for input(s): LABPT, INR in the last 72 hours. No results for input(s): LABURIN in the last 72 hours. Results for orders placed or performed in visit on 04/12/20  SARS Coronavirus 2 (TAT 6-24 hrs)     Status: None   Collection Time: 04/12/20 12:00 AM  Result Value Ref Range Status   SARS Coronavirus 2 RESULT: NEGATIVE  Final    Comment: RESULT: NEGATIVESARS-CoV-2 INTERPRETATION:A NEGATIVE  test result means that SARS-CoV-2 RNA was not present in the specimen above the limit  of detection of this test. This does not preclude a possible SARS-CoV-2 infection and should not be used as the  sole basis for patient management decisions. Negative results must be combined with clinical observations, patient history, and epidemiological information. Optimum specimen types and timing for peak viral levels during infections caused by SARS-CoV-2  have not been determined. Collection of multiple specimens or types of specimens may be necessary to detect virus. Improper specimen collection and handling, sequence variability under primers/probes, or organism present below the limit of detection may  lead to false negative results. Positive and negative predictive values of testing are highly dependent on prevalence. False negative test results are more likely when prevalence of disease is high.The expected result is NEGATIVE.Fact S heet for  Healthcare Providers: [employer] Sheet  for Patients: Https://[facility].org/ Reference Range - Negative    Assessment/Plan: Urolithiasis: -pt with multiple right ureteral/renal calculi in setting of UTI -she was seen in the office today by Dr. Selma who scheduled her for cystoscopy with right ureteral stent placement  Hayes Czaja D Danique Hartsough

## 2024-09-20 NOTE — Op Note (Signed)
 Preoperative diagnosis:  Right ureteral calculi Urinary tract infection   Postoperative diagnosis:  Right ureteral cacluli Urinary tract infection   Procedure:  Cystoscopy right ureteral stent placement 6Fr x 24 cm no string right retrograde pyelography with interpretation   Surgeon: Valli Shank, MD  Anesthesia: General  Complications: None  Intraoperative findings:   Normal urethra Bilateral orthotopic Uos right retrograde pyelography demonstrated a filling defect within the right ureter consistent with the patients known calculus without other abnormalities. Bladder mucosa consistent with cystitis  EBL: Minimal  Specimens: None  Indication: Melanie [name] is a 89 y.o. patient with multiple right ureteral calculi and large right renal pelvis stone also with UTI. After reviewing the management options for treatment, he elected to proceed with the above surgical procedure(s). We have discussed the potential benefits and risks of the procedure, side effects of the proposed treatment, the likelihood of the patient achieving the goals of the procedure, and any potential problems that might occur during the procedure or recuperation. Informed consent has been obtained.  Description of procedure:  The patient was taken to the operating room and general anesthesia was induced.  The patient was placed in the dorsal lithotomy position, prepped and draped in the usual sterile fashion, and preoperative antibiotics were administered. A preoperative time-out was performed.   Cystourethroscopy was performed.  The patients urethra was examined and was normal. The bladder was then systematically examined in its entirety. There was no evidence for any bladder tumors or stones.  Attention then turned to the rightureteral orifice and a ureteral catheter was used to intubate the ureteral orifice.  Urine was then obtained and sent for culture. Omnipaque  contrast was injected through the  ureteral catheter and a retrograde pyelogram was performed with findings as dictated above.  A 0.38 sensor guidewire was then advanced up the right ureter into the renal pelvis under fluoroscopic guidance.  The wire was then backloaded through the cystoscope and a ureteral stent was advance over the wire using Seldinger technique.  The stent was positioned appropriately under fluoroscopic and cystoscopic guidance.  The wire was then removed with an adequate stent curl noted in the renal pelvis as well as in the bladder.  The bladder was then emptied and the procedure ended.  The patient appeared to tolerate the procedure well and without complications.  The patient was able to be awakened and transferred to the recovery unit in satisfactory condition.    Valli Shank, M.D.

## 2024-09-20 NOTE — Anesthesia Preprocedure Evaluation (Addendum)
 Anesthesia Evaluation  Patient identified by MRN, date of birth, ID band Patient awake    Reviewed: Allergy & Precautions, NPO status , Patient's Chart, lab work & pertinent test results  History of Anesthesia Complications Negative for: history of anesthetic complications  Airway Mallampati: II  TM Distance: >3 FB Neck ROM: Full    Dental no notable dental hx. (+) Implants, Missing, Caps, Poor Dentition   Pulmonary    Pulmonary exam normal breath sounds clear to auscultation       Cardiovascular hypertension, Pt. on medications (-) angina (-) Past MI Normal cardiovascular exam Rhythm:Regular Rate:Normal     Neuro/Psych    GI/Hepatic Neg liver ROS, hiatal hernia, PUD,,,  Endo/Other  negative endocrine ROS    Renal/GU Renal diseaseLab Results      Component                Value               Date                      NA                       141                 09/13/2024                CL                       113 (H)             09/13/2024                K                        4.3                 09/13/2024                CO2                      22                  09/13/2024                BUN                      16                  09/13/2024                CREATININE               0.77                09/13/2024                GFRNONAA                 >60                 09/13/2024                  Musculoskeletal  (+) Arthritis ,    Abdominal   Peds  Hematology Lab Results      Component  Value               Date                      WBC                      4.4                 09/13/2024                HGB                      12.2                09/13/2024                HCT                      37.3                09/13/2024                MCV                      88.8                09/13/2024                PLT                      231                 09/13/2024               Anesthesia Other Findings All: see list  Reproductive/Obstetrics                              Anesthesia Physical Anesthesia Plan  ASA: 3  Anesthesia Plan: General   Post-op Pain Management: Minimal or no pain anticipated and Ofirmev  IV (intra-op)*   Induction: Intravenous  PONV Risk Score and Plan: 3 and Treatment may vary due to age or medical condition, Ondansetron , Propofol  infusion and TIVA  Airway Management Planned: LMA  Additional Equipment: None  Intra-op Plan:   Post-operative Plan: Extubation in OR  Informed Consent: I have reviewed the patients History and Physical, chart, labs and discussed the procedure including the risks, benefits and alternatives for the proposed anesthesia with the patient or authorized representative who has indicated his/her understanding and acceptance.     Dental advisory given  Plan Discussed with: CRNA and Surgeon  Anesthesia Plan Comments:          Anesthesia Quick Evaluation

## 2024-09-21 ENCOUNTER — Encounter (HOSPITAL_COMMUNITY): Payer: Self-pay | Admitting: Urology

## 2024-09-21 LAB — URINE CULTURE: Culture: NO GROWTH

## 2024-09-22 ENCOUNTER — Other Ambulatory Visit: Payer: Self-pay | Admitting: Urology

## 2024-09-22 NOTE — Anesthesia Postprocedure Evaluation (Signed)
 Anesthesia Post Note  Patient: Miamarie Moll  Procedure(s) Performed: CYSTOSCOPY, WITH RETROGRADE PYELOGRAM AND RIGHT URETERAL STENT INSERTION (Right)     Patient location during evaluation: PACU Anesthesia Type: General Level of consciousness: awake and alert Pain management: pain level controlled Vital Signs Assessment: post-procedure vital signs reviewed and stable Respiratory status: spontaneous breathing, nonlabored ventilation, respiratory function stable and patient connected to nasal cannula oxygen Cardiovascular status: blood pressure returned to baseline and stable Postop Assessment: no apparent nausea or vomiting Anesthetic complications: no   No notable events documented.  Last Vitals:  Vitals:   09/20/24 1730 09/20/24 1758  BP: (!) 125/91 (!) 141/80  Pulse: 66 76  Resp: 10 18  Temp:  36.4 C  SpO2: 93% 95%    Last Pain:  Vitals:   09/20/24 1758  TempSrc:   PainSc: 0-No pain   Pain Goal:                   Garnette DELENA Gab

## 2024-09-25 ENCOUNTER — Other Ambulatory Visit (HOSPITAL_COMMUNITY): Payer: Self-pay

## 2024-09-28 ENCOUNTER — Emergency Department (HOSPITAL_COMMUNITY)

## 2024-09-28 ENCOUNTER — Observation Stay (HOSPITAL_COMMUNITY)
Admission: EM | Admit: 2024-09-28 | Discharge: 2024-09-29 | Disposition: A | Attending: Family Medicine | Admitting: Family Medicine

## 2024-09-28 ENCOUNTER — Other Ambulatory Visit: Payer: Self-pay

## 2024-09-28 ENCOUNTER — Encounter (HOSPITAL_COMMUNITY): Payer: Self-pay

## 2024-09-28 ENCOUNTER — Observation Stay (HOSPITAL_COMMUNITY)

## 2024-09-28 DIAGNOSIS — Z8711 Personal history of peptic ulcer disease: Secondary | ICD-10-CM

## 2024-09-28 DIAGNOSIS — R7401 Elevation of levels of liver transaminase levels: Secondary | ICD-10-CM

## 2024-09-28 DIAGNOSIS — R519 Headache, unspecified: Secondary | ICD-10-CM | POA: Insufficient documentation

## 2024-09-28 DIAGNOSIS — I10 Essential (primary) hypertension: Secondary | ICD-10-CM | POA: Insufficient documentation

## 2024-09-28 DIAGNOSIS — N3020 Other chronic cystitis without hematuria: Secondary | ICD-10-CM | POA: Insufficient documentation

## 2024-09-28 DIAGNOSIS — R112 Nausea with vomiting, unspecified: Secondary | ICD-10-CM

## 2024-09-28 DIAGNOSIS — E66811 Obesity, class 1: Secondary | ICD-10-CM | POA: Insufficient documentation

## 2024-09-28 DIAGNOSIS — K831 Obstruction of bile duct: Secondary | ICD-10-CM | POA: Diagnosis not present

## 2024-09-28 DIAGNOSIS — K449 Diaphragmatic hernia without obstruction or gangrene: Secondary | ICD-10-CM | POA: Diagnosis not present

## 2024-09-28 DIAGNOSIS — K838 Other specified diseases of biliary tract: Secondary | ICD-10-CM | POA: Diagnosis not present

## 2024-09-28 DIAGNOSIS — R7989 Other specified abnormal findings of blood chemistry: Secondary | ICD-10-CM | POA: Diagnosis not present

## 2024-09-28 DIAGNOSIS — N200 Calculus of kidney: Secondary | ICD-10-CM

## 2024-09-28 DIAGNOSIS — N132 Hydronephrosis with renal and ureteral calculous obstruction: Secondary | ICD-10-CM | POA: Diagnosis not present

## 2024-09-28 DIAGNOSIS — N3000 Acute cystitis without hematuria: Secondary | ICD-10-CM

## 2024-09-28 DIAGNOSIS — R1110 Vomiting, unspecified: Secondary | ICD-10-CM | POA: Diagnosis present

## 2024-09-28 DIAGNOSIS — M810 Age-related osteoporosis without current pathological fracture: Secondary | ICD-10-CM | POA: Diagnosis not present

## 2024-09-28 LAB — CBC
HCT: 45.3 % (ref 36.0–46.0)
HCT: 42.3 % (ref 36.0–46.0)
Hemoglobin: 14.7 g/dL (ref 12.0–15.0)
Hemoglobin: 13.6 g/dL (ref 12.0–15.0)
MCH: 29 pg (ref 26.0–34.0)
MCH: 28.8 pg (ref 26.0–34.0)
MCHC: 32.5 g/dL (ref 30.0–36.0)
MCHC: 32.2 g/dL (ref 30.0–36.0)
MCV: 89.3 fL (ref 80.0–100.0)
MCV: 89.6 fL (ref 80.0–100.0)
Platelets: 311 K/uL (ref 150–400)
Platelets: 323 K/uL (ref 150–400)
RBC: 5.07 MIL/uL (ref 3.87–5.11)
RBC: 4.72 MIL/uL (ref 3.87–5.11)
RDW: 13.2 % (ref 11.5–15.5)
RDW: 13.2 % (ref 11.5–15.5)
WBC: 7.1 K/uL (ref 4.0–10.5)
WBC: 7.4 K/uL (ref 4.0–10.5)
nRBC: 0 % (ref 0.0–0.2)
nRBC: 0.3 % — ABNORMAL HIGH (ref 0.0–0.2)

## 2024-09-28 LAB — CREATININE, SERUM
Creatinine, Ser: 0.86 mg/dL (ref 0.44–1.00)
GFR, Estimated: >60 mL/min (ref >60)

## 2024-09-28 LAB — COMPREHENSIVE METABOLIC PANEL WITH GFR
ALT: 315 U/L — ABNORMAL HIGH (ref 0–44)
AST: 409 U/L — ABNORMAL HIGH (ref 15–41)
Albumin: 4 g/dL (ref 3.5–5.0)
Alkaline Phosphatase: 180 U/L — ABNORMAL HIGH (ref 38–126)
Anion gap: 9 (ref 5–15)
BUN: 26 mg/dL — ABNORMAL HIGH (ref 8–23)
CO2: 27 mmol/L (ref 22–32)
Calcium: 11.9 mg/dL — ABNORMAL HIGH (ref 8.9–10.3)
Chloride: 101 mmol/L (ref 98–111)
Creatinine, Ser: 0.93 mg/dL (ref 0.44–1.00)
GFR, Estimated: 59 mL/min — ABNORMAL LOW (ref >60)
Glucose, Bld: 169 mg/dL — ABNORMAL HIGH (ref 70–99)
Potassium: 4.3 mmol/L (ref 3.5–5.1)
Sodium: 137 mmol/L (ref 135–145)
Total Bilirubin: 0.8 mg/dL (ref 0.0–1.2)
Total Protein: 6.8 g/dL (ref 6.5–8.1)

## 2024-09-28 LAB — HEPATITIS PANEL, ACUTE
HCV Ab: NONREACTIVE
Hep A IgM: NONREACTIVE
Hep B C IgM: NONREACTIVE
Hepatitis B Surface Ag: NONREACTIVE

## 2024-09-28 LAB — SEDIMENTATION RATE: Sed Rate: 13 mm/h (ref 0–22)

## 2024-09-28 LAB — URINALYSIS, ROUTINE W REFLEX MICROSCOPIC
Bacteria, UA: NONE SEEN
Bilirubin Urine: NEGATIVE
Glucose, UA: 50 mg/dL — AB
Ketones, ur: NEGATIVE mg/dL
Nitrite: POSITIVE — AB
Protein, ur: 100 mg/dL — AB
RBC / HPF: >50 RBC/hpf (ref 0–5)
Specific Gravity, Urine: 1.046 — ABNORMAL HIGH (ref 1.005–1.030)
WBC, UA: >50 WBC/hpf (ref 0–5)
pH: 5 (ref 5.0–8.0)

## 2024-09-28 LAB — ACETAMINOPHEN LEVEL: Acetaminophen (Tylenol), Serum: <10 ug/mL — ABNORMAL LOW (ref 10–30)

## 2024-09-28 LAB — I-STAT CHEM 8, ED
BUN: 28 mg/dL — ABNORMAL HIGH (ref 8–23)
Calcium, Ion: 1.48 mmol/L — ABNORMAL HIGH (ref 1.15–1.40)
Chloride: 101 mmol/L (ref 98–111)
Creatinine, Ser: 1 mg/dL (ref 0.44–1.00)
Glucose, Bld: 170 mg/dL — ABNORMAL HIGH (ref 70–99)
HCT: 46 % (ref 36.0–46.0)
Hemoglobin: 15.6 g/dL — ABNORMAL HIGH (ref 12.0–15.0)
Potassium: 4.3 mmol/L (ref 3.5–5.1)
Sodium: 138 mmol/L (ref 135–145)
TCO2: 29 mmol/L (ref 22–32)

## 2024-09-28 LAB — SALICYLATE LEVEL: Salicylate Lvl: <7 mg/dL — ABNORMAL LOW (ref 7.0–30.0)

## 2024-09-28 LAB — LIPASE, BLOOD: Lipase: 14 U/L (ref 11–51)

## 2024-09-28 MED ORDER — TRAMADOL HCL 50 MG PO TABS
25.0000 mg | ORAL_TABLET | ORAL | Status: DC | PRN
  Administered 2024-09-28: 14:00:00 25 mg via ORAL
  Filled 2024-09-28: qty 1

## 2024-09-28 MED ORDER — SODIUM CHLORIDE 0.9 % IV SOLN
1.0000 g | Freq: Once | INTRAVENOUS | Status: AC
  Administered 2024-09-28: 08:00:00 1 g via INTRAVENOUS
  Filled 2024-09-28: qty 10

## 2024-09-28 MED ORDER — ENOXAPARIN SODIUM 40 MG/0.4ML IJ SOSY
40.0000 mg | PREFILLED_SYRINGE | INTRAMUSCULAR | Status: DC
  Administered 2024-09-28 – 2024-09-29 (×2): 40 mg via SUBCUTANEOUS
  Filled 2024-09-28 (×2): qty 0.4

## 2024-09-28 MED ORDER — IOHEXOL 350 MG/ML SOLN
75.0000 mL | Freq: Once | INTRAVENOUS | Status: AC | PRN
  Administered 2024-09-28: 06:00:00 75 mL via INTRAVENOUS

## 2024-09-28 MED ORDER — TAMSULOSIN HCL 0.4 MG PO CAPS
0.4000 mg | ORAL_CAPSULE | Freq: Every day | ORAL | Status: DC
  Administered 2024-09-28 – 2024-09-29 (×2): 0.4 mg via ORAL
  Filled 2024-09-28 (×2): qty 1

## 2024-09-28 MED ORDER — METOPROLOL TARTRATE 25 MG PO TABS
50.0000 mg | ORAL_TABLET | Freq: Two times a day (BID) | ORAL | Status: DC
  Administered 2024-09-28 – 2024-09-29 (×2): 50 mg via ORAL
  Filled 2024-09-28 (×3): qty 2

## 2024-09-28 MED ORDER — SODIUM CHLORIDE 0.9 % IV BOLUS
500.0000 mL | Freq: Once | INTRAVENOUS | Status: AC
  Administered 2024-09-28: 09:00:00 500 mL via INTRAVENOUS

## 2024-09-28 MED ORDER — PANTOPRAZOLE SODIUM 40 MG PO TBEC
40.0000 mg | DELAYED_RELEASE_TABLET | Freq: Two times a day (BID) | ORAL | Status: DC
  Administered 2024-09-28 – 2024-09-29 (×3): 40 mg via ORAL
  Filled 2024-09-28 (×3): qty 1

## 2024-09-28 MED ORDER — POLYETHYLENE GLYCOL 3350 17 GM/SCOOP PO POWD
17.0000 g | Freq: Every day | ORAL | Status: DC | PRN
  Administered 2024-09-28: 17 g via ORAL
  Filled 2024-09-28 (×2): qty 119

## 2024-09-28 MED ORDER — SODIUM CHLORIDE 0.9 % IV SOLN
1.0000 g | INTRAVENOUS | Status: DC
  Administered 2024-09-29: 1 g via INTRAVENOUS
  Filled 2024-09-28: qty 10

## 2024-09-28 MED ORDER — PANTOPRAZOLE SODIUM 40 MG PO TBEC: 40.0000 mg | DELAYED_RELEASE_TABLET | Freq: Every day | ORAL | Status: DC

## 2024-09-28 MED ORDER — ONDANSETRON 4 MG PO TBDP
4.0000 mg | ORAL_TABLET | Freq: Once | ORAL | Status: AC
  Administered 2024-09-28: 04:00:00 4 mg via ORAL
  Filled 2024-09-28: qty 1

## 2024-09-28 NOTE — H&P (Addendum)
 History and Physical    Patient: Melanie [name] FMW:[id number] DOB: 07-12-1935 DOA: 09/28/2024 DOS: the patient was seen and examined on 09/28/2024 PCP: Austin Mutton, MD  Patient coming from: Home  Chief Complaint:  Chief Complaint  Patient presents with   Emesis   HPI: Melanie [name] is a 89 y.o. female with medical history significant of IDA, duodenal and gastric ulcer, HTN, osteoporosis, and recent admission from 12/2-4 for complicated UTI iso R ureteral stone (tx IV CTX x2 dasys and discharged on cefpodixime x3 days) and epigastric pain c/f GIB iso prior duodenal/gastric ulcers (GI offered EGD, but pt refused) who p/w ongoing abd pain and found to have abnl UA c/f recurrent acute cystitis as well as transaminitis.   The patient had been experiencing pain and burning upon urination for several months. Initially, the primary care doctor diagnosed a urinary tract infection and prescribed antibiotics, which provided temporary relief. However, symptoms returned quickly after completing the medication. A visit to a urologist resulted in a urine culture indicating E. coli infection, and further examination revealed kidney stones and an ulcer. Despite repeated antibiotic treatments, the infection persisted, preventing surgical removal of the stones. The patient had a stent placed at [facility] on 12/10 to aid urine drainage but continued to experience severe symptoms, prompting today's ED visit.  In the ED, pt hypertensive. Labs notable for AST/ALT 409/315 (previously 17/15 on 12/3), and alkalkine phosphatase 180 (previously 70 on 12/3). Tylenol  <10 and salicylate <7. UA shows pyrua, bacteruria and nitrite positivity. Urine culture pending. CT abd/pelvis showed thickened endometrium, and interval placement of a right-sided nephroureteral stent with improvement in right hydronephrosis and hydroureter, new stranding about the right ureter and right renal collecting system that may reflect sequelae of  instrumentation, as well as right nephrolithiasis, including a persistent 9 mm stone in the central right renal pelvis and multiple additional small right renal stones. EDP started IV CTX and requested medicien for ongoing evaluation.      Review of Systems: As mentioned in the history of present illness. All other systems reviewed and are negative. Past Medical History:  Diagnosis Date   Allergy    Anemia    Arthritis    right hip is most painful and affects mobility as far as ambulating steps   AVM (arteriovenous malformation)    of cecum   Cataract    Complication of anesthesia    pt states she had memory problems for several days after   Diverticula of colon    Duodenal ulcer 2015   x 4 nonbleeding   Duodenitis    Hearing loss    Hiatal hernia    Hypercalcemia 08/18/2013   Hypertension    Iron  deficiency anemia, unspecified 12/25/2012   Past Surgical History:  Procedure Laterality Date   CATARACT EXTRACTION Bilateral    2008   CHOLECYSTECTOMY     laparoscopic    COLONOSCOPY N/A 12/26/2013   Procedure: COLONOSCOPY;  Surgeon: Gordy CHRISTELLA Starch, MD;  Location: [facility];  Service: Gastroenterology;  Laterality: N/A;   CYSTOSCOPY W/ URETERAL STENT PLACEMENT Right 09/20/2024   Procedure: CYSTOSCOPY, WITH RETROGRADE PYELOGRAM AND RIGHT URETERAL STENT INSERTION;  Surgeon: [name] Valli BIRCH, MD;  Location: [facility];  Service: Urology;  Laterality: Right;   ESOPHAGOGASTRODUODENOSCOPY N/A 12/26/2013   Procedure: ESOPHAGOGASTRODUODENOSCOPY (EGD);  Surgeon: Gordy CHRISTELLA Starch, MD;  Location: [facility];  Service: Gastroenterology;  Laterality: N/A;   GIVENS CAPSULE STUDY N/A 12/26/2013   Procedure: GIVENS CAPSULE STUDY;  Surgeon: Gordy  CHRISTELLA Starch, MD;  Location: [facility];  Service: Gastroenterology;  Laterality: N/A;  Due to inablility to swallow capsule, will place Endoscopically into Small Bowel.   HOT HEMOSTASIS N/A 12/26/2013   Procedure: HOT HEMOSTASIS (ARGON PLASMA COAGULATION/BICAP);   Surgeon: Gordy CHRISTELLA Starch, MD;  Location: [facility];  Service: Gastroenterology;  Laterality: N/A;   TONSILLECTOMY     Social History:  reports that she has never smoked. She has never used smokeless tobacco. She reports that she does not drink alcohol and does not use drugs.  Allergies[1]  Family History  Problem Relation Age of Onset   Asthma Other    Hypertension Other    Hyperlipidemia Other    Stroke Other    Diabetes Mother    Heart disease Mother    Diverticulosis Mother    Heart disease Father    Breast cancer Daughter 18   Colon cancer Neg Hx    Rectal cancer Neg Hx    Stomach cancer Neg Hx    Esophageal cancer Neg Hx     Prior to Admission medications  Medication Sig Start Date End Date Taking? Authorizing Provider  cefdinir (OMNICEF) 300 MG capsule Take 300 mg by mouth 2 (two) times daily.   Yes [provider]  Cyanocobalamin  (VITAMIN B12) 500 MCG TABS Take 500 mcg by mouth daily. 07/02/23  Yes Gorsuch, Ni, MD  folic acid  (FOLVITE ) 1 MG tablet TAKE 1 TABLET BY MOUTH EVERY DAY 09/23/20  Yes Gorsuch, Ni, MD  metoprolol  (LOPRESSOR ) 50 MG tablet Take 1 tablet (50 mg total) by mouth 2 (two) times daily. 11/02/16  Yes Jordan, [name], MD  ondansetron  (ZOFRAN -ODT) 4 MG disintegrating tablet Take 4 mg by mouth every 8 (eight) hours as needed for nausea or vomiting. 09/26/24  Yes [provider]  pantoprazole  (PROTONIX ) 40 MG tablet Take 1 tablet (40 mg total) by mouth 2 (two) times daily for 30 days, THEN 1 tablet (40 mg total) daily. 09/14/24 12/13/24 Yes Gonfa, Taye T, MD  phenazopyridine (PYRIDIUM) 100 MG tablet Take 100 mg by mouth 3 (three) times daily as needed for pain. 09/26/24  Yes [provider]  polyethylene glycol powder (GLYCOLAX /MIRALAX ) powder Take 17 g by mouth daily as needed. Patient taking differently: Take 17 g by mouth daily. 07/15/18  Yes Gorsuch, Ni, MD  Povidone (IVIZIA DRY EYES OP) Place 1 drop into both eyes 3 (three) times daily.    Yes [provider]  tacrolimus (PROTOPIC) 0.1 % ointment Apply 1 Application topically daily as needed (Eyelid/Ears). 05/10/24  Yes [provider]  traMADol  (ULTRAM ) 50 MG tablet Take 25 mg by mouth every 4 (four) hours as needed for moderate pain (pain score 4-6).   Yes [provider]  doxycycline  (VIBRA -TABS) 100 MG tablet Take 1 tablet (100 mg total) by mouth 2 (two) times daily. Patient not taking: Reported on 09/28/2024 09/20/24   Pace, Maryellen D, MD  estradiol (ESTRACE) 0.1 MG/GM vaginal cream Place 1 Applicatorful vaginally. Patient not taking: Reported on 09/28/2024 06/29/24   [provider]  tamsulosin  (FLOMAX ) 0.4 MG CAPS capsule Take 1 capsule (0.4 mg total) by mouth daily. For kidney stone Patient not taking: Reported on 09/28/2024 09/14/24   Kathrin Mignon DASEN, MD    Physical Exam: Vitals:   09/28/24 0800 09/28/24 0820 09/28/24 0900 09/28/24 0907  BP: 110/62  (!) 112/57   Pulse: 80  77   Resp: 17     Temp:  98.2 F (36.8 C)  98.4  F (36.9 C)  TempSrc:  Oral    SpO2: 96%  93%   Weight:      Height:       General: Alert, oriented x3, resting comfortably in no acute distress HEENT: EOMI, oropharynx clear, moist mucous membranes, hearing intact Neck: Trachea midline and no gross thyromegaly Respiratory: Lungs clear to auscultation bilaterally with normal respiratory effort; no w/r/r Cardiovascular: Regular rate and rhythm w/o m/r/g Abdomen: Soft, nontender, nondistended. Positive bowel sounds MSK: No obvious joint deformities or swelling Skin: No obvious rashes or lesions Neurologic: Awake, alert, spontaneously moves all extremities, strength intact Psychiatric: Appropriate mood and affect, conversational and cooperative   Data Reviewed:  Lab Results  Component Value Date   WBC 7.4 09/28/2024   HGB 13.6 09/28/2024   HCT 42.3 09/28/2024   MCV 89.6 09/28/2024   PLT 323 09/28/2024   Lab Results  Component Value Date   GLUCOSE  170 (H) 09/28/2024   CALCIUM  11.9 (H) 09/28/2024   NA 138 09/28/2024   K 4.3 09/28/2024   CO2 27 09/28/2024   CL 101 09/28/2024   BUN 28 (H) 09/28/2024   CREATININE 0.86 09/28/2024   Lab Results  Component Value Date   ALT 315 (H) 09/28/2024   AST 409 (H) 09/28/2024   ALKPHOS 180 (H) 09/28/2024   BILITOT 0.8 09/28/2024   Lab Results  Component Value Date   INR 0.98 11/12/2011   INR 0.98 08/22/2011   Radiology: CT ANGIO HEAD NECK W WO CM Result Date: 09/28/2024 EXAM: CTA Head and Neck with and without Intravenous Contrast. CT Head without Contrast. CLINICAL HISTORY: sudden onset headache, awakened from sleep TECHNIQUE: Axial CTA images of the head and neck performed with and without intravenous contrast. MIP reconstructed images were created and reviewed. Axial computed tomography images of the head/brain performed without intravenous contrast. Note: Per PQRS, the description of internal carotid artery percent stenosis, including 0 percent or normal exam, is based on [employer] Symptomatic Carotid Endarterectomy Trial (NASCET) criteria. Dose reduction technique was used including one or more of the following: automated exposure control, adjustment of mA and kV according to patient size, and/or iterative reconstruction. CONTRAST: With and without; 75 mL (iohexol  (OMNIPAQUE ) 350 MG/ML injection 75 mL IOHEXOL  350 MG/ML SOLN) COMPARISON: None provided. FINDINGS: CT HEAD: BRAIN: There is age-related atrophy and mild periventricular white matter disease. No acute intraparenchymal hemorrhage. No mass lesion. No CT evidence for acute territorial infarct. No midline shift or extra-axial collection. VENTRICLES: No hydrocephalus. ORBITS: The orbits are unremarkable. SINUSES AND MASTOIDS: The paranasal sinuses and mastoid air cells are clear. CTA NECK: COMMON CAROTID ARTERIES: No significant stenosis. No dissection or occlusion. INTERNAL CAROTID ARTERIES: There is mild calcific and noncalcific plaque  within the origin of the left internal carotid artery with less than 20% luminal stenosis. No dissection or occlusion. VERTEBRAL ARTERIES: No significant stenosis. No dissection or occlusion. CTA HEAD: ANTERIOR CEREBRAL ARTERIES: No significant stenosis. No occlusion. No aneurysm. MIDDLE CEREBRAL ARTERIES: No significant stenosis. No occlusion. No aneurysm. POSTERIOR CEREBRAL ARTERIES: No significant stenosis. No occlusion. No aneurysm. BASILAR ARTERY: No significant stenosis. No occlusion. No aneurysm. OTHER: There are moderate calcifications within the carotid siphons. There is calcific plaque within the carotid siphons bilaterally, with less than 20% stenosis bilaterally. SOFT TISSUES: No acute finding. No masses or lymphadenopathy. BONES: No acute osseous abnormality. IMPRESSION: 1. No acute intracranial hemorrhage or ischemic change. Mild age-related atrophy and chronic small vessel ischemic change. 2. Mild calcific and noncalcific plaque within  the origin of the left internal carotid artery with less than 20% luminal stenosis. 3. Calcific plaque within the carotid siphons bilaterally, with less than 20% stenosis bilaterally. Electronically signed by: Evalene Coho MD 09/28/2024 08:17 AM EST RP Workstation: [id number]   CT ABDOMEN PELVIS W CONTRAST Addendum Date: 09/28/2024 ** ADDENDUM #1 ** ADDENDUM: The endometrium is increased in thickness for a postmenopausal female, measuring 1.1 cm, axial image 65/11. Recommend further evaluation with pelvic sonogram. ---------------------------------------------------- Electronically signed by: Waddell Calk MD 09/28/2024 07:57 AM EST RP Workstation: [id number]   Result Date: 09/28/2024 ** ORIGINAL REPORT ** EXAM: CT ABDOMEN AND PELVIS WITH CONTRAST 09/28/2024 05:31:34 AM TECHNIQUE: CT of the abdomen and pelvis was performed with the administration of 75 mL of iohexol  (OMNIPAQUE ) 350 MG/ML injection. Multiplanar reformatted images are provided for review.  Automated exposure control, iterative reconstruction, and/or weight-based adjustment of the mA/kV was utilized to reduce the radiation dose to as low as reasonably achievable. COMPARISON: 09/12/2024 CLINICAL HISTORY: Abdominal pain, acute, nonlocalized. FINDINGS: LOWER CHEST: Large hiatal hernia which contains greater than 50% intrathoracic stomach. LIVER: Diffuse fatty infiltration of the liver. GALLBLADDER AND BILE DUCTS: Status post cholecystectomy. Common bile duct dilatation and mild intrahepatic bile duct dilatation is again noted. The proximal common bile duct measures up to 1.2 cm. No stone identified along the course of the CBD. SPLEEN: No acute abnormality. PANCREAS: Diffuse fatty infiltration of the pancreas. No main duct dilatation, inflammation, or mass. ADRENAL GLANDS: No acute abnormality. KIDNEYS, URETERS AND BLADDER: Punctate stone within the upper pole collecting system of the left kidney noted. There has been interval placement of a right-sided nephroureteral stent which appears to be in a satisfactory position. Interval improvement in previous right hydronephrosis and hydroureter. Stone within the central right renal pelvis is again seen measuring 9 mm, image 38/11. Previous stones along the course of the right ureter have resolved. Multiple approximately 4 small stones are identified within the mid and inferior pole collecting system of the right kidney, measuring up to 5 mm. New stranding about the right ureter and right renal collecting system is nonspecific and may reflect sequelae of instrumentation. Urinary tract infection cannot be excluded. Bladder appears normal. GI AND BOWEL: Interval improvement in wall thickening and edema about the gastric antrum. The small ureteral orifice along the gastric antrum is not confidently identified on the current exam. Small bowel loops are nondilated. The appendix is visualized and appears normal. Moderate colonic stool burden. Sigmoid diverticulosis  without signs of acute diverticulitis. PERITONEUM AND RETROPERITONEUM: No significant free fluid or fluid collections. No signs of pneumoperitoneum. VASCULATURE: Aortic atherosclerotic calcifications. LYMPH NODES: No lymphadenopathy. REPRODUCTIVE ORGANS: The endometrium is increased in thickness for a postmenopausal female, measuring 1.1 cm, axial image 65/11. No adnexal mass identified. BONES AND SOFT TISSUES: Thoracolumbar degenerative disc disease. No acute osseous abnormality. No focal soft tissue abnormality. IMPRESSION: 1. Interval placement of a right-sided nephroureteral stent with improvement in right hydronephrosis and hydroureter. New stranding about the right ureter and right renal collecting system that may reflect sequelae of instrumentation; urinary tract infection cannot be excluded. 2. Right nephrolithiasis, including a persistent 9 mm stone in the central right renal pelvis and multiple additional small right renal stones. Previous right ureteral calculi have resolved. 3. Interval resolution of wall edema involving the gastric antrum. Previously noted the ulcer crater is no longer visualized in this area. Electronically signed by: Waddell Calk MD 09/28/2024 06:19 AM EST RP Workstation: [id number]    Assessment and Plan: 77F h/o  IDA, duodenal and gastric ulcer, HTN, osteoporosis, and recent admission from 12/2-4 for complicated UTI iso R ureteral stone (tx IV CTX x2 dasys and discharged on cefpodixime x3 days) and epigastric pain c/f GIB iso prior duodenal/gastric ulcers (GI offered EGD, but pt refused) who p/w ongoing abd pain and found to have abnl UA c/f recurrent acute cystitis as well as transaminitis.   Abnl UA Recurrent acute cystitis R nephrolithiasis c/b R hydronephrosis s/p ureteral stenting -Urology consulted and spoke w/ Dr. Delia; recs; follow cultures and treat accordingly; consider treating for prolonged pyelonephritis 14d course given R ureteral stent or suppressive  abx such as keflex 250mg  daily until stent and stone removal -IV CTX 1g daily for now -PTA flomax  0.4mg  daily (pt not taking pta for unclear reasons) -F/u urine cultures and adjust abx accordingly  Tranaminitis Abd pain Tylenol  and salicylate neg -GI consulted; apprec eval/recs -PTA tramadol  prn -F/u acute hepatitis panel  Thickened endometrium on CT -F/u transvaginal US   HTN -PTA metoprolol  50mg  BID   Advance Care Planning:   Code Status: Full Code   Consults: Urology and GI  Family Communication: Daughter  Severity of Illness: The appropriate patient status for this patient is INPATIENT. Inpatient status is judged to be reasonable and necessary in order to provide the required intensity of service to ensure the patient's safety. The patient's presenting symptoms, physical exam findings, and initial radiographic and laboratory data in the context of their chronic comorbidities is felt to place them at high risk for further clinical deterioration. Furthermore, it is not anticipated that the patient will be medically stable for discharge from the hospital within 2 midnights of admission.   * I certify that at the point of admission it is my clinical judgment that the patient will require inpatient hospital care spanning beyond 2 midnights from the point of admission due to high intensity of service, high risk for further deterioration and high frequency of surveillance required.*   ------- I spent 56 minutes reviewing previous notes, at the bedside counseling/discussing the treatment plan, and performing clinical documentation.  Author: Marsha Ada, MD 09/28/2024 12:57 PM  For on call review [email or web address].      [1]  Allergies Allergen Reactions   Cephalexin Other (See Comments)    Affected pt's child -  Therefore , pt does not take Keflex.   Clindamycin/Lincomycin    Codeine Nausea And Vomiting   Levofloxacin Other (See Comments)    Hallucinations.   Morphine  And  Codeine Nausea And Vomiting   Other     Another antibiotic that starts with cina?   Quinolones Other (See Comments)    Hallucinations.   Sulfa Antibiotics Nausea And Vomiting   Tramadol  Nausea And Vomiting   Amoxicillin Nausea Only   Fexofenadine Hcl Other (See Comments)    unknown reaction   Floxin [Ocuflox] Other (See Comments)    unknown allergy   Neurontin [Gabapentin] Palpitations   Septra [Bactrim] Other (See Comments)    Unknown reaction

## 2024-09-28 NOTE — Consult Note (Signed)
 Consultation  Referring Provider: [facility]/Moore Primary Care Physician:  Austin Mutton, MD Primary Gastroenterologist:  Dr. Norva  Reason for Consultation: Acute LFT elevation  HPI: Melanie [name] is a 89 y.o. female with history of hypertension, iron  deficiency anemia, with history of previous ulcer disease, osteoporosis, known large hiatal hernia who presented to the emergency room yesterday with complaints of nausea and vomiting.  She had had a very recent admission earlier this month December 2 through December 4 for UTI and right ureteral stone.  She was treated with IV antibiotics at that time x 2 days and then discharged home on cefpodoxime  for 3 days.  During that admission she was complaining of epigastric pain, and CT imaging had suggested possible ulcer of the gastric antrum with ill-definition of the adjacent gastric wall.  GI was consulted, however she declined at that time to take a PPI, stated that she had adverse reactions to all of the PPIs in the past, and declined endoscopic evaluation.  She has been seen by urology since that last hospitalization and had ureteral stent placed on 09/20/2024 but continued to experience symptoms including some dysuria and then the onset of the nausea and vomiting and came back to the ER. She had taken a course of doxycycline  initiated 12 /07/2024   CT of the abdomen and pelvis earlier today, contrasted-is a large hiatal hernia unchanged with partially intrathoracic stomach, status postcholecystectomy, there is common bile duct dilation and mild intrahepatic ductal dilation with CBD measuring up to 1.2 cm, no stone identified, fatty infiltration of the pancreas.  There is a punctate stone within the upper pole collecting system of the left kidney and interval placement of a right sided nephro ureteral stent in satisfactory position along with improvement in the right hydronephrosis and hydroureter.  There are multiple small stones identified within  the mid and inferior pole collecting system of the right kidney and new stranding around the right ureter and right renal collecting system which is nonspecific.  Next also noted to have endometrial thickening and further evaluation with pelvic ultrasound recommended.  Labs today WBC 7.1/hemoglobin 14.7/hematocrit 45.3/platelets 311 Sodium 137/potassium 4.3/BUN 26/creatinine 0.93 T. bili 0.8/alk phos 180/AST 409/ALT 315 Sed rate 13 Urine culture pending List [place] acetaminophen  levels low Acute hepatitis panel  LFTs from 09/13/2024 -entirely normal  View of contrasted CT from 2022 showed mild enlargement of the extrahepatic biliary tree with common bile duct measuring 8 mm at that time felt probably related to postcholecystectomy state.   Past Medical History:  Diagnosis Date   Allergy    Anemia    Arthritis    right hip is most painful and affects mobility as far as ambulating steps   AVM (arteriovenous malformation)    of cecum   Cataract    Complication of anesthesia    pt states she had memory problems for several days after   Diverticula of colon    Duodenal ulcer 2015   x 4 nonbleeding   Duodenitis    Hearing loss    Hiatal hernia    Hypercalcemia 08/18/2013   Hypertension    Iron  deficiency anemia, unspecified 12/25/2012    Past Surgical History:  Procedure Laterality Date   CATARACT EXTRACTION Bilateral    2008   CHOLECYSTECTOMY     laparoscopic    COLONOSCOPY N/A 12/26/2013   Procedure: COLONOSCOPY;  Surgeon: Gordy CHRISTELLA Starch, MD;  Location: [facility];  Service: Gastroenterology;  Laterality: N/A;   CYSTOSCOPY W/ URETERAL STENT  PLACEMENT Right 09/20/2024   Procedure: CYSTOSCOPY, WITH RETROGRADE PYELOGRAM AND RIGHT URETERAL STENT INSERTION;  Surgeon: [name] Valli BIRCH, MD;  Location: [facility];  Service: Urology;  Laterality: Right;   ESOPHAGOGASTRODUODENOSCOPY N/A 12/26/2013   Procedure: ESOPHAGOGASTRODUODENOSCOPY (EGD);  Surgeon: Gordy CHRISTELLA Starch, MD;  Location: [facility];  Service: Gastroenterology;  Laterality: N/A;   GIVENS CAPSULE STUDY N/A 12/26/2013   Procedure: GIVENS CAPSULE STUDY;  Surgeon: Gordy CHRISTELLA Starch, MD;  Location: [facility];  Service: Gastroenterology;  Laterality: N/A;  Due to inablility to swallow capsule, will place Endoscopically into Small Bowel.   HOT HEMOSTASIS N/A 12/26/2013   Procedure: HOT HEMOSTASIS (ARGON PLASMA COAGULATION/BICAP);  Surgeon: Gordy CHRISTELLA Starch, MD;  Location: [facility];  Service: Gastroenterology;  Laterality: N/A;   TONSILLECTOMY      Prior to Admission medications  Medication Sig Start Date End Date Taking? Authorizing Provider  cefdinir (OMNICEF) 300 MG capsule Take 300 mg by mouth 2 (two) times daily.   Yes [provider]  Cyanocobalamin  (VITAMIN B12) 500 MCG TABS Take 500 mcg by mouth daily. 07/02/23  Yes Gorsuch, Ni, MD  folic acid  (FOLVITE ) 1 MG tablet TAKE 1 TABLET BY MOUTH EVERY DAY 09/23/20  Yes Gorsuch, Ni, MD  metoprolol  (LOPRESSOR ) 50 MG tablet Take 1 tablet (50 mg total) by mouth 2 (two) times daily. 11/02/16  Yes Jordan, [name], MD  ondansetron  (ZOFRAN -ODT) 4 MG disintegrating tablet Take 4 mg by mouth every 8 (eight) hours as needed for nausea or vomiting. 09/26/24  Yes [provider]  pantoprazole  (PROTONIX ) 40 MG tablet Take 1 tablet (40 mg total) by mouth 2 (two) times daily for 30 days, THEN 1 tablet (40 mg total) daily. 09/14/24 12/13/24 Yes Gonfa, Taye T, MD  phenazopyridine (PYRIDIUM) 100 MG tablet Take 100 mg by mouth 3 (three) times daily as needed for pain. 09/26/24  Yes [provider]  polyethylene glycol powder (GLYCOLAX /MIRALAX ) powder Take 17 g by mouth daily as needed. Patient taking differently: Take 17 g by mouth daily. 07/15/18  Yes Gorsuch, Ni, MD  Povidone (IVIZIA DRY EYES OP) Place 1 drop into both eyes 3 (three) times daily.   Yes [provider]  tacrolimus (PROTOPIC) 0.1 % ointment Apply 1 Application topically daily as needed (Eyelid/Ears).  05/10/24  Yes [provider]  traMADol  (ULTRAM ) 50 MG tablet Take 25 mg by mouth every 4 (four) hours as needed for moderate pain (pain score 4-6).   Yes [provider]  doxycycline  (VIBRA -TABS) 100 MG tablet Take 1 tablet (100 mg total) by mouth 2 (two) times daily. Patient not taking: Reported on 09/28/2024 09/20/24   Pace, Maryellen D, MD  estradiol (ESTRACE) 0.1 MG/GM vaginal cream Place 1 Applicatorful vaginally. Patient not taking: Reported on 09/28/2024 06/29/24   [provider]  tamsulosin  (FLOMAX ) 0.4 MG CAPS capsule Take 1 capsule (0.4 mg total) by mouth daily. For kidney stone Patient not taking: Reported on 09/28/2024 09/14/24   Gonfa, Taye T, MD    Current Facility-Administered Medications  Medication Dose Route Frequency Provider Last Rate Last Admin   [START ON 09/29/2024] cefTRIAXone  (ROCEPHIN ) 1 g in sodium chloride  0.9 % 100 mL IVPB  1 g Intravenous Q24H Georgina Basket, MD       enoxaparin  (LOVENOX ) injection 40 mg  40 mg Subcutaneous Q24H Georgina Basket, MD   40 mg at 09/28/24 1054   metoprolol  tartrate (LOPRESSOR ) tablet 50 mg  50 mg Oral BID Moore, Willie, MD   50 mg at  09/28/24 1401   pantoprazole  (PROTONIX ) EC tablet 40 mg  40 mg Oral BID Georgina Basket, MD   40 mg at 09/28/24 1401   polyethylene glycol powder (GLYCOLAX /MIRALAX ) container 17 g  17 g Oral Daily PRN Georgina Basket, MD       tamsulosin  (FLOMAX ) capsule 0.4 mg  0.4 mg Oral Daily Georgina Basket, MD   0.4 mg at 09/28/24 1401   traMADol  (ULTRAM ) tablet 25 mg  25 mg Oral Q4H PRN Georgina Basket, MD   25 mg at 09/28/24 1402    Allergies as of 09/28/2024 - Review Complete 09/28/2024  Allergen Reaction Noted   Cephalexin Other (See Comments) 08/22/2011   Clindamycin/lincomycin  04/19/2015   Codeine Nausea And Vomiting 10/21/2011   Levofloxacin Other (See Comments) 04/16/2011   Morphine  and codeine Nausea And Vomiting 10/21/2011   Other  08/22/2011   Quinolones Other (See Comments)  11/06/2011   Sulfa antibiotics Nausea And Vomiting 11/06/2011   Tramadol  Nausea And Vomiting 01/01/2015   Amoxicillin Nausea Only 11/12/2011   Fexofenadine hcl Other (See Comments) 11/12/2011   Floxin [ocuflox] Other (See Comments) 11/12/2011   Neurontin [gabapentin] Palpitations 11/12/2011   Septra [bactrim] Other (See Comments) 11/12/2011    Family History  Problem Relation Age of Onset   Asthma Other    Hypertension Other    Hyperlipidemia Other    Stroke Other    Diabetes Mother    Heart disease Mother    Diverticulosis Mother    Heart disease Father    Breast cancer Daughter 28   Colon cancer Neg Hx    Rectal cancer Neg Hx    Stomach cancer Neg Hx    Esophageal cancer Neg Hx     Social History   Socioeconomic History   Marital status: Divorced    Spouse name: Not on file   Number of children: 3   Years of education: Not on file   Highest education level: Not on file  Occupational History   Occupation: Retired  Tobacco Use   Smoking status: Never   Smokeless tobacco: Never  Vaping Use   Vaping status: Never Used  Substance and Sexual Activity   Alcohol use: No   Drug use: No   Sexual activity: Not Currently  Other Topics Concern   Not on file  Social History Narrative   Not on file   Social Drivers of Health   Tobacco Use: Low Risk (09/28/2024)   Patient History    Smoking Tobacco Use: Never    Smokeless Tobacco Use: Never    Passive Exposure: Not on file  Financial Resource Strain: Not on file  Food Insecurity: No Food Insecurity (09/28/2024)   Epic    Worried About Programme Researcher, Broadcasting/film/video in the Last Year: Never true    [employer] of Food in the Last Year: Never true  Transportation Needs: No Transportation Needs (09/28/2024)   Epic    Lack of Transportation (Medical): No    Lack of Transportation (Non-Medical): No  Physical Activity: Not on file  Stress: Not on file  Social Connections: Moderately Integrated (09/28/2024)   Social Connection and  Isolation Panel    Frequency of Communication with Friends and Family: More than three times a week    Frequency of Social Gatherings with Friends and Family: More than three times a week    Attends Religious Services: 1 to 4 times per year    Active Member of [employer] or Organizations: Yes    Attends [employer]  or Organization Meetings: 1 to 4 times per year    Marital Status: Divorced  Intimate Partner Violence: Not At Risk (09/28/2024)   Epic    Fear of Current or Ex-Partner: No    Emotionally Abused: No    Physically Abused: No    Sexually Abused: No  Depression (PHQ2-9): Not on file  Alcohol Screen: Not on file  Housing: Low Risk (09/28/2024)   Epic    Unable to Pay for Housing in the Last Year: No    Number of Times Moved in the Last Year: 0    Homeless in the Last Year: No  Utilities: Not At Risk (09/28/2024)   Epic    Threatened with loss of utilities: No  Health Literacy: Not on file    Review of Systems: Pertinent positive and negative review of systems were noted in the above HPI section.  All other review of systems was otherwise negative.   Physical Exam: Vital signs in last 24 hours: Temp:  [97.6 F (36.4 C)-98.5 F (36.9 C)] 97.6 F (36.4 C) (12/18 1531) Pulse Rate:  [54-85] 81 (12/18 1531) Resp:  [16-18] 16 (12/18 1531) BP: (110-156)/(57-103) 110/76 (12/18 1531) SpO2:  [93 %-99 %] 98 % (12/18 1531) Weight:  [83.5 kg] 83.5 kg (12/18 0408) Last BM Date : 09/27/24 General:   Alert,  Well-developed, well-nourished, elderly white female, pleasant and cooperative in NAD Head:  Normocephalic and atraumatic. Eyes:  Sclera clear, no icterus.   Conjunctiva pink. Ears:  Normal auditory acuity. Nose:  No deformity, discharge,  or lesions. Mouth:  No deformity or lesions.   Neck:  Supple; no masses or thyromegaly. Lungs:  Clear throughout to auscultation.   No wheezes, crackles, or rhonchi.  Heart:  Regular rate and rhythm; no murmurs, clicks, rubs,  or gallops. Abdomen:   Soft,nontender, BS active,nonpalp mass or hsm.   Rectal: Not done Msk:  Symmetrical without gross deformities. . Pulses:  Normal pulses noted. Extremities:  Without clubbing or edema. Neurologic:  Alert and  oriented x4;  grossly normal neurologically. Skin:  Intact without significant lesions or rashes.. Psych:  Alert and cooperative. Normal mood and affect.  Intake/Output from previous day: No intake/output data recorded. Intake/Output this shift: No intake/output data recorded.  Lab Results: Recent Labs    09/28/24 0419 09/28/24 0514 09/28/24 1056  WBC 7.1  --  7.4  HGB 14.7 15.6* 13.6  HCT 45.3 46.0 42.3  PLT 311  --  323   BMET Recent Labs    09/28/24 0419 09/28/24 0514 09/28/24 1056  NA 137 138  --   K 4.3 4.3  --   CL 101 101  --   CO2 27  --   --   GLUCOSE 169* 170*  --   BUN 26* 28*  --   CREATININE 0.93 1.00 0.86  CALCIUM  11.9*  --   --    LFT Recent Labs    09/28/24 0419  PROT 6.8  ALBUMIN 4.0  AST 409*  ALT 315*  ALKPHOS 180*  BILITOT 0.8   PT/INR No results for input(s): LABPROT, INR in the last 72 hours. Hepatitis Panel Recent Labs    09/28/24 1432  HEPBSAG NON REACTIVE  HCVAB NON REACTIVE  HEPAIGM PENDING  HEPBIGM NON REACTIVE     IMPRESSION:  #69 89 year old white female who we are asked to evaluate for acute LFT elevation initially noted on admission earlier today when she presented with nausea and vomiting, and persistent urinary symptoms. Diagnosed  with recurrent acute cystitis currently persistent ureteral lithiasis on imaging and stranding around the right ureter which may reflect recent instrumentation and consider pyelonephritis  She had just been given a course of doxycycline , 09/20/2024. This drug can be associated with acute hepatotoxicity, and suspect she may have a drug-induced liver injury( DI LI)  She also has a diet common bile duct noted on CT imaging which is larger than when last imaged in 2022-she status  postcholecystectomy and this may just reflect postcholecystectomy state warrant further imaging with MRI.   #2 previous history of peptic ulcer disease #3 huge hiatal hernia with partially intrathoracic stomach-chronic previously documented Cameron erosions contributing to iron  deficiency anemia.  Patient had previously declined to be on PPI it looks as if she was discharged on Protonix  at the time of this last admission  #4 recent hospitalization for urinary tract infection and right ureteral lithiasis/hydronephrosis, has had a couple of courses of antibiotics and also underwent right ureteral stent on 09/20/2024  #5 thickened endometrium on CT-pelvic ultrasound being done today    PLAN: Trend LFTs Will likely need MRI/MRCP this admission, will see what labs look like in a.m. prior to ordering. Do not give further doxycycline -currently on ceftriaxone   Continue Protonix  40 mg p.o. daily  GI will follow along with you    Siriyah Ambrosius EsterwoodPA-C  09/28/2024, 4:00 PM

## 2024-09-28 NOTE — ED Provider Notes (Signed)
 Patient here with sudden onset headache that started tonight.  Has had associated nausea and vomiting.    Headache is overlying right temple and eye.  No rash.  Eye and vision normal.  No injection or steam pupil.  Fairly recent dental extraction appears to be well healed.    Consider ocular migraine, GCA, SAH, shingles, dental pain.  No hx of migraines.  No jaw claudication, will check ESR.  Patient symptoms started within a few hours of arrival, so CTA should approach near 100% sensitivity to rule out SAH. No rash seen on exam, doubt shingles.  Could have referred dental pain, but no obvious abscess wounds.   LFTs markedly elevated compared to earlier this month.  Will add CT.  Patient seen in conjunction with Lal, PA-C.  See Lal's note for further details.  Patient discussed with Dr. Trine.   Vicky Charleston, PA-C 09/28/24 [id number]    Trine Raynell Moder, MD 09/28/24 [phone]

## 2024-09-28 NOTE — ED Provider Notes (Signed)
°  Physical Exam  BP 134/71   Pulse 85   Temp 97.7 F (36.5 C)   Resp 18   Ht 5' 2 (1.575 m)   Wt 83.5 kg   SpO2 98%   BMI 33.65 kg/m   Physical Exam Vitals and nursing note reviewed.  Constitutional:      General: She is not in acute distress.    Appearance: Normal appearance. She is not ill-appearing or diaphoretic.  HENT:     Head: Normocephalic and atraumatic.  Eyes:     Extraocular Movements: Extraocular movements intact.     Conjunctiva/sclera: Conjunctivae normal.  Cardiovascular:     Rate and Rhythm: Normal rate and regular rhythm.  Pulmonary:     Effort: Pulmonary effort is normal. No respiratory distress.  Abdominal:     General: Abdomen is flat. There is no distension.     Tenderness: There is no abdominal tenderness.  Skin:    General: Skin is warm.  Neurological:     General: No focal deficit present.     Mental Status: She is alert and oriented to person, place, and time. Mental status is at baseline.     Procedures  Procedures  ED Course / MDM   Clinical Course as of 09/28/24 0809  Thu Sep 28, 2024  9366 Headache sudden onset starting last night accompanied with n/v. R sided temple. Waiting on CT angio for r/o of subarachnoid and ESR for GCA. Admit regardless for transaminitis. Poor detention.   [CB]    Clinical Course User Index [CB] Beola Terrall RAMAN, PA-C   Medical Decision Making Amount and/or Complexity of Data Reviewed Labs: ordered.  Risk Prescription drug management.   See previous provider note.  Patient care transferred over from [facility].  At time of handoff, awaiting CT angio, and ESR to rule out subarachnoid hemorrhage and GCA.  However anticipating admission regardless for new onset transaminitis.  Patient care discussed with Dr. Marsha Ada, hospitalist, who accepted patient care.  Short:  Patient is an 89 year old female presenting ED today with acute onset right sided temple pain radiating to right neck that woke  her up from sleep.  Notably having 1 episode of nausea and vomiting directly after awakening.  Noted to have had new transaminitis as well as being positive for UTI.  On physical exam, patient is in no acute distress, afebrile, alert and orient x 4, speaking in full sentences, nontachypneic, nontachycardic.  No neurological deficits, eye is normal/PERRL/EOM intact.  Currently has all symptoms abated.  Patient noting to have had a history of chronic cystitis for which she is currently taking cefdinir, having had urologic stents placed by Dr. [name] with [facility] on 12/10.  Reporting that she has been having cystitis since September.  Still endorsing intermittent dysuria.  Does not drink alcohol, does not take Tylenol .      Beola Terrall RAMAN, PA-C 09/28/24 [phone]

## 2024-09-28 NOTE — ED Notes (Signed)
 Oral temp @0906  after Rocephin  98.4 F.

## 2024-09-28 NOTE — TOC CM/SW Note (Signed)
 Transition of Care [facility]) - Inpatient Brief Assessment   Patient Details  Name: Raylan Troiani MRN: [id number] Date of Birth: 07-01-1935  [facility] CM/SW Contact:    Lauraine FORBES Saa, LCSWA Phone Number: 09/28/2024, 2:24 PM   Clinical Narrative:  2:24 PM Per chart review, patient resides at home with child(ren). Patient has a PCP and insurance. Patient does not have SNF or HH history. Patient has DME (BSC, lift chair, single point cane, toilet grab bars) history. Patient's preferred pharmacy's are [facility], [facility] [address] and [facility]. No TOC needs identified at this time. TOC will continue to follow.  Transition of Care Asessment: Insurance and Status: Insurance coverage has been reviewed Patient has primary care physician: Yes Home environment has been reviewed: Private Residence Prior level of function:: N/A Prior/Current Home Services: No current home services Social Drivers of Health Review: SDOH reviewed no interventions necessary Readmission risk has been reviewed: Yes (Currently Observation Status) Transition of care needs: no transition of care needs at this time

## 2024-09-28 NOTE — ED Notes (Signed)
 Patient ambulatory to bathroom with daughter assist. Clean catch obtained and back to bed without changes in well being.

## 2024-09-28 NOTE — ED Notes (Signed)
 Patient transported to CT

## 2024-09-28 NOTE — Plan of Care (Signed)

## 2024-09-28 NOTE — ED Triage Notes (Signed)
 Pt POV with daughter from home d/t Dry Heaving  and pain on left side of head (including eye).   Pt is being seen E. Coli, kidney stones and stomach ulcers.

## 2024-09-28 NOTE — ED Provider Notes (Signed)
 [place] [facility] AT [facility] Provider Note   CSN: [id number] Arrival date & time: 09/28/24  9643     Patient presents with: Emesis   Melanie [name] is a 89 y.o. female.  PMH of IDA, duodenal and gastric ulcer, HTN and osteoporosis present to ED with right-sided temple, eye, cheek, mouth pain that started about 3 hours ago.  Recent dental extraction.  She denies blurry vision. Pt also endorses nausea. She underwent cystoscopy with right ureteral stent placement 6Fr x 24 cm no string, right retrograde pyelography with interpretation on 09/20/2024.  Denies pain with chewing, numbness, tingling, weakness, vision difficulties, rash, photophobia, chest pain, abdominal pain, shortness of breath or any other symptoms at this time. No hx of migraines. No alcohol or drug use.  Does not use Tylenol , ibuprofen.       Emesis      Prior to Admission medications  Medication Sig Start Date End Date Taking? Authorizing Provider  Carboxymethylcellulose Sodium (EYE DROPS OP) Apply 1 drop to eye 3 (three) times daily. For dry eyes,    [provider]  cholecalciferol  (VITAMIN D ) 1000 units tablet Take 5,000 Units by mouth daily.    [provider]  Cyanocobalamin  (VITAMIN B12) 500 MCG TABS Take 500 mcg by mouth daily. 07/02/23   Lonn Hicks, MD  doxycycline  (VIBRA -TABS) 100 MG tablet Take 1 tablet (100 mg total) by mouth 2 (two) times daily. 09/20/24   [name] Valli BIRCH, MD  folic acid  (FOLVITE ) 1 MG tablet TAKE 1 TABLET BY MOUTH EVERY DAY 09/23/20   Lonn Hicks, MD  metoprolol  (LOPRESSOR ) 50 MG tablet Take 1 tablet (50 mg total) by mouth 2 (two) times daily. 11/02/16   Jordan, [name], MD  pantoprazole  (PROTONIX ) 40 MG tablet Take 1 tablet (40 mg total) by mouth 2 (two) times daily for 30 days, THEN 1 tablet (40 mg total) daily. 09/14/24 12/13/24  Gonfa, Taye T, MD  polyethylene glycol powder (GLYCOLAX /MIRALAX ) powder Take 17 g by mouth daily as needed.  07/15/18   Lonn Hicks, MD  tacrolimus (PROTOPIC) 0.1 % ointment Apply 1 Application topically daily. 05/10/24   [provider]  tamsulosin  (FLOMAX ) 0.4 MG CAPS capsule Take 1 capsule (0.4 mg total) by mouth daily. For kidney stone 09/14/24   Kathrin Mignon DASEN, MD    Allergies: Cephalexin, Clindamycin/lincomycin, Codeine, Levofloxacin, Morphine  and codeine, Other, Quinolones, Sulfa antibiotics, Tramadol , Amoxicillin, Fexofenadine hcl, Floxin [ocuflox], Neurontin [gabapentin], and Septra [bactrim]    Review of Systems  Gastrointestinal:  Positive for vomiting.    Updated Vital Signs BP 134/71   Pulse 85   Temp 97.7 F (36.5 C)   Resp 18   Ht 5' 2 (1.575 m)   Wt 83.5 kg   SpO2 98%   BMI 33.65 kg/m   Physical Exam Vitals and nursing note reviewed.  Constitutional:      General: She is not in acute distress.    Appearance: Normal appearance. She is well-developed. She is not ill-appearing.  HENT:     Head: Normocephalic and atraumatic.     Right Ear: Tympanic membrane, ear canal and external ear normal.     Left Ear: Tympanic membrane, ear canal and external ear normal.     Nose: Nose normal.     Mouth/Throat:     Mouth: Mucous membranes are moist.     Comments: Poor dentition, abscess or wound, no swelling  Eyes:     General: Lids are normal.  Extraocular Movements: Extraocular movements intact.     Conjunctiva/sclera: Conjunctivae normal.     Right eye: Right conjunctiva is not injected.     Left eye: Left conjunctiva is not injected.     Pupils: Pupils are equal, round, and reactive to light.  Cardiovascular:     Rate and Rhythm: Normal rate and regular rhythm.     Heart sounds: Normal heart sounds. No murmur heard. Pulmonary:     Effort: Pulmonary effort is normal. No respiratory distress.     Breath sounds: Normal breath sounds. No decreased breath sounds, wheezing, rhonchi or rales.  Abdominal:     Palpations: Abdomen is soft.     Tenderness: There is  abdominal tenderness (mild).  Musculoskeletal:        General: No swelling.     Cervical back: Normal range of motion and neck supple.  Skin:    General: Skin is warm and dry.     Capillary Refill: Capillary refill takes less than 2 seconds.     Findings: No abscess or rash.  Neurological:     Mental Status: She is alert and oriented to person, place, and time. Mental status is at baseline.     Cranial Nerves: No cranial nerve deficit.     Sensory: Sensation is intact. No sensory deficit.     Motor: Motor function is intact. No weakness.  Psychiatric:        Mood and Affect: Mood normal.     (all labs ordered are listed, but only abnormal results are displayed) Labs Reviewed  COMPREHENSIVE METABOLIC PANEL WITH GFR - Abnormal; Notable for the following components:      Result Value   Glucose, Bld 169 (*)    BUN 26 (*)    Calcium  11.9 (*)    AST 409 (*)    ALT 315 (*)    Alkaline Phosphatase 180 (*)    GFR, Estimated 59 (*)    All other components within normal limits  I-STAT CHEM 8, ED - Abnormal; Notable for the following components:   BUN 28 (*)    Glucose, Bld 170 (*)    Calcium , Ion 1.48 (*)    Hemoglobin 15.6 (*)    All other components within normal limits  LIPASE, BLOOD  CBC  URINALYSIS, ROUTINE W REFLEX MICROSCOPIC  SEDIMENTATION RATE  ACETAMINOPHEN  LEVEL  SALICYLATE LEVEL    EKG: None  Radiology: CT ABDOMEN PELVIS W CONTRAST Result Date: 09/28/2024 EXAM: CT ABDOMEN AND PELVIS WITH CONTRAST 09/28/2024 05:31:34 AM TECHNIQUE: CT of the abdomen and pelvis was performed with the administration of 75 mL of iohexol  (OMNIPAQUE ) 350 MG/ML injection. Multiplanar reformatted images are provided for review. Automated exposure control, iterative reconstruction, and/or weight-based adjustment of the mA/kV was utilized to reduce the radiation dose to as low as reasonably achievable. COMPARISON: 09/12/2024 CLINICAL HISTORY: Abdominal pain, acute, nonlocalized. FINDINGS:  LOWER CHEST: Large hiatal hernia which contains greater than 50% intrathoracic stomach. LIVER: Diffuse fatty infiltration of the liver. GALLBLADDER AND BILE DUCTS: Status post cholecystectomy. Common bile duct dilatation and mild intrahepatic bile duct dilatation is again noted. The proximal common bile duct measures up to 1.2 cm. No stone identified along the course of the CBD. SPLEEN: No acute abnormality. PANCREAS: Diffuse fatty infiltration of the pancreas. No main duct dilatation, inflammation, or mass. ADRENAL GLANDS: No acute abnormality. KIDNEYS, URETERS AND BLADDER: Punctate stone within the upper pole collecting system of the left kidney noted. There has been interval placement of a right-sided  nephroureteral stent which appears to be in a satisfactory position. Interval improvement in previous right hydronephrosis and hydroureter. Stone within the central right renal pelvis is again seen measuring 9 mm, image 38/11. Previous stones along the course of the right ureter have resolved. Multiple approximately 4 small stones are identified within the mid and inferior pole collecting system of the right kidney, measuring up to 5 mm. New stranding about the right ureter and right renal collecting system is nonspecific and may reflect sequelae of instrumentation. Urinary tract infection cannot be excluded. Bladder appears normal. GI AND BOWEL: Interval improvement in wall thickening and edema about the gastric antrum. The small ureteral orifice along the gastric antrum is not confidently identified on the current exam. Small bowel loops are nondilated. The appendix is visualized and appears normal. Moderate colonic stool burden. Sigmoid diverticulosis without signs of acute diverticulitis. PERITONEUM AND RETROPERITONEUM: No significant free fluid or fluid collections. No signs of pneumoperitoneum. VASCULATURE: Aortic atherosclerotic calcifications. LYMPH NODES: No lymphadenopathy. REPRODUCTIVE ORGANS: The  endometrium is increased in thickness for a postmenopausal female, measuring 1.1 cm, axial image 65/11. No adnexal mass identified. BONES AND SOFT TISSUES: Thoracolumbar degenerative disc disease. No acute osseous abnormality. No focal soft tissue abnormality. IMPRESSION: 1. Interval placement of a right-sided nephroureteral stent with improvement in right hydronephrosis and hydroureter. New stranding about the right ureter and right renal collecting system that may reflect sequelae of instrumentation; urinary tract infection cannot be excluded. 2. Right nephrolithiasis, including a persistent 9 mm stone in the central right renal pelvis and multiple additional small right renal stones. Previous right ureteral calculi have resolved. 3. Interval resolution of wall edema involving the gastric antrum. Previously noted the ulcer crater is no longer visualized in this area. Electronically signed by: Waddell Calk MD 09/28/2024 06:19 AM EST RP Workstation: [id number]     Procedures   Medications Ordered in the ED  ondansetron  (ZOFRAN -ODT) disintegrating tablet 4 mg (4 mg Oral Given 09/28/24 0417)  iohexol  (OMNIPAQUE ) 350 MG/ML injection 75 mL (75 mLs Intravenous Contrast Given 09/28/24 0532)    Clinical Course as of 09/28/24 0648  Thu Sep 28, 2024  0633 Headache sudden onset starting last night accompanied with n/v. R sided temple. Waiting on CT angio for r/o of subarachnoid and ESR for GCA. Admit regardless for transaminitis.  [CB]    Clinical Course User Index [CB] Beola Terrall RAMAN, PA-C                                 Medical Decision Making Amount and/or Complexity of Data Reviewed Labs: ordered.  Risk Prescription drug management.     This patient presents to the ED for concern of right-sided temple, eye, jaw, and mouth pain, nausea and vomiting that started a few hours ago. this involves an extensive number of treatment options, and is a complaint that carries with it a high risk of  complications and morbidity.  The differential diagnosis includes dental pain, giant cell arteritis, TIA, acute ischemic stroke, SAH, nephrolithiasis, gastritis.      Additional history obtained:  Additional history obtained from EMR External records from outside source obtained and reviewed including cystoscopy from 09/20/2024.    Lab Tests:  I Ordered, and personally interpreted labs.  The pertinent results include: Elevated LFTs.  Pending urinalysis, salicylate, acetaminophen    Imaging Studies ordered:  I ordered imaging studies including CT abdomen pelvis with contrast and CT angio head neck  with and without contrast Pending results   Cardiac Monitoring: / EKG:  The patient was maintained on a cardiac monitor.  I personally viewed and interpreted the cardiac monitored which showed an underlying rhythm of: Normal sinus   Problem List / ED Course / Critical interventions / Medication management  Patient presents to the ER with right-sided temple, and eye pain that radiates to the right side of mouth.  Patient is not in distress well-appearing on exam.  Vital signs are stable at this time.  Benign physical exam. increased LFTs on workup.  Pending ESR and CT scans.    Case seen in conjunction with Dr. Trine and Lamar Schlossman PA-C.     Accepted handoff at shift change from Collin, Bauer PA-C. Please see prior provider note for more detail.   Briefly: Patient is 89 y.o. presents today with right-sided temple and eye pain along with nausea and vomiting that started a few hours ago. Patient is not in distress and not ill-appearing.  No focal deficit neurovascularly intact. vital signs stable. Obtained CT head to evaluate for bleeding, new stroke.  Elevated LFTs and obtained CT abdomen pelvis for further evaluation. Obtained ESR to rule out GCA   DDX: concern for GCA, elevated LFTs, referred pain from poor dentition and recent tooth extraction   Plan: pending ESR and CT head,  if negative, will most likely admit.due to elevated LFTs            Final diagnoses:  None    ED Discharge Orders     None          Braxton Dubois, PA-C 09/28/24 9350    Trine Raynell Moder, MD 09/28/24 [phone]

## 2024-09-28 NOTE — ED Notes (Signed)
 Patient unable urinate at this time. Patient refuses cath related to recent procedure and told by provider no tubes to be inserted. Patient states she can urinate but usually burns but will be able and let us  know.

## 2024-09-29 ENCOUNTER — Observation Stay (HOSPITAL_COMMUNITY)

## 2024-09-29 DIAGNOSIS — N3000 Acute cystitis without hematuria: Secondary | ICD-10-CM | POA: Diagnosis not present

## 2024-09-29 DIAGNOSIS — R1110 Vomiting, unspecified: Secondary | ICD-10-CM | POA: Diagnosis not present

## 2024-09-29 DIAGNOSIS — K838 Other specified diseases of biliary tract: Secondary | ICD-10-CM

## 2024-09-29 DIAGNOSIS — R7401 Elevation of levels of liver transaminase levels: Secondary | ICD-10-CM

## 2024-09-29 LAB — URINE CULTURE: Culture: NO GROWTH

## 2024-09-29 LAB — BASIC METABOLIC PANEL WITH GFR
Anion gap: 8 (ref 5–15)
BUN: 21 mg/dL (ref 8–23)
CO2: 25 mmol/L (ref 22–32)
Calcium: 11.4 mg/dL — ABNORMAL HIGH (ref 8.9–10.3)
Chloride: 106 mmol/L (ref 98–111)
Creatinine, Ser: 0.91 mg/dL (ref 0.44–1.00)
GFR, Estimated: >60 mL/min
Glucose, Bld: 115 mg/dL — ABNORMAL HIGH (ref 70–99)
Potassium: 4 mmol/L (ref 3.5–5.1)
Sodium: 139 mmol/L (ref 135–145)

## 2024-09-29 LAB — HEPATIC FUNCTION PANEL
ALT: 154 U/L — ABNORMAL HIGH (ref 0–44)
AST: 101 U/L — ABNORMAL HIGH (ref 15–41)
Albumin: 3.4 g/dL — ABNORMAL LOW (ref 3.5–5.0)
Alkaline Phosphatase: 133 U/L — ABNORMAL HIGH (ref 38–126)
Bilirubin, Direct: 0.2 mg/dL (ref 0.0–0.2)
Indirect Bilirubin: 0.3 mg/dL (ref 0.3–0.9)
Total Bilirubin: 0.4 mg/dL (ref 0.0–1.2)
Total Protein: 5.7 g/dL — ABNORMAL LOW (ref 6.5–8.1)

## 2024-09-29 LAB — CBC
HCT: 37.4 % (ref 36.0–46.0)
Hemoglobin: 12.4 g/dL (ref 12.0–15.0)
MCH: 29.2 pg (ref 26.0–34.0)
MCHC: 33.2 g/dL (ref 30.0–36.0)
MCV: 88 fL (ref 80.0–100.0)
Platelets: 267 K/uL (ref 150–400)
RBC: 4.25 MIL/uL (ref 3.87–5.11)
RDW: 13.3 % (ref 11.5–15.5)
WBC: 4.9 K/uL (ref 4.0–10.5)
nRBC: 0 % (ref 0.0–0.2)

## 2024-09-29 MED ORDER — PHENAZOPYRIDINE HCL 100 MG PO TABS
100.0000 mg | ORAL_TABLET | Freq: Once | ORAL | Status: AC
  Administered 2024-09-29: 100 mg via ORAL
  Filled 2024-09-29: qty 1

## 2024-09-29 MED ORDER — GADOBUTROL 1 MMOL/ML IV SOLN
8.0000 mL/kg | Freq: Once | INTRAVENOUS | Status: AC | PRN
  Administered 2024-09-29: 8.3 mL via INTRAVENOUS

## 2024-09-29 NOTE — Progress Notes (Signed)
 Patient ID: Melanie [name], female   DOB: [birth year], 89 y.o.   MRN: [id number]   Brief GI note;  Seen in consultation yesterday for elevated LFTs, patient asymptomatic from GI perspective and felt most likely to have Dili-related to recent antibiotic use/doxycycline   CT abdomen and pelvis with contrast on admission status postcholecystectomy, common bile duct dilated and mild intrahepatic bile duct dilation again noted proximal CBD measuring up to 1.2 cm -no stone identified  Labs today T. bili 0.4/alk phos 133/AST 101/ALT 154 improved WBC 4.9/Hb 12.4/hematocrit 37.4/platelets 267  Pelvic ultrasound yesterday showed heterogeneous thickened endometrium up to 13 mm without associated vascularity, GYN evaluation recommended, also probable complicated 12 mm cervical lesion/nabothian cyst   Previous GI imaging with CT abdomen and pelvis in 2022 had shown absent gallbladder with and mild enlargement of the extrahepatic biliary tree with CBD of 8 mm   Plan; LFTs are trending down, suspect this was drug-induced/doxycycline  Continue to trend LFTs  MRI/MRCP has been ordered for evaluation of the dilated common bile duct which has been chronic but has increased in size over the past couple of years.  Dr. Rollin is on-call for GI this weekend, will follow-up post MRI/MRCP

## 2024-09-29 NOTE — Hospital Course (Addendum)
 89 y.o. F with obesity, gastric ulcer, HTN and recent complicated UTI with ureteral stone, stented on 12/10 by Dr. [name] who presented with dysuria, vomiting, headache.  CTA head and neck in the ER was unremarkable.  CT abdomen showed stranding around the kidney, admitted on IV antibiotics.  Incidentally found to have abnormal liver enzymes.     Ureteral stone UTI ruled out, urine culture from yesterday no growth.  Antibiotics stopped. - Follow up with Urology - Continue analgesics and Flomax    Transaminitis, likely doxycycline -associated DILI GI consulted.  Hepatitis serologies negative.  LFTs improving.  GI suspect DILI from doxycyline.  Obtaining MRCP to rule out stone.  Headache Vomiting resolved in the ER, tolerating diet today.  CTA head showed no abnormality, no hemorrhage.  Class 1 obesity  Hypertension BP normal off meds  Endometrial thickening Known problem.   - Follow up with OB-Gyn

## 2024-09-29 NOTE — Plan of Care (Signed)

## 2024-09-29 NOTE — Plan of Care (Signed)
   Problem: Education: Goal: Knowledge of General Education information will improve Description Including pain rating scale, medication(s)/side effects and non-pharmacologic comfort measures Outcome: Progressing   Problem: Health Behavior/Discharge Planning: Goal: Ability to manage health-related needs will improve Outcome: Progressing

## 2024-09-29 NOTE — Discharge Summary (Signed)
 " Physician Discharge Summary   Patient: Melanie [name] MRN: [id number] DOB: 1935/02/15  Admit date:     09/28/2024  Discharge date: 09/29/2024  Discharge Physician: Lonni SHAUNNA Dalton   PCP: Austin Mutton, MD     Recommendations at discharge:  Follow up with Urology Dr. Selma for nephrolithiasis in 2 weeks Follow up with GI Dr. Albertus for transaminitis suspected DILI from doxycycline  Dr. Albertus: Please repeat LFTs in 1 week      Discharge Diagnoses: Principal Problem:   Vomiting Active Problems:   Transaminitis, suspected DILI from doxycycline    Dilated bile ducts   Class 1 obesity   Ureterolithiasis   UTI ruled out   Headache      Hospital Course: 89 y.o. F with obesity, gastric ulcer, HTN and recent complicated UTI with ureteral stone, stented on 12/10 by Dr. [name] who presented with dysuria, vomiting, headache.  CTA head and neck in the ER was unremarkable.  CT abdomen showed stranding around the kidney, admitted on IV antibiotics.  Incidentally found to have abnormal liver enzymes.     Ureteral stone Urine culture collected on admission prior to antibiotics.  No growth.  Antibiotics stopped.    - Follow up with Urology closely - Continue analgesics and Flomax    Transaminitis, likely doxycycline -associated DILI GI consulted.  Hepatitis serologies negative.  LFTs improving.  GI suspect DILI from doxycyline.  Given slowly progressive CBD dilation, GI recommended nonurgent MRCP.  Pending at discharge. - Repeat LFTs in 5-7 days  Headache Vomiting resolved in the ER, tolerating diet today.  CTA head showed no abnormality, no hemorrhage.  Class 1 obesity  Endometrial thickening Known problem.   - Follow up with OB-Gyn           The [place]  Controlled Substances Registry was reviewed for this patient prior to discharge.  Consultants: GI  Procedures performed: CTA head  US  abdomen TVUS MRCP   Disposition: Home Diet recommendation:   Regular diet  DISCHARGE MEDICATION: Allergies as of 09/29/2024       Reactions   Cephalexin Other (See Comments)   Affected pt's child -  Therefore , pt does not take Keflex.   Clindamycin/lincomycin    Codeine Nausea And Vomiting   Doxycycline     Suspected DILI liver injury 09/28/2024   Levofloxacin Other (See Comments)   Hallucinations.   Morphine  And Codeine Nausea And Vomiting   Other    Another antibiotic that starts with cina?   Quinolones Other (See Comments)   Hallucinations.   Sulfa Antibiotics Nausea And Vomiting   Tramadol  Nausea And Vomiting   Amoxicillin Nausea Only   Fexofenadine Hcl Other (See Comments)   unknown reaction   Floxin [ocuflox] Other (See Comments)   unknown allergy   Neurontin [gabapentin] Palpitations   Septra [bactrim] Other (See Comments)   Unknown reaction        Medication List     STOP taking these medications    cefdinir 300 MG capsule Commonly known as: OMNICEF   doxycycline  100 MG tablet Commonly known as: VIBRA -TABS       TAKE these medications    estradiol 0.1 MG/GM vaginal cream Commonly known as: ESTRACE Place 1 Applicatorful vaginally.   folic acid  1 MG tablet Commonly known as: FOLVITE  TAKE 1 TABLET BY MOUTH EVERY DAY   IVIZIA DRY EYES OP Place 1 drop into both eyes 3 (three) times daily.   metoprolol  tartrate 50 MG tablet Commonly known as: LOPRESSOR  Take 1 tablet (50  mg total) by mouth 2 (two) times daily.   ondansetron  4 MG disintegrating tablet Commonly known as: ZOFRAN -ODT Take 4 mg by mouth every 8 (eight) hours as needed for nausea or vomiting.   pantoprazole  40 MG tablet Commonly known as: Protonix  Take 1 tablet (40 mg total) by mouth 2 (two) times daily for 30 days, THEN 1 tablet (40 mg total) daily. Start taking on: September 14, 2024   phenazopyridine 100 MG tablet Commonly known as: PYRIDIUM Take 100 mg by mouth 3 (three) times daily as needed for pain.   polyethylene glycol powder 17  GM/SCOOP powder Commonly known as: GLYCOLAX /MIRALAX  Take 17 g by mouth daily as needed. What changed: when to take this   tacrolimus 0.1 % ointment Commonly known as: PROTOPIC Apply 1 Application topically daily as needed (Eyelid/Ears).   tamsulosin  0.4 MG Caps capsule Commonly known as: Flomax  Take 1 capsule (0.4 mg total) by mouth daily. For kidney stone   traMADol  50 MG tablet Commonly known as: ULTRAM  Take 25 mg by mouth every 4 (four) hours as needed for moderate pain (pain score 4-6).   Vitamin B12 500 MCG Tabs Take 500 mcg by mouth daily.        Follow-up Information     Austin Mutton, MD Follow up.   Specialty: Internal Medicine Contact information: [address] [place] [address] [phone]         Albertus Gordy HERO, MD Follow up.   Specialty: Gastroenterology Why: Have liver tests rechecked in 1 week Contact information: [address] [place] [address] [phone]         Selma Donnice SAUNDERS, MD Follow up.   Specialty: Urology Contact information: [address] [place] [address] [phone]                 Discharge Instructions     Discharge instructions   Complete by: As directed    **IMPORTANT DISCHARGE INSTRUCTIONS**   From Dr. Jonel: You were admitted for headache, vomiting and burning with urination.  We also found, by accident, that your liver enzymes on your blood work were abnormal  You had imaging of your head that ruled out bleeding, tumors, or anything concerning. Thankfully, the headache and vomiting improved  The liver tests also improved  Dr. Albertus believes that this was liver injury as a drug reaction from DOXYCYCLINE  I've added this to your allergy list Dr. Albertus did want an MRI of the liver to monitor the bile ducts, which are slowly enlarging. We will follow this up tomorrow Have your liver tests repeated within 1 week, at Dr. Albertus or Dr. Abram office, whichever is better   For the  bladder: Your urine culture here showed no bacteria, no infection Good Stop the antibiotics cefdinir and doxycycline  For now, don't take any antibiotics You may take pyridium if you have some left, but generally this is recommended not to take longer than a few days  For pain, take acetaminophen  1000 mg up to three times daily ALSO, take tramadol  25 mg up to every 6 hours If tramadol  25 mg doesn't work, and you're not having symptoms, you can actually double the dose, but do this cautiously and watch for side effects (sleepiness, too much sedation)  Keep the planned follow up with the Gynecologist after your stone is removed in 2 weeks   Increase activity slowly   Complete by: As directed        Discharge Exam: [employer]  09/28/24 0408  Weight: 83.5 kg    General: Pt is alert, awake, not in acute distress Cardiovascular: RRR, nl S1-S2, no murmurs appreciated.   No LE edema.   Respiratory: Normal respiratory rate and rhythm.  CTAB without rales or wheezes. Abdominal: Abdomen soft and non-tender.  No distension or HSM.   Neuro/Psych: Strength symmetric in upper and lower extremities.  Judgment and insight appear normal.   Condition at discharge: good  The results of significant diagnostics from this hospitalization (including imaging, microbiology, ancillary and laboratory) are listed below for reference.   Imaging Studies: US  PELVIC COMPLETE WITH TRANSVAGINAL Result Date: 09/28/2024 EXAM: US  Pelvis, Complete Transvaginal and Transabdominal without Doppler TECHNIQUE: Transabdominal and transvaginal pelvic duplex ultrasound using B-mode/gray scaled imaging was obtained. COMPARISON: CT abdomen and pelvis 09/28/2024 CLINICAL HISTORY: Thickened endometrium FINDINGS: UTERUS: Uterus measures 8.7 x 3.2 x 5.1 cm. Uterus demonstrates normal myometrial echotexture. ENDOMETRIAL STRIPE: The endometrium is heterogeneous and thickened measuring up to 13 mm. There is no endometrial  vascularity identified. RIGHT OVARY: Right ovary measures 2.9 x 2.2 x 1.9 cm. Right ovary is within normal limits. There is normal arterial and venous Doppler flow. LEFT OVARY: The left ovary is not visualized. FREE FLUID: No pelvic free fluid identified. CERVIX: Within the cervical region there is a complex hypoechoic structure measuring 12 mm, possibly a complex nabothian cyst, but other etiologies are not excluded. IMPRESSION: 1. Heterogeneous thickened endometrium measuring up to 13 mm without associated vascularity, abnormal in a postmenopausal patien. Gynecologic evaluation with consideration for endometrial sampling is recommended. 2. Complex 12 mm hypoechoic cervical lesion, most consistent with a complicated nabothian cyst. Gynecologic evaluation recommended to exclude other etiologies. Electronically signed by: Greig Pique MD 09/28/2024 04:55 PM EST RP Workstation: [id number]   CT ANGIO HEAD NECK W WO CM Result Date: 09/28/2024 EXAM: CTA Head and Neck with and without Intravenous Contrast. CT Head without Contrast. CLINICAL HISTORY: sudden onset headache, awakened from sleep TECHNIQUE: Axial CTA images of the head and neck performed with and without intravenous contrast. MIP reconstructed images were created and reviewed. Axial computed tomography images of the head/brain performed without intravenous contrast. Note: Per PQRS, the description of internal carotid artery percent stenosis, including 0 percent or normal exam, is based on [employer] Symptomatic Carotid Endarterectomy Trial (NASCET) criteria. Dose reduction technique was used including one or more of the following: automated exposure control, adjustment of mA and kV according to patient size, and/or iterative reconstruction. CONTRAST: With and without; 75 mL (iohexol  (OMNIPAQUE ) 350 MG/ML injection 75 mL IOHEXOL  350 MG/ML SOLN) COMPARISON: None provided. FINDINGS: CT HEAD: BRAIN: There is age-related atrophy and mild periventricular  white matter disease. No acute intraparenchymal hemorrhage. No mass lesion. No CT evidence for acute territorial infarct. No midline shift or extra-axial collection. VENTRICLES: No hydrocephalus. ORBITS: The orbits are unremarkable. SINUSES AND MASTOIDS: The paranasal sinuses and mastoid air cells are clear. CTA NECK: COMMON CAROTID ARTERIES: No significant stenosis. No dissection or occlusion. INTERNAL CAROTID ARTERIES: There is mild calcific and noncalcific plaque within the origin of the left internal carotid artery with less than 20% luminal stenosis. No dissection or occlusion. VERTEBRAL ARTERIES: No significant stenosis. No dissection or occlusion. CTA HEAD: ANTERIOR CEREBRAL ARTERIES: No significant stenosis. No occlusion. No aneurysm. MIDDLE CEREBRAL ARTERIES: No significant stenosis. No occlusion. No aneurysm. POSTERIOR CEREBRAL ARTERIES: No significant stenosis. No occlusion. No aneurysm. BASILAR ARTERY: No significant stenosis. No occlusion. No aneurysm. OTHER: There are moderate calcifications within the carotid siphons. There is  calcific plaque within the carotid siphons bilaterally, with less than 20% stenosis bilaterally. SOFT TISSUES: No acute finding. No masses or lymphadenopathy. BONES: No acute osseous abnormality. IMPRESSION: 1. No acute intracranial hemorrhage or ischemic change. Mild age-related atrophy and chronic small vessel ischemic change. 2. Mild calcific and noncalcific plaque within the origin of the left internal carotid artery with less than 20% luminal stenosis. 3. Calcific plaque within the carotid siphons bilaterally, with less than 20% stenosis bilaterally. Electronically signed by: Evalene Coho MD 09/28/2024 08:17 AM EST RP Workstation: [id number]   CT ABDOMEN PELVIS W CONTRAST Addendum Date: 09/28/2024 ** ADDENDUM #1 ** ADDENDUM: The endometrium is increased in thickness for a postmenopausal female, measuring 1.1 cm, axial image 65/11. Recommend further evaluation with  pelvic sonogram. ---------------------------------------------------- Electronically signed by: Waddell Calk MD 09/28/2024 07:57 AM EST RP Workstation: [id number]   Result Date: 09/28/2024 ** ORIGINAL REPORT ** EXAM: CT ABDOMEN AND PELVIS WITH CONTRAST 09/28/2024 05:31:34 AM TECHNIQUE: CT of the abdomen and pelvis was performed with the administration of 75 mL of iohexol  (OMNIPAQUE ) 350 MG/ML injection. Multiplanar reformatted images are provided for review. Automated exposure control, iterative reconstruction, and/or weight-based adjustment of the mA/kV was utilized to reduce the radiation dose to as low as reasonably achievable. COMPARISON: 09/12/2024 CLINICAL HISTORY: Abdominal pain, acute, nonlocalized. FINDINGS: LOWER CHEST: Large hiatal hernia which contains greater than 50% intrathoracic stomach. LIVER: Diffuse fatty infiltration of the liver. GALLBLADDER AND BILE DUCTS: Status post cholecystectomy. Common bile duct dilatation and mild intrahepatic bile duct dilatation is again noted. The proximal common bile duct measures up to 1.2 cm. No stone identified along the course of the CBD. SPLEEN: No acute abnormality. PANCREAS: Diffuse fatty infiltration of the pancreas. No main duct dilatation, inflammation, or mass. ADRENAL GLANDS: No acute abnormality. KIDNEYS, URETERS AND BLADDER: Punctate stone within the upper pole collecting system of the left kidney noted. There has been interval placement of a right-sided nephroureteral stent which appears to be in a satisfactory position. Interval improvement in previous right hydronephrosis and hydroureter. Stone within the central right renal pelvis is again seen measuring 9 mm, image 38/11. Previous stones along the course of the right ureter have resolved. Multiple approximately 4 small stones are identified within the mid and inferior pole collecting system of the right kidney, measuring up to 5 mm. New stranding about the right ureter and right renal  collecting system is nonspecific and may reflect sequelae of instrumentation. Urinary tract infection cannot be excluded. Bladder appears normal. GI AND BOWEL: Interval improvement in wall thickening and edema about the gastric antrum. The small ureteral orifice along the gastric antrum is not confidently identified on the current exam. Small bowel loops are nondilated. The appendix is visualized and appears normal. Moderate colonic stool burden. Sigmoid diverticulosis without signs of acute diverticulitis. PERITONEUM AND RETROPERITONEUM: No significant free fluid or fluid collections. No signs of pneumoperitoneum. VASCULATURE: Aortic atherosclerotic calcifications. LYMPH NODES: No lymphadenopathy. REPRODUCTIVE ORGANS: The endometrium is increased in thickness for a postmenopausal female, measuring 1.1 cm, axial image 65/11. No adnexal mass identified. BONES AND SOFT TISSUES: Thoracolumbar degenerative disc disease. No acute osseous abnormality. No focal soft tissue abnormality. IMPRESSION: 1. Interval placement of a right-sided nephroureteral stent with improvement in right hydronephrosis and hydroureter. New stranding about the right ureter and right renal collecting system that may reflect sequelae of instrumentation; urinary tract infection cannot be excluded. 2. Right nephrolithiasis, including a persistent 9 mm stone in the central right renal pelvis and multiple additional small  right renal stones. Previous right ureteral calculi have resolved. 3. Interval resolution of wall edema involving the gastric antrum. Previously noted the ulcer crater is no longer visualized in this area. Electronically signed by: Waddell Calk MD 09/28/2024 06:19 AM EST RP Workstation: [id number]   DG C-Arm 1-60 Min-No Report Result Date: 09/20/2024 Fluoroscopy was utilized by the requesting physician.  No radiographic interpretation.   CT ABDOMEN PELVIS W CONTRAST Result Date: 09/12/2024 CLINICAL DATA:  Diffuse abdominal  pain. EXAM: CT ABDOMEN AND PELVIS WITH CONTRAST TECHNIQUE: Multidetector CT imaging of the abdomen and pelvis was performed using the standard protocol following bolus administration of intravenous contrast. RADIATION DOSE REDUCTION: This exam was performed according to the departmental dose-optimization program which includes automated exposure control, adjustment of the mA and/or kV according to patient size and/or use of iterative reconstruction technique. CONTRAST:  75mL OMNIPAQUE  IOHEXOL  350 MG/ML SOLN COMPARISON:  12/26/2020 FINDINGS: Lower chest: Stable borderline cardiomegaly. Stable large hiatal hernia. Minimal calcified plaque over the descending thoracic aorta. Visualized lung bases demonstrate a tiny amount of left pleural fluid. Hepatobiliary: Previous cholecystectomy. Liver and biliary tree are unremarkable. Pancreas: Normal. Spleen: Normal. Adrenals/Urinary Tract: Adrenal glands are normal. Kidneys are normal size. Possible punctate nonobstructing stone over the upper pole left kidney. Possible punctate stone over the lower pole right kidney. There is mild dilatation of the right intrarenal collecting system and renal pelvis as there is a 1 cm stone over the right renal pelvis causing this low-grade obstruction. There is an adjacent punctate stone also over the right renal pelvis as well as 1-2 mm stone over the proximal right ureter. 4-5 mm over the right mid ureter. Additional 2 mm stone over the mid to distal right ureter. Left ureter and bladder are normal. Stomach/Bowel: Large hiatal hernia unchanged as described above. Possible ulcer of the gastric antrum with ill definition of the adjacent gastric wall. Small bowel is unremarkable. Appendix is normal. Mild diverticulosis of the descending and sigmoid colon without active inflammation. Vascular/Lymphatic: Calcified plaque over the abdominal aorta which is otherwise normal in caliber. Remaining vascular structures are unremarkable. No adenopathy.  Reproductive: Uterus and bilateral adnexa are unremarkable. Other: No free fluid or focal inflammatory change. Single surgical clip over the anterior left mid abdomen. Musculoskeletal: No acute abnormality. IMPRESSION: 1. Mild dilatation of the right intrarenal collecting system and renal pelvis as there is a 1 cm stone over the right renal pelvis. Additional 1-2 mm stone over the proximal right ureter and 4-5 mm stone over the right mid ureter as the stones likely account for patient's low-grade obstruction. 2 mm stone projects in the region of the mid to distal right ureter which may be an additional ureteral stone versus phlebolith. Possible punctate nonobstructing stone over the upper pole left kidney and lower pole right kidney. 2. Possible ulcer of the gastric antrum with ill definition of the adjacent gastric wall. Recommend correlation with endoscopy. 3. Stable large hiatal hernia. 4. Mild colonic diverticulosis without active inflammation. 5. Aortic atherosclerosis. Aortic Atherosclerosis (ICD10-I70.0). Electronically Signed   By: Toribio Agreste M.D.   On: 09/12/2024 11:07    Microbiology: Results for orders placed or performed during the hospital encounter of 09/28/24  Urine Culture     Status: None   Collection Time: 09/28/24  7:09 AM   Specimen: Urine, Clean Catch  Result Value Ref Range Status   Specimen Description URINE, CLEAN CATCH  Final   Special Requests NONE  Final   Culture   Final  NO GROWTH Performed at [facility], [address] [place], [place] [address]    Report Status 09/29/2024 FINAL  Final    Labs: CBC: Recent Labs  Lab 09/28/24 0419 09/28/24 0514 09/28/24 1056 09/29/24 0626  WBC 7.1  --  7.4 4.9  HGB 14.7 15.6* 13.6 12.4  HCT 45.3 46.0 42.3 37.4  MCV 89.3  --  89.6 88.0  PLT 311  --  323 267   Basic Metabolic Panel: Recent Labs  Lab 09/28/24 0419 09/28/24 0514 09/28/24 1056 09/29/24 0626  NA 137 138  --  139  K 4.3 4.3  --  4.0  CL 101  101  --  106  CO2 27  --   --  25  GLUCOSE 169* 170*  --  115*  BUN 26* 28*  --  21  CREATININE 0.93 1.00 0.86 0.91  CALCIUM  11.9*  --   --  11.4*   Liver Function Tests: Recent Labs  Lab 09/28/24 0419 09/29/24 0626  AST 409* 101*  ALT 315* 154*  ALKPHOS 180* 133*  BILITOT 0.8 0.4  PROT 6.8 5.7*  ALBUMIN 4.0 3.4*   CBG: No results for input(s): GLUCAP in the last 168 hours.  Discharge time spent: approximately 45 minutes spent on discharge counseling, evaluation of patient on day of discharge, and coordination of discharge planning with nursing, social work, pharmacy and case management  Signed: Lonni SHAUNNA Dalton, MD [facility] 09/29/2024         "

## 2024-09-29 NOTE — Care Management Obs Status (Signed)
 MEDICARE OBSERVATION STATUS NOTIFICATION   Patient Details  Name: Melanie [name] MRN: [id number] Date of Birth: 09/01/1935   Medicare Observation Status Notification Given:  Yes  Spoke with the patients daughter by phone advised that a copy of this notice will be left in the patients room  Dollene Mallery 09/29/2024, 10:57 AM

## 2024-10-03 ENCOUNTER — Other Ambulatory Visit: Payer: Self-pay

## 2024-10-03 ENCOUNTER — Telehealth: Payer: Self-pay

## 2024-10-03 DIAGNOSIS — R7989 Other specified abnormal findings of blood chemistry: Secondary | ICD-10-CM

## 2024-10-03 NOTE — Telephone Encounter (Signed)
 Pyrtle, Gordy HERO, MD  Jonel Lonni SQUIBB, MD; Marcello Alan CROME, CMA Alan, Please let patient know that her MRI/MRCP showed her bile ducts are normal and there is no sign of inflammation or bile duct stone She should come next week for hepatic function panel to follow-up elevated liver enzymes felt secondary to a drug-induced liver injury (most likely doxycycline ) Thank you JMP  [place], FYI       Previous Messages    ----- Message ----- From: Jonel Lonni SQUIBB, MD Sent: 09/29/2024   6:31 PM EST To: Gordy HERO Starch, MD Subject: Inpatient Notes                                Hey Dr. Starch, This patient found out she was in observation status late this aftenroon when she came back from MRCP and she and daughter wanted to leave, so I did let them go.  I will follow up the MRCP results and will call them with it.  Can they come to [facility] for follow up LFTs? -[place] Jonel [phone]

## 2024-10-03 NOTE — Telephone Encounter (Signed)
 Spoke with patient and informed her of her MRI/MRCP results. Informed patient there is no sign of inflammation or a bile duct stone. Informed patient she needs to come by our office next week to have repeat labs (hepatic function panel). Patient verbalized understanding and will come by next week for labs. Orders placed.

## 2024-10-09 ENCOUNTER — Other Ambulatory Visit (HOSPITAL_COMMUNITY): Payer: Self-pay | Admitting: Physician Assistant

## 2024-10-09 ENCOUNTER — Ambulatory Visit: Payer: Self-pay | Admitting: Internal Medicine

## 2024-10-09 DIAGNOSIS — K838 Other specified diseases of biliary tract: Secondary | ICD-10-CM

## 2024-10-09 NOTE — Patient Instructions (Signed)
 SURGICAL WAITING ROOM VISITATION Patients having surgery or a procedure may have no more than 2 support people in the waiting area - these visitors may rotate.    Children under the age of 69 will not be allowed to visit due to the increase in respiratory illness  Children under the age of 58 must have an adult with them who is not the patient.  If the patient needs to stay at the hospital during part of their recovery, the visitor guidelines for inpatient rooms apply. Pre-op nurse will coordinate an appropriate time for 1 support person to accompany patient in pre-op.  This support person may not rotate.    Please refer to the [facility] website for the visitor guidelines for Inpatients (after your surgery is over and you are in a regular room).       Your procedure is scheduled on: 10-13-24   Report to [facility] Main Entrance    Report to admitting at 11:30 AM   Call this number if you have problems the morning of surgery [phone]   Do not eat food or drink liquids :After Midnight.          If you have questions, please contact your surgeons office.   FOLLOW  ANY ADDITIONAL PRE OP INSTRUCTIONS YOU RECEIVED FROM YOUR SURGEON'S OFFICE!!!     Oral Hygiene is also important to reduce your risk of infection.                                    Remember - BRUSH YOUR TEETH THE MORNING OF SURGERY WITH YOUR REGULAR TOOTHPASTE   Do NOT smoke after Midnight   Take these medicines the morning of surgery with A SIP OF WATER :    Metoprolol    Pantoprazole    Okay to use eyedrops   If needed Ondansetron , Pyridium  Tramadol   Stop all vitamins and herbal supplements 7 days before surgery  Bring CPAP mask and tubing day of surgery.                              You may not have any metal on your body including hair pins, jewelry, and body piercing             Do not wear make-up, lotions, powders, perfumes or deodorant  Do not wear nail polish including gel and S&S,  artificial/acrylic nails, or any other type of covering on natural nails including finger and toenails. If you have artificial nails, gel coating, etc. that needs to be removed by a nail salon please have this removed prior to surgery or surgery may need to be canceled/ delayed if the surgeon/ anesthesia feels like they are unable to be safely monitored.   Do not shave  48 hours prior to surgery.    Do not bring valuables to the hospital. [facility] IS NOT RESPONSIBLE   FOR VALUABLES.   Contacts, dentures or bridgework may not be worn into surgery.  DO NOT BRING YOUR HOME MEDICATIONS TO THE HOSPITAL. PHARMACY WILL DISPENSE MEDICATIONS LISTED ON YOUR MEDICATION LIST TO YOU DURING YOUR ADMISSION IN THE HOSPITAL!    Patients discharged on the day of surgery will not be allowed to drive home.  Someone NEEDS to stay with you for the first 24 hours after anesthesia.   Special Instructions: Bring a copy of your healthcare power of  attorney and living will documents the day of surgery if you haven't scanned them before.              Please read over the following fact sheets you were given: IF YOU HAVE QUESTIONS ABOUT YOUR PRE-OP INSTRUCTIONS PLEASE CALL [phone] G[facility]  If you received a COVID test during your pre-op visit  it is requested that you wear a mask when out in public, stay away from anyone that may not be feeling well and notify your surgeon if you develop symptoms. If you test positive for Covid or have been in contact with anyone that has tested positive in the last 10 days please notify you surgeon.  [facility] - Preparing for Surgery Before surgery, you can play an important role.  Because skin is not sterile, your skin needs to be as free of germs as possible.  You can reduce the number of germs on your skin by washing with CHG (chlorahexidine gluconate) soap before surgery.  CHG is an antiseptic cleaner which kills germs and bonds with the skin to continue killing germs even after  washing. Please DO NOT use if you have an allergy to CHG or antibacterial soaps.  If your skin becomes reddened/irritated stop using the CHG and inform your nurse when you arrive at Short Stay. Do not shave (including legs and underarms) for at least 48 hours prior to the first CHG shower.  You may shave your face/neck.  Please follow these instructions carefully:  1.  Shower with CHG Soap the night before surgery and the  morning of surgery.  2.  If you choose to wash your hair, wash your hair first as usual with your normal  shampoo.  3.  After you shampoo, rinse your hair and body thoroughly to remove the shampoo.                             4.  Use CHG as you would any other liquid soap.  You can apply chg directly to the skin and wash.  Gently with a scrungie or clean washcloth.  5.  Apply the CHG Soap to your body ONLY FROM THE NECK DOWN.   Do   not use on face/ open                           Wound or open sores. Avoid contact with eyes, ears mouth and   genitals (private parts).                       Wash face,  Genitals (private parts) with your normal soap.             6.  Wash thoroughly, paying special attention to the area where your    surgery  will be performed.  7.  Thoroughly rinse your body with warm water  from the neck down.  8.  DO NOT shower/wash with your normal soap after using and rinsing off the CHG Soap.                9.  Pat yourself dry with a clean towel.            10.  Wear clean pajamas.            11.  Place clean sheets on your bed the night of your first shower and do not  sleep with pets. Day of Surgery : Do not apply any lotions/deodorants the morning of surgery.  Please wear clean clothes to the hospital/surgery center.  FAILURE TO FOLLOW THESE INSTRUCTIONS MAY RESULT IN THE CANCELLATION OF YOUR SURGERY  PATIENT SIGNATURE_________________________________  NURSE  SIGNATURE__________________________________  ________________________________________________________________________

## 2024-10-10 NOTE — Progress Notes (Signed)
 Date of COVID positive in last 90 days:  PCP - Talitha Repress, MD Cardiologist -   Chest x-ray - N/A EKG - 09-13-24 Epic Stress Test - N/A ECHO - N/A Cardiac Cath - N/A Pacemaker/ICD device last checked:N/A Spinal Cord Stimulator:N/A  Bowel Prep - N/A  Sleep Study - N/A CPAP -   Fasting Blood Sugar - N/A Checks Blood Sugar _____ times a day  Last dose of GLP1 agonist-  N/A GLP1 instructions:  Do not take after     Last dose of SGLT-2 inhibitors-  N/A SGLT-2 instructions:  Do not take after    Blood Thinner Instructions: N/A Last dose:   Time: Aspirin Instructions:N/A Last Dose:  Activity level:  For safety reasons patient uses a chair lift.  Able to perform activities of daily living without stopping and without symptoms of chest pain or shortness of breath.  Anesthesia review: Elevated liver enzymes on recent labs.  Having repeat labs 10-11-24 at GI physician   Patient denies shortness of breath, fever, cough and chest pain at PAT appointment (completed over the phone)  Patient verbalized understanding of instructions that were given to them at the PAT appointment. Patient was also instructed that they will need to review over the PAT instructions again at home before surgery.

## 2024-10-11 ENCOUNTER — Encounter (HOSPITAL_COMMUNITY): Payer: Self-pay

## 2024-10-11 ENCOUNTER — Ambulatory Visit: Payer: Self-pay | Admitting: Internal Medicine

## 2024-10-11 ENCOUNTER — Other Ambulatory Visit (INDEPENDENT_AMBULATORY_CARE_PROVIDER_SITE_OTHER)

## 2024-10-11 ENCOUNTER — Other Ambulatory Visit: Payer: Self-pay

## 2024-10-11 ENCOUNTER — Encounter (HOSPITAL_COMMUNITY)
Admission: RE | Admit: 2024-10-11 | Discharge: 2024-10-11 | Disposition: A | Discharge status: Home / Self Care | Source: Ambulatory Visit | Attending: Urology | Admitting: Urology

## 2024-10-11 DIAGNOSIS — R7989 Other specified abnormal findings of blood chemistry: Secondary | ICD-10-CM | POA: Diagnosis not present

## 2024-10-11 HISTORY — DX: Gastro-esophageal reflux disease without esophagitis: K21.9

## 2024-10-11 HISTORY — DX: Personal history of urinary calculi: Z87.442

## 2024-10-11 LAB — HEPATIC FUNCTION PANEL
ALT: 17 U/L (ref 3–35)
AST: 16 U/L (ref 5–37)
Albumin: 3.7 g/dL (ref 3.5–5.2)
Alkaline Phosphatase: 83 U/L (ref 39–117)
Bilirubin, Direct: 0.1 mg/dL (ref 0.1–0.3)
Total Bilirubin: 0.9 mg/dL (ref 0.2–1.2)
Total Protein: 6.7 g/dL (ref 6.0–8.3)

## 2024-10-11 NOTE — Anesthesia Preprocedure Evaluation (Addendum)
 "                                  Anesthesia Evaluation  Patient identified by MRN, date of birth, ID band Patient awake    Reviewed: Allergy & Precautions, NPO status , Patient's Chart, lab work & pertinent test results  History of Anesthesia Complications Negative for: history of anesthetic complications  Airway Mallampati: II  TM Distance: >3 FB Neck ROM: Full    Dental no notable dental hx. (+) Implants, Missing, Caps, Poor Dentition,    Pulmonary    Pulmonary exam normal breath sounds clear to auscultation       Cardiovascular hypertension, Pt. on medications (-) angina (-) Past MI Normal cardiovascular exam Rhythm:Regular Rate:Normal     Neuro/Psych    GI/Hepatic Neg liver ROS, hiatal hernia, PUD,,,  Endo/Other  negative endocrine ROS    Renal/GU Renal diseaseLab Results      Component                Value               Date                                  K                        4.0                 09/29/2024                CO2                      25                  09/29/2024                BUN                      21                  09/29/2024                CREATININE               0.91                09/29/2024                     Musculoskeletal  (+) Arthritis ,    Abdominal   Peds  Hematology Lab Results      Component                Value               Date                      WBC                      4.9                 09/29/2024                HGB  12.4                09/29/2024                HCT                      37.4                09/29/2024                MCV                      88.0                09/29/2024                PLT                      267                 09/29/2024              Anesthesia Other Findings All : See list  Reproductive/Obstetrics                              Anesthesia Physical Anesthesia Plan  ASA: 3  Anesthesia Plan: General    Post-op Pain Management: Minimal or no pain anticipated and Ofirmev  IV (intra-op)*   Induction: Intravenous  PONV Risk Score and Plan: 3 and Treatment may vary due to age or medical condition, Ondansetron , Propofol  infusion and TIVA  Airway Management Planned: LMA  Additional Equipment: None  Intra-op Plan:   Post-operative Plan: Extubation in OR  Informed Consent:      Dental advisory given  Plan Discussed with: CRNA and Surgeon  Anesthesia Plan Comments: (See PAT note from 12/31)         Anesthesia Quick Evaluation  "

## 2024-10-11 NOTE — Progress Notes (Signed)
 " Case: [id number] Date/Time: 10/13/24 1326   Procedures:      CYSTOSCOPY/URETEROSCOPY/HOLMIUM LASER/STENT PLACEMENT (Right)     CYSTOSCOPY, WITH RETROGRADE PYELOGRAM (Right)   Anesthesia type: General   Diagnosis: Ureteral stone [N20.1]   Pre-op diagnosis: RIGHT URETERAL STONE   Location: [facility]   Surgeons: Selma Donnice SAUNDERS, MD       DISCUSSION: Melanie [name] is an 89 yo female with PMH of HTN, PUD, large hiatal hernia, arthritis. Patient with multiple medication allergies.  Prior complication from anesthesia includes memory loss for days after surgery.  Patient admitted from 12/2-12/4/25 for epigastric abdominal pain and complicated UTI with right ureteral stone. She was treated with fluids, abx. EGD was offered by GI but patient declined and she was given IV Protonix .   Patient underwent stent placement with Urology (Dr. [name]) on 12/10. No complications noted.  Readdmited from 12/18-12/19/25 due to headache, emesis, and acute liver injury. GI consulted and felt it was DILI 2/2 Doxycycline  use which was discontinued. She had a MRCP which was negative for acute findings. LFTs trending down at discharge and she was advised to have repeat LFTs in 1 week.  LFTs done on 12/30 came back normal. Anticipate she can proceed  VS: Ht 5' 2 (1.575 m)   Wt 83.5 kg   BMI 33.65 kg/m   PROVIDERS: Pcp, No   LABS: Labs reviewed: Acceptable for surgery. (all labs ordered are listed, but only abnormal results are displayed)  Labs Reviewed - No data to display   MRCP 09/29/24:  IMPRESSION: Prior cholecystectomy. No evidence of biliary ductal dilatation, choledocholithiasis, or other acute findings.   Large hiatal hernia.  EKG 09/12/24:  SR LVH with secondary repolarization abnormality    CV:  Past Medical History:  Diagnosis Date   Allergy    Anemia    Arthritis    right hip is most painful and affects mobility as far as ambulating steps   AVM (arteriovenous  malformation)    of cecum   Cataract    Complication of anesthesia    pt states she had memory problems for several days after   Diverticula of colon    Duodenal ulcer 2015   x 4 nonbleeding   Duodenitis    GERD (gastroesophageal reflux disease)    Hearing loss    Hiatal hernia    History of kidney stones    Hypercalcemia 08/18/2013   Hypertension    Iron  deficiency anemia, unspecified 12/25/2012    Past Surgical History:  Procedure Laterality Date   CATARACT EXTRACTION Bilateral    2008   CHOLECYSTECTOMY     laparoscopic    COLONOSCOPY N/A 12/26/2013   Procedure: COLONOSCOPY;  Surgeon: Gordy CHRISTELLA Starch, MD;  Location: [facility];  Service: Gastroenterology;  Laterality: N/A;   CYSTOSCOPY W/ URETERAL STENT PLACEMENT Right 09/20/2024   Procedure: CYSTOSCOPY, WITH RETROGRADE PYELOGRAM AND RIGHT URETERAL STENT INSERTION;  Surgeon: [name] Valli BIRCH, MD;  Location: [facility];  Service: Urology;  Laterality: Right;   ESOPHAGOGASTRODUODENOSCOPY N/A 12/26/2013   Procedure: ESOPHAGOGASTRODUODENOSCOPY (EGD);  Surgeon: Gordy CHRISTELLA Starch, MD;  Location: [facility];  Service: Gastroenterology;  Laterality: N/A;   GIVENS CAPSULE STUDY N/A 12/26/2013   Procedure: GIVENS CAPSULE STUDY;  Surgeon: Gordy CHRISTELLA Starch, MD;  [facility];  Service: Gastroenterology;  Laterality: N/A;  Due to inablility to swallow capsule, will place Endoscopically into Small Bowel.   HOT HEMOSTASIS N/A 12/26/2013   Procedure: HOT HEMOSTASIS (ARGON PLASMA COAGULATION/BICAP);  Surgeon: Gordy CHRISTELLA Starch, MD;  Location: [facility];  Service: Gastroenterology;  Laterality: N/A;   TONSILLECTOMY      MEDICATIONS:  Cyanocobalamin  (VITAMIN B12) 500 MCG TABS   estradiol (ESTRACE) 0.1 MG/GM vaginal cream   folic acid  (FOLVITE ) 1 MG tablet   metoprolol  (LOPRESSOR ) 50 MG tablet   ondansetron  (ZOFRAN -ODT) 4 MG disintegrating tablet   pantoprazole  (PROTONIX ) 40 MG tablet   phenazopyridine  (PYRIDIUM ) 100 MG tablet   polyethylene  glycol powder (GLYCOLAX /MIRALAX ) powder   Povidone (IVIZIA DRY EYES OP)   tacrolimus (PROTOPIC) 0.1 % ointment   tamsulosin  (FLOMAX ) 0.4 MG CAPS capsule   traMADol  (ULTRAM ) 50 MG tablet   No current facility-administered medications for this encounter.   Burnard CHRISTELLA Odis DEVONNA [facility] Short Stay/[facility] Phone [phone] 10/11/2024 1:02 PM      "

## 2024-10-12 MED ORDER — GENTAMICIN SULFATE 40 MG/ML IJ SOLN
5.0000 mg/kg | Freq: Once | INTRAVENOUS | Status: DC
  Filled 2024-10-12: qty 10.5

## 2024-10-12 MED ORDER — GENTAMICIN SULFATE 40 MG/ML IJ SOLN
5.0000 mg/kg | Freq: Once | INTRAVENOUS | Status: AC
  Administered 2024-10-13: 420 mg via INTRAVENOUS
  Filled 2024-10-12: qty 10.5

## 2024-10-13 ENCOUNTER — Encounter (HOSPITAL_COMMUNITY): Payer: Self-pay | Admitting: Urology

## 2024-10-13 ENCOUNTER — Ambulatory Visit (HOSPITAL_COMMUNITY)

## 2024-10-13 ENCOUNTER — Ambulatory Visit (HOSPITAL_COMMUNITY)
Admission: RE | Admit: 2024-10-13 | Discharge: 2024-10-13 | Disposition: A | Discharge status: Home / Self Care | Attending: Urology | Admitting: Urology

## 2024-10-13 ENCOUNTER — Ambulatory Visit (HOSPITAL_COMMUNITY): Admitting: Medical

## 2024-10-13 ENCOUNTER — Ambulatory Visit (HOSPITAL_COMMUNITY): Admitting: Anesthesiology

## 2024-10-13 ENCOUNTER — Encounter (HOSPITAL_COMMUNITY)
Admission: RE | Disposition: A | Discharge status: Home / Self Care | Payer: Self-pay | Source: Home / Self Care | Attending: Urology

## 2024-10-13 ENCOUNTER — Other Ambulatory Visit: Payer: Self-pay

## 2024-10-13 DIAGNOSIS — N202 Calculus of kidney with calculus of ureter: Secondary | ICD-10-CM | POA: Insufficient documentation

## 2024-10-13 DIAGNOSIS — N200 Calculus of kidney: Secondary | ICD-10-CM

## 2024-10-13 DIAGNOSIS — I10 Essential (primary) hypertension: Secondary | ICD-10-CM

## 2024-10-13 HISTORY — PX: CYSTOSCOPY W/ RETROGRADES: SHX1426

## 2024-10-13 HISTORY — PX: CYSTOSCOPY/URETEROSCOPY/HOLMIUM LASER/STENT PLACEMENT: SHX6546

## 2024-10-13 SURGERY — CYSTOSCOPY/URETEROSCOPY/HOLMIUM LASER/STENT PLACEMENT
Anesthesia: General | Site: Ureter | Laterality: Right

## 2024-10-13 MED ORDER — PROPOFOL 500 MG/50ML IV EMUL
INTRAVENOUS | Status: DC | PRN
  Administered 2024-10-13: 125 ug/kg/min via INTRAVENOUS

## 2024-10-13 MED ORDER — PHENYLEPHRINE 80 MCG/ML (10ML) SYRINGE FOR IV PUSH (FOR BLOOD PRESSURE SUPPORT)
PREFILLED_SYRINGE | INTRAVENOUS | Status: AC
  Filled 2024-10-13: qty 10

## 2024-10-13 MED ORDER — FENTANYL CITRATE (PF) 100 MCG/2ML IJ SOLN
INTRAMUSCULAR | Status: AC
  Filled 2024-10-13: qty 2

## 2024-10-13 MED ORDER — CHLORHEXIDINE GLUCONATE 0.12 % MT SOLN
15.0000 mL | Freq: Once | OROMUCOSAL | Status: AC
  Administered 2024-10-13: 15 mL via OROMUCOSAL

## 2024-10-13 MED ORDER — PHENYLEPHRINE 80 MCG/ML (10ML) SYRINGE FOR IV PUSH (FOR BLOOD PRESSURE SUPPORT)
PREFILLED_SYRINGE | INTRAVENOUS | Status: DC | PRN
  Administered 2024-10-13: 160 ug via INTRAVENOUS

## 2024-10-13 MED ORDER — LIDOCAINE HCL (PF) 2 % IJ SOLN
INTRAMUSCULAR | Status: AC
  Filled 2024-10-13: qty 5

## 2024-10-13 MED ORDER — LIDOCAINE HCL (PF) 2 % IJ SOLN
INTRAMUSCULAR | Status: DC | PRN
  Administered 2024-10-13: 100 mg via INTRADERMAL

## 2024-10-13 MED ORDER — PROPOFOL 10 MG/ML IV BOLUS
INTRAVENOUS | Status: DC | PRN
  Administered 2024-10-13: 10 mg via INTRAVENOUS
  Administered 2024-10-13: 20 mg via INTRAVENOUS
  Administered 2024-10-13: 140 mg via INTRAVENOUS

## 2024-10-13 MED ORDER — FENTANYL CITRATE (PF) 100 MCG/2ML IJ SOLN
INTRAMUSCULAR | Status: DC | PRN
  Administered 2024-10-13 (×2): 25 ug via INTRAVENOUS

## 2024-10-13 MED ORDER — ACETAMINOPHEN 10 MG/ML IV SOLN
1000.0000 mg | Freq: Once | INTRAVENOUS | Status: DC | PRN
  Administered 2024-10-13: 1000 mg via INTRAVENOUS

## 2024-10-13 MED ORDER — ONDANSETRON HCL 4 MG/2ML IJ SOLN: 4.0000 mg | Freq: Once | INTRAMUSCULAR | Status: DC | PRN

## 2024-10-13 MED ORDER — ONDANSETRON HCL 4 MG/2ML IJ SOLN
INTRAMUSCULAR | Status: AC
  Filled 2024-10-13: qty 2

## 2024-10-13 MED ORDER — PROPOFOL 1000 MG/100ML IV EMUL
INTRAVENOUS | Status: AC
  Filled 2024-10-13: qty 100

## 2024-10-13 MED ORDER — FENTANYL CITRATE (PF) 50 MCG/ML IJ SOSY
PREFILLED_SYRINGE | INTRAMUSCULAR | Status: AC
  Filled 2024-10-13: qty 1

## 2024-10-13 MED ORDER — DEXAMETHASONE SOD PHOSPHATE PF 10 MG/ML IJ SOLN
INTRAMUSCULAR | Status: DC | PRN
  Administered 2024-10-13: 4 mg via INTRAVENOUS

## 2024-10-13 MED ORDER — FENTANYL CITRATE (PF) 50 MCG/ML IJ SOSY
25.0000 ug | PREFILLED_SYRINGE | INTRAMUSCULAR | Status: DC | PRN
  Administered 2024-10-13 (×2): 50 ug via INTRAVENOUS

## 2024-10-13 MED ORDER — LACTATED RINGERS IV SOLN: INTRAVENOUS | Status: DC

## 2024-10-13 MED ORDER — OXYCODONE-ACETAMINOPHEN 5-325 MG PO TABS: 1.0000 | ORAL_TABLET | ORAL | 0 refills | Status: AC | PRN

## 2024-10-13 MED ORDER — ACETAMINOPHEN 10 MG/ML IV SOLN
INTRAVENOUS | Status: AC
  Filled 2024-10-13: qty 100

## 2024-10-13 MED ORDER — 0.9 % SODIUM CHLORIDE (POUR BTL) OPTIME
TOPICAL | Status: DC | PRN
  Administered 2024-10-13: 1000 mL

## 2024-10-13 MED ORDER — SODIUM CHLORIDE 0.9 % IR SOLN
Status: DC | PRN
  Administered 2024-10-13: 3000 mL via INTRAVESICAL

## 2024-10-13 MED ORDER — ONDANSETRON HCL 4 MG/2ML IJ SOLN
INTRAMUSCULAR | Status: DC | PRN
  Administered 2024-10-13: 4 mg via INTRAVENOUS

## 2024-10-13 MED ORDER — IOHEXOL 300 MG/ML  SOLN
INTRAMUSCULAR | Status: DC | PRN
  Administered 2024-10-13: 3 mL

## 2024-10-13 SURGICAL SUPPLY — 24 items
BAG URO CATCHER STRL LF (MISCELLANEOUS) ×1 IMPLANT
BASKET ZERO TIP NITINOL 2.4FR (BASKET) IMPLANT
BENZOIN TINCTURE PRP APPL 2/3 (GAUZE/BANDAGES/DRESSINGS) IMPLANT
CATH URETERAL DUAL LUMEN 10F (MISCELLANEOUS) IMPLANT
CATH URETL OPEN 5X70 (CATHETERS) IMPLANT
CATH URETL OPEN END 6FR 70 (CATHETERS) IMPLANT
CLOTH BEACON ORANGE TIMEOUT ST (SAFETY) ×1 IMPLANT
DRSG TEGADERM 2-3/8X2-3/4 SM (GAUZE/BANDAGES/DRESSINGS) IMPLANT
FIBER LASER MOSES 200 DFL (Laser) IMPLANT
GLOVE BIOGEL M 7.0 STRL (GLOVE) ×1 IMPLANT
GOWN STRL REUS W/ TWL XL LVL3 (GOWN DISPOSABLE) ×1 IMPLANT
GUIDEWIRE STR DUAL SENSOR (WIRE) ×2 IMPLANT
GUIDEWIRE ZIPWRE .038 STRAIGHT (WIRE) IMPLANT
KIT TURNOVER KIT A (KITS) ×1 IMPLANT
MANIFOLD NEPTUNE II (INSTRUMENTS) ×1 IMPLANT
NS IRRIG 1000ML POUR BTL (IV SOLUTION) IMPLANT
PACK CYSTO (CUSTOM PROCEDURE TRAY) ×1 IMPLANT
PAD PREP 24X48 CUFFED NSTRL (MISCELLANEOUS) ×1 IMPLANT
SHEATH DILATOR SET 8/10 (MISCELLANEOUS) IMPLANT
SHEATH NAVIGATOR HD 11/13X28 (SHEATH) IMPLANT
SHEATH NAVIGATOR HD 11/13X36 (SHEATH) IMPLANT
STENT URET 6FRX24 CONTOUR (STENTS) IMPLANT
TUBING CONNECTING 10 (TUBING) ×1 IMPLANT
TUBING UROLOGY SET (TUBING) ×1 IMPLANT

## 2024-10-13 NOTE — Anesthesia Postprocedure Evaluation (Signed)
"   Anesthesia Post Note  Patient: Melanie [name]  Procedure(s) Performed: CYSTOSCOPY/URETEROSCOPY/HOLMIUM LASER/STENT PLACEMENT (Right: Ureter) CYSTOSCOPY, WITH RETROGRADE PYELOGRAM (Right: Ureter)     Patient location during evaluation: Endoscopy Anesthesia Type: General Level of consciousness: awake and alert Pain management: pain level controlled Vital Signs Assessment: post-procedure vital signs reviewed and stable Respiratory status: spontaneous breathing, nonlabored ventilation, respiratory function stable and patient connected to nasal cannula oxygen Cardiovascular status: stable and blood pressure returned to baseline Postop Assessment: no apparent nausea or vomiting Anesthetic complications: no   No notable events documented.  Last Vitals:  Vitals:   10/13/24 0945 10/13/24 1000  BP: 127/70 125/75  Pulse: 73 80  Resp: 14 17  Temp:  36.6 C  SpO2: 99% 93%    Last Pain:  Vitals:   10/13/24 1000  TempSrc:   PainSc: 4                  [address]      "

## 2024-10-13 NOTE — Op Note (Signed)
 Operative Note  Preoperative diagnosis:  1. Right ureteral and renal  stone  Postoperative diagnosis: 1.  Right renal stone  Procedure(s): 1.  Cystoscopy 2. Right ureteroscopy with laser lithotripsy and basket extraction of stones 3. Right retrograde pyelogram 4. Right ureteral stent exchange 5. Fluoroscopy with intraoperative interpretation  Surgeon: Donnice Siad, MD  Assistants:  None  Anesthesia:  General  Complications:  None  EBL:  Minimal  Specimens: 1. Stones for stone analysis (to be done at [facility])  Drains/Catheters: 1.  Right 6Fr x 24 cm ureteral stent with tether string  Intraoperative findings:   Cystoscopy demonstrated no suspicious bladder lesions. Right retrograde pyelogram revealed no hydronephrosis. Right ureteroscopy demonstrated right renal stone about 1cm in the renal pelvis that was dusted successfully with no larger stone fragments remaining. Successful stent placement.  Indication:  Melanie [name] is a 89 y.o. female with a large right renal pelvis stone here for right URS/LL, R stent exchange.  Description of procedure: After informed consent was obtained from the patient, the patient was identified and taken to the operating room and placed in the supine position.  General anesthesia was administered as well as perioperative IV antibiotics.  At the beginning of the case, a time-out was performed to properly identify the patient, the surgery to be performed, and the surgical site.  Sequential compression devices were applied to the lower extremities at the beginning of the case for DVT prophylaxis.  The patient was then placed in the dorsal lithotomy supine position, prepped and draped in sterile fashion.  Preliminary scout fluoroscopy revealed that there was a 1cm calcification area at the right renal pelvis, which corresponds to the stone found on the preoperative CT scan. We then passed the 21-French rigid cystoscope through the urethra  and into the bladder under vision without any difficulty, noting a normal urethra without strictures.  A systematic evaluation of the bladder revealed no evidence of any suspicious bladder lesions.  Ureteral orifices were in normal position.    The distal aspect of the ureteral stent was seen protruding from the right ureteral orifice.  We then used the alligator-tooth forceps and grasped the distal end of the ureteral stent and brought it out the urethral meatus while watching the proximal coil straighten out nicely on fluoroscopy. Through the ureteral stent, we then passed a 0.038 sensor wire up to the level of the renal pelvis.  The ureteral stent was then removed, leaving the sensor wire up the right ureter.    Under cystoscopic and flouroscopic guidance, we cannulated the right ureteral orifice with a 5-French open-ended ureteral catheter and a gentle retrograde pyelogram was performed, revealing a normal caliber ureter without any filling defects. There was no hydronephrosis of the collecting system. There was a 1cm filling defect in the right renal pelvis corresponding to the stone. A 0.038 sensor wire was then passed up to the level of the renal pelvis and secured to the drape as a safety wire. The ureteral catheter and cystoscope were removed, leaving the safety wire in place.   A semi-rigid ureteroscope was passed alongside the wire up the distal ureter which appeared normal. A second 0.038 sensor wire was passed under direct vision and the semirigid scope was removed. A 11/13Fr ureteral access sheath was carefully advanced up the ureter to the level of the UPJ over this wire under fluoroscopic guidance. The flexible ureteroscope was advanced into the collecting system via the access sheath. The collecting system was inspected. The calculus  was identified at the right renal pelvis. Using the 272 micron holmium laser fiber, the stone was dusted completely. A 2.2 Fr zero tip basket was used to remove  the fragments under visual guidance. These were sent for chemical analysis. With the ureteroscope in the kidney, a gentle pyelogram was performed to delineate the calyceal system and we evaluated the calyces systematically. We encountered no other fragments. The rest of the stone fragments were very tiny and these were  irrigated away gently. The calyces were re-inspected and there were no significant stone fragment residual.   We then withdrew the ureteroscope back down the ureter along with the access sheath, noting no evidence of any stones along the course of the ureter.  Prior to removing the ureteroscope, we did pass the Glidewire back up to the ureter to the renal pelvis.  Once the ureteroscope was removed, we then used the Glidewire under fluoroscopic guidance and passed up a 6-French x 24 cm double-pigtail ureteral stent up the ureter, making sure that the proximal and distal ends coiled within the kidney and bladder respectively.  Note that we left a long tether string attached to the distal end of the ureteral stent and it exited the urethral meatus and was secured to the inner thigh with a tegaderm adhesive.  The cystoscope was then advanced back into the bladder under vision.  We were able to see the distal stent coiling nicely within the bladder.  The bladder was then emptied with irrigation solution.  The cystoscope was then removed.    The patient tolerated the procedure well and there was no complication. Patient was awoken from anesthesia and taken to the recovery room in stable condition. I was present and scrubbed for the entirety of the case.  Plan:  Patient will be discharged home.  Follow up with me in one month with renal ultrasound.  Matt R. Karleigh Bunte MD [facility]  Pager: [phone]

## 2024-10-13 NOTE — Discharge Instructions (Signed)
 [facility] [phone] Post Ureteroscopy With or Without Stent Instructions  Definitions:  Ureter: The duct that transports urine from the kidney to the bladder. Stent:   A plastic hollow tube that is placed into the ureter, from the kidney to the bladder to prevent the ureter from swelling shut.  GENERAL INSTRUCTIONS:  Despite the fact that no skin incisions were used, the area around the ureter and bladder is raw and irritated. The stent is a foreign body which will further irritate the bladder wall. This irritation is manifested by increased frequency of urination, both day and night, and by an increase in the urge to urinate. In some, the urge to urinate is present almost always. Sometimes the urge is strong enough that you may not be able to stop yourself from urinating. The only real cure is to remove the stent and then give time for the bladder wall to heal which can't be done until the danger of the ureter swelling shut has passed, which varies.  You may see some blood in your urine while the stent is in place and a few days afterwards. Do not be alarmed, even if the urine was clear for a while. Get off your feet and drink lots of fluids until clearing occurs. If you start to pass clots or don't improve, call us.  DIET: You may return to your normal diet immediately. Because of the raw surface of your bladder, alcohol, spicy foods, acid type foods and drinks with caffeine may cause irritation or frequency and should be used in moderation. To keep your urine flowing freely and to avoid constipation, drink plenty of fluids during the day ( 8-10 glasses ). Tip: Avoid cranberry juice because it is very acidic.  ACTIVITY: Your physical activity doesn't need to be restricted. However, if you are very active, you may see some blood in your urine. We suggest that you reduce your activity under these circumstances until the bleeding has stopped.  BOWELS: It is important to  keep your bowels regular during the postoperative period. Straining with bowel movements can cause bleeding. A bowel movement every other day is reasonable. Use a mild laxative if needed, such as Milk of Magnesia 2-3 tablespoons, or 2 Dulcolax tablets. Call if you continue to have problems. If you have been taking narcotics for pain, before, during or after your surgery, you may be constipated. Take a laxative if necessary.   MEDICATION: You should resume your pre-surgery medications unless told not to. In addition you will often be given an antibiotic to prevent infection. These should be taken as prescribed until the bottles are finished unless you are having an unusual reaction to one of the drugs.  PROBLEMS YOU SHOULD REPORT TO [place]: Fevers over 100.5 Fahrenheit. Heavy bleeding, or clots ( See above notes about blood in urine ). Inability to urinate. Drug reactions ( hives, rash, nausea, vomiting, diarrhea ). Severe burning or pain with urination that is not improving.  FOLLOW-UP: You will need a follow-up appointment to monitor your progress. Call for this appointment at the number listed above. Usually the first appointment will be about three to fourteen days after your surgery.  You have a stent draining your kidney.  This may be removed in 3 days by pulling on attached string.

## 2024-10-13 NOTE — Anesthesia Procedure Notes (Signed)
 Procedure Name: LMA Insertion Date/Time: 10/13/2024 7:39 AM  Performed by: Carleton Garnette SAUNDERS, CRNAPre-anesthesia Checklist: Patient identified, Emergency Drugs available, Suction available, Patient being monitored and Timeout performed Patient Re-evaluated:Patient Re-evaluated prior to induction Oxygen Delivery Method: Circle system utilized Preoxygenation: Pre-oxygenation with 100% oxygen Induction Type: IV induction LMA: LMA inserted LMA Size: 4.0 Tube type: Oral Number of attempts: 1 Placement Confirmation: positive ETCO2 and breath sounds checked- equal and bilateral Tube secured with: Tape Dental Injury: Teeth and Oropharynx as per pre-operative assessment

## 2024-10-13 NOTE — H&P (Addendum)
 " Urology Preoperative H&P   Chief Complaint: Right ureteral and renal stones  History of Present Illness: Melanie [name] is a 89 y.o. female with right ureteral and renal stones here for right URS/LL, right stent placement. Denies fevers, chills, dysuria. Preop Ucx NG.  Past Medical History:  Diagnosis Date   Allergy    Anemia    Arthritis    right hip is most painful and affects mobility as far as ambulating steps   AVM (arteriovenous malformation)    of cecum   Cataract    Complication of anesthesia    pt states she had memory problems for several days after   Diverticula of colon    Duodenal ulcer 2015   x 4 nonbleeding   Duodenitis    GERD (gastroesophageal reflux disease)    Hearing loss    Hiatal hernia    History of kidney stones    Hypercalcemia 08/18/2013   Hypertension    Iron  deficiency anemia, unspecified 12/25/2012    Past Surgical History:  Procedure Laterality Date   CATARACT EXTRACTION Bilateral    2008   CHOLECYSTECTOMY     laparoscopic    COLONOSCOPY N/A 12/26/2013   Procedure: COLONOSCOPY;  Surgeon: Gordy CHRISTELLA Starch, MD;  Location: [facility];  Service: Gastroenterology;  Laterality: N/A;   CYSTOSCOPY W/ URETERAL STENT PLACEMENT Right 09/20/2024   Procedure: CYSTOSCOPY, WITH RETROGRADE PYELOGRAM AND RIGHT URETERAL STENT INSERTION;  Surgeon: [name] Valli BIRCH, MD;  Location: [facility];  Service: Urology;  Laterality: Right;   ESOPHAGOGASTRODUODENOSCOPY N/A 12/26/2013   Procedure: ESOPHAGOGASTRODUODENOSCOPY (EGD);  Surgeon: Gordy CHRISTELLA Starch, MD;  Location: [facility];  Service: Gastroenterology;  Laterality: N/A;   GIVENS CAPSULE STUDY N/A 12/26/2013   Procedure: GIVENS CAPSULE STUDY;  Surgeon: Gordy CHRISTELLA Starch, MD;  Location: [facility];  Service: Gastroenterology;  Laterality: N/A;  Due to inablility to swallow capsule, will place Endoscopically into Small Bowel.   HOT HEMOSTASIS N/A 12/26/2013   Procedure: HOT HEMOSTASIS (ARGON PLASMA COAGULATION/BICAP);   Surgeon: Gordy CHRISTELLA Starch, MD;  Location: [facility];  Service: Gastroenterology;  Laterality: N/A;   TONSILLECTOMY      Allergies: Allergies[1]  Family History  Problem Relation Age of Onset   Asthma Other    Hypertension Other    Hyperlipidemia Other    Stroke Other    Diabetes Mother    Heart disease Mother    Diverticulosis Mother    Heart disease Father    Breast cancer Daughter 81   Colon cancer Neg Hx    Rectal cancer Neg Hx    Stomach cancer Neg Hx    Esophageal cancer Neg Hx     Social History:  reports that she has never smoked. She has never used smokeless tobacco. She reports that she does not drink alcohol and does not use drugs.  ROS: A complete review of systems was performed.  All systems are negative except for pertinent findings as noted.  Physical Exam:  Vital signs in last 24 hours: Temp:  [98.4 F (36.9 C)] 98.4 F (36.9 C) (01/02 0558) Pulse Rate:  [83] 83 (01/02 0558) Resp:  [18] 18 (01/02 0558) BP: (144)/(79) 144/79 (01/02 0558) SpO2:  [95 %] 95 % (01/02 0558) Weight:  [83.5 kg] 83.5 kg (01/02 9362) Constitutional:  Alert and oriented, No acute distress Cardiovascular: Regular rate and rhythm Respiratory: Normal respiratory effort, Lungs clear bilaterally GI: Abdomen is soft, nontender, nondistended, no abdominal masses GU: No CVA tenderness Lymphatic: No lymphadenopathy Neurologic: Grossly  intact, no focal deficits Psychiatric: Normal mood and affect  Laboratory Data:  No results for input(s): WBC, HGB, HCT, PLT in the last 72 hours.  No results for input(s): NA, K, CL, GLUCOSE, BUN, CALCIUM , CREATININE in the last 72 hours.  Invalid input(s): CO3   No results found for this or any previous visit (from the past 24 hours). No results found for this or any previous visit (from the past 240 hours).  Renal Function: No results for input(s): CREATININE in the last 168 hours. Estimated Creatinine Clearance: 43.7  mL/min (by C-G formula based on SCr of 0.91 mg/dL).  Radiologic Imaging: No results found.  I independently reviewed the above imaging studies.  Assessment and Plan Melanie [name] is a 89 y.o. female with right URS/LL, stent placement.  -The risks, benefits and alternatives of cystoscopy with right URS/LL JJ stent placement was discussed with the patient.  Risks include, but are not limited to: bleeding, urinary tract infection, ureteral injury, ureteral stricture disease, chronic pain, urinary symptoms, bladder injury, stent migration, the need for nephrostomy tube placement, MI, CVA, DVT, PE and the inherent risks with general anesthesia.  The patient voices understanding and wishes to proceed.     Matt R. Shaketa Serafin MD 10/13/2024, 7:29 AM  [facility] Pager: [phone]): [phone]     [1]  Allergies Allergen Reactions   Cephalexin Other (See Comments)    Affected pt's child -  Therefore , pt does not take Keflex.   Clindamycin/Lincomycin    Codeine Nausea And Vomiting   Doxycycline      Suspected DILI liver injury 09/28/2024   Levofloxacin Other (See Comments)    Hallucinations.   Morphine  And Codeine Nausea And Vomiting   Other     Another antibiotic that starts with cina?   Quinolones Other (See Comments)    Hallucinations.   Sulfa Antibiotics Nausea And Vomiting   Tramadol  Nausea And Vomiting   Amoxicillin Nausea Only   Fexofenadine Hcl Other (See Comments)    unknown reaction   Floxin [Ocuflox] Other (See Comments)    unknown allergy   Neurontin [Gabapentin] Palpitations   Septra [Bactrim] Other (See Comments)    Unknown reaction   "

## 2024-10-13 NOTE — Transfer of Care (Signed)
 Immediate Anesthesia Transfer of Care Note  Patient: Melanie [name]  Procedure(s) Performed: CYSTOSCOPY/URETEROSCOPY/HOLMIUM LASER/STENT PLACEMENT (Right: Ureter) CYSTOSCOPY, WITH RETROGRADE PYELOGRAM (Right: Ureter)  Patient Location: PACU  Anesthesia Type:General  Level of Consciousness: sedated  Airway & Oxygen Therapy: Patient Spontanous Breathing and Patient connected to face mask oxygen  Post-op Assessment: Report given to RN and Post -op Vital signs reviewed and stable  Post vital signs: Reviewed and stable  Last Vitals:  Vitals Value Taken Time  BP    Temp    Pulse 78 10/13/24 08:41  Resp 19 10/13/24 08:41  SpO2 100 % 10/13/24 08:41  Vitals shown include unfiled device data.  Last Pain:  Vitals:   10/13/24 0637  TempSrc:   PainSc: 0-No pain         Complications: No notable events documented.

## 2024-10-14 ENCOUNTER — Encounter (HOSPITAL_COMMUNITY): Payer: Self-pay | Admitting: Urology

## 2024-10-24 LAB — STONE ANALYSIS
Calcium Oxalate Dihydrate: 95 %
Calcium Phosphate (Hydroxyl): 5 %
Weight Calculi: 11 mg

## 2024-10-26 ENCOUNTER — Telehealth: Payer: Self-pay | Admitting: Gastroenterology

## 2024-10-26 NOTE — Telephone Encounter (Signed)
 PT is calling to speak to a nurse regarding her symptoms. She stated that she had a change in bowels. At first she had an orange colored soft stool and now it is just hard and crumbly and she wants to know can she continue the pantoprazole . Please advise.

## 2024-10-26 NOTE — Telephone Encounter (Signed)
 Spoke to patient who states that she had MRCP recently during a hospitalization and was told to follow with GI about it. MRCP was reviewed by Dr Albertus and LFT's were repeated. Those returned normal and patient was advised no further follow up needed in regards to previous drug induced liver injury.  Patient states that she was also given pantoprazole  by our office but started having a rash and stopped it. Wanted to know if she should restart. I advised that she should not restart pantoprazole  if causing rashes. When she was at our office in 2022, she was advised to take pepcid  daily. Advised she can and should restart pepcid  daily due to her previous history of PUD.  Additionally, patient complains of constipation over the last couple of days, describing hard, crumbly stools. She says she takes miralax  which normally works well but has not over the last 2 days. She is advised that she may titrate miralax  according to her bowel movements. She can increase to 1 capful twice daily dosing and increase water  consumption to at least 64 oz daily.   Patient scheduled an appointment with Dr Albertus 12/28/24 (next available) as she prefers to see him only and has requested to follow up after hospitalization.

## 2024-10-26 NOTE — Telephone Encounter (Signed)
 Dr Bridgett Camps pt

## 2024-12-28 ENCOUNTER — Ambulatory Visit: Admitting: Internal Medicine

## 2025-01-03 ENCOUNTER — Ambulatory Visit (INDEPENDENT_AMBULATORY_CARE_PROVIDER_SITE_OTHER): Admitting: Otolaryngology
